# Patient Record
Sex: Female | Born: 1950 | Race: White | Hispanic: No | Marital: Married | Smoking: Never smoker
Health system: Southern US, Community
[De-identification: ages and names within clinical notes are randomized; demographics above are authoritative.]

## PROBLEM LIST (undated history)

## (undated) DIAGNOSIS — I10 Essential (primary) hypertension: Secondary | ICD-10-CM

---

## 1998-06-26 ENCOUNTER — Other Ambulatory Visit: Admission: RE | Admit: 1998-06-26 | Discharge: 1998-06-26 | Payer: Self-pay | Admitting: Obstetrics and Gynecology

## 1998-12-02 ENCOUNTER — Encounter: Payer: Self-pay | Admitting: Obstetrics and Gynecology

## 1998-12-02 ENCOUNTER — Ambulatory Visit (HOSPITAL_COMMUNITY): Admission: RE | Admit: 1998-12-02 | Discharge: 1998-12-02 | Payer: Self-pay | Admitting: Obstetrics and Gynecology

## 2000-06-15 ENCOUNTER — Ambulatory Visit (HOSPITAL_COMMUNITY): Admission: RE | Admit: 2000-06-15 | Discharge: 2000-06-15 | Payer: Self-pay | Admitting: Obstetrics and Gynecology

## 2000-06-15 ENCOUNTER — Encounter: Payer: Self-pay | Admitting: Obstetrics and Gynecology

## 2000-06-17 ENCOUNTER — Encounter: Payer: Self-pay | Admitting: Gynecology

## 2000-06-17 ENCOUNTER — Encounter: Admission: RE | Admit: 2000-06-17 | Discharge: 2000-06-17 | Payer: Self-pay | Admitting: Gynecology

## 2004-12-01 ENCOUNTER — Other Ambulatory Visit: Admission: RE | Admit: 2004-12-01 | Discharge: 2004-12-01 | Payer: Self-pay | Admitting: *Deleted

## 2005-12-02 ENCOUNTER — Other Ambulatory Visit: Admission: RE | Admit: 2005-12-02 | Discharge: 2005-12-02 | Payer: Self-pay | Admitting: *Deleted

## 2007-01-14 ENCOUNTER — Other Ambulatory Visit: Admission: RE | Admit: 2007-01-14 | Discharge: 2007-01-14 | Payer: Self-pay | Admitting: *Deleted

## 2009-09-04 ENCOUNTER — Encounter: Admission: RE | Admit: 2009-09-04 | Discharge: 2009-09-04 | Payer: Self-pay | Admitting: Sports Medicine

## 2009-10-23 ENCOUNTER — Encounter: Admission: RE | Admit: 2009-10-23 | Discharge: 2009-10-23 | Payer: Self-pay | Admitting: Sports Medicine

## 2013-09-17 ENCOUNTER — Emergency Department (HOSPITAL_COMMUNITY)
Admission: EM | Admit: 2013-09-17 | Discharge: 2013-09-17 | Disposition: A | Discharge status: Home / Self Care | Payer: BC Managed Care – PPO | Attending: Emergency Medicine | Admitting: Emergency Medicine

## 2013-09-17 ENCOUNTER — Encounter (HOSPITAL_COMMUNITY): Payer: Self-pay | Admitting: Emergency Medicine

## 2013-09-17 DIAGNOSIS — H579 Unspecified disorder of eye and adnexa: Secondary | ICD-10-CM | POA: Insufficient documentation

## 2013-09-17 DIAGNOSIS — Z880 Allergy status to penicillin: Secondary | ICD-10-CM | POA: Insufficient documentation

## 2013-09-17 DIAGNOSIS — I10 Essential (primary) hypertension: Secondary | ICD-10-CM | POA: Insufficient documentation

## 2013-09-17 DIAGNOSIS — H5789 Other specified disorders of eye and adnexa: Secondary | ICD-10-CM

## 2013-09-17 DIAGNOSIS — T7840XA Allergy, unspecified, initial encounter: Secondary | ICD-10-CM

## 2013-09-17 NOTE — ED Provider Notes (Signed)
CSN: [id number]     Arrival date & time 09/17/13  1112 History   This chart was scribed for non-physician practitioner Junius Finner, PA-C, working with Shanna Cisco, MD by Dorothey Baseman, ED Scribe. This patient was seen in room TR04C/TR04C and the patient's care was started at 11:44 AM.    Chief Complaint  Patient presents with  . Eye Pain   The history is provided by the patient. No language interpreter was used.   HPI Comments: Brenda Lloyd is a 62 y.o. female who presents to the Emergency Department complaining of an itching left eye pain with associated drainage, redness, and swelling onset 3 days ago that she states may be slightly improving. She reports that she was seen by her PCP for a viral infection, including flu-like symptoms, 6 days ago and that the eye pain presented after the infection. She states that she was seen 3 days ago for the eye symptoms and was told that she had conjunctivitis and was given bacitracin and permetherin. She reports associated fever of 102-103 degrees yesterday. She reports mild epistaxis and mild nausea yesterday that she believes may be due to the doxycycline. She denies any vision changes. She reports that she normally wears contact lenses, but has not been wearing them since onset of symptoms. She reports that she does have an ophthalmologist and follows up approximately once a year.   History reviewed. No pertinent past medical history. History reviewed. No pertinent past surgical history. History reviewed. No pertinent family history. History  Substance Use Topics  . Smoking status: Never Smoker   . Smokeless tobacco: Not on file  . Alcohol Use: Yes     Comment: occ   OB History   Grav Para Term Preterm Abortions TAB SAB Ect Mult Living                 Review of Systems  Constitutional: Positive for fever.  HENT: Positive for nosebleeds.   Eyes: Positive for pain and discharge. Negative for visual disturbance.  Gastrointestinal:  Positive for nausea.  All other systems reviewed and are negative.    Allergies  Amoxicillin; Ciprofloxacin; Penicillins; and Sulfa antibiotics  Home Medications  No current outpatient prescriptions on file.  Triage Vitals: BP 136/76  Pulse 87  Temp(Src) 98.1 F (36.7 C) (Oral)  Resp 16  SpO2 97%  Physical Exam  Nursing note and vitals reviewed. Constitutional: She is oriented to person, place, and time. She appears well-developed and well-nourished.  HENT:  Head: Normocephalic and atraumatic.  Eyes: EOM are normal. Pupils are equal, round, and reactive to light. Left eye exhibits chemosis and discharge ( scant, yellow). Left conjunctiva is injected. Left conjunctiva has a hemorrhage. Left eye exhibits normal extraocular motion.  Mild preorbital swelling to the left eye with erythema. No tenderness to palpation. No induration.   Neck: Normal range of motion.  Cardiovascular: Normal rate.   Pulmonary/Chest: Effort normal.  Musculoskeletal: Normal range of motion.  Neurological: She is alert and oriented to person, place, and time.  Skin: Skin is warm and dry.  Psychiatric: She has a normal mood and affect. Her behavior is normal.    ED Course  Procedures (including critical care time)  DIAGNOSTIC STUDIES: Oxygen Saturation is 97% on room air, normal by my interpretation.    COORDINATION OF CARE: 11:50AM- I consulted with Dr. Micheline Maze who agrees to consult with ophthalmology. Advised patient to follow up with her ophthalmologist, especially if there are any new or worsening  symptoms. Discussed treatment plan with patient at bedside and patient verbalized agreement.   12:29PM- Discussed that on consultation with ophthalmologist, Dr. Delaney Meigs, we both agree that symptoms are likely due to an allergic reaction to the antibiotic eye drops. Advised patient to discontinue use of the antibiotic eye drops. Advised patient to use chilled "Refresh" every 1-2 hours and to apply cold  compresses to the area to manage discomfort. Advised patient to follow up with Dr. Delaney Meigs tomorrow morning at 8:00AM. Discussed treatment plan with patient at bedside and patient verbalized agreement.    Labs Review Labs Reviewed - No data to display Imaging Review No results found.  MDM   1. Allergic reaction, initial encounter   2. Irritation of left eye     I personally performed the services described in this documentation, which was scribed in my presence. The recorded information has been reviewed and is accurate.   Junius Finner, PA-C 09/17/13 1631

## 2013-09-17 NOTE — ED Notes (Signed)
Pt c/o left eye pain, redness and swelling x 3 days; pt sts some drainage; pt sts some fevers

## 2013-09-17 NOTE — ED Provider Notes (Signed)
Medical screening examination/treatment/procedure(s) were conducted as a shared visit with non-physician practitioner(s) and myself.  I personally evaluated the patient during the encounter  Pt is a 62 y.o. female with Pmhx as above who presents with improving URI symptoms, now with L eye swelling/itching for several days not improved by abx gtt then ointment.  On PE, VSS, pt in NAD.  nml EOM.  PERRL.  She has severe chemosis with likely overlying subconjunctival hemorrhage.  Lids are edematous, but not infected appearing.  I believe she may had a chemical chemosis and pt reports multiple abx allergies.  Given severity, have asked PA Gershon Mussel to speak w/ opthalmology.  Close outpt f/u arranged.    1. Allergic reaction, initial encounter   2. Irritation of left eye       Shanna Cisco, MD 09/17/13 1717

## 2017-09-04 ENCOUNTER — Inpatient Hospital Stay (HOSPITAL_BASED_OUTPATIENT_CLINIC_OR_DEPARTMENT_OTHER)
Admission: EM | Admit: 2017-09-04 | Discharge: 2017-09-10 | DRG: 280 | Disposition: A | Discharge status: Rehabilitation Facility | Payer: Medicare Other | Attending: Cardiovascular Disease | Admitting: Cardiovascular Disease

## 2017-09-04 ENCOUNTER — Encounter (HOSPITAL_BASED_OUTPATIENT_CLINIC_OR_DEPARTMENT_OTHER): Payer: Self-pay | Admitting: Emergency Medicine

## 2017-09-04 ENCOUNTER — Emergency Department (HOSPITAL_BASED_OUTPATIENT_CLINIC_OR_DEPARTMENT_OTHER): Payer: Medicare Other

## 2017-09-04 DIAGNOSIS — F329 Major depressive disorder, single episode, unspecified: Secondary | ICD-10-CM | POA: Diagnosis present

## 2017-09-04 DIAGNOSIS — Z789 Other specified health status: Secondary | ICD-10-CM | POA: Diagnosis not present

## 2017-09-04 DIAGNOSIS — R011 Cardiac murmur, unspecified: Secondary | ICD-10-CM | POA: Diagnosis not present

## 2017-09-04 DIAGNOSIS — D62 Acute posthemorrhagic anemia: Secondary | ICD-10-CM | POA: Diagnosis not present

## 2017-09-04 DIAGNOSIS — I5181 Takotsubo syndrome: Secondary | ICD-10-CM | POA: Diagnosis not present

## 2017-09-04 DIAGNOSIS — I340 Nonrheumatic mitral (valve) insufficiency: Secondary | ICD-10-CM | POA: Diagnosis present

## 2017-09-04 DIAGNOSIS — E041 Nontoxic single thyroid nodule: Secondary | ICD-10-CM | POA: Diagnosis present

## 2017-09-04 DIAGNOSIS — I341 Nonrheumatic mitral (valve) prolapse: Secondary | ICD-10-CM | POA: Diagnosis present

## 2017-09-04 DIAGNOSIS — G9341 Metabolic encephalopathy: Secondary | ICD-10-CM | POA: Diagnosis not present

## 2017-09-04 DIAGNOSIS — R5381 Other malaise: Secondary | ICD-10-CM | POA: Diagnosis not present

## 2017-09-04 DIAGNOSIS — R740 Nonspecific elevation of levels of transaminase and lactic acid dehydrogenase [LDH]: Secondary | ICD-10-CM | POA: Diagnosis not present

## 2017-09-04 DIAGNOSIS — Z882 Allergy status to sulfonamides status: Secondary | ICD-10-CM | POA: Diagnosis not present

## 2017-09-04 DIAGNOSIS — I25110 Atherosclerotic heart disease of native coronary artery with unstable angina pectoris: Secondary | ICD-10-CM | POA: Diagnosis present

## 2017-09-04 DIAGNOSIS — R570 Cardiogenic shock: Secondary | ICD-10-CM

## 2017-09-04 DIAGNOSIS — I2510 Atherosclerotic heart disease of native coronary artery without angina pectoris: Secondary | ICD-10-CM | POA: Diagnosis not present

## 2017-09-04 DIAGNOSIS — R9431 Abnormal electrocardiogram [ECG] [EKG]: Secondary | ICD-10-CM | POA: Diagnosis not present

## 2017-09-04 DIAGNOSIS — I4581 Long QT syndrome: Secondary | ICD-10-CM | POA: Diagnosis present

## 2017-09-04 DIAGNOSIS — J40 Bronchitis, not specified as acute or chronic: Secondary | ICD-10-CM | POA: Diagnosis not present

## 2017-09-04 DIAGNOSIS — J9601 Acute respiratory failure with hypoxia: Secondary | ICD-10-CM | POA: Diagnosis not present

## 2017-09-04 DIAGNOSIS — Z7982 Long term (current) use of aspirin: Secondary | ICD-10-CM

## 2017-09-04 DIAGNOSIS — D72829 Elevated white blood cell count, unspecified: Secondary | ICD-10-CM | POA: Diagnosis not present

## 2017-09-04 DIAGNOSIS — J15211 Pneumonia due to Methicillin susceptible Staphylococcus aureus: Secondary | ICD-10-CM | POA: Diagnosis not present

## 2017-09-04 DIAGNOSIS — E785 Hyperlipidemia, unspecified: Secondary | ICD-10-CM | POA: Diagnosis present

## 2017-09-04 DIAGNOSIS — Z880 Allergy status to penicillin: Secondary | ICD-10-CM

## 2017-09-04 DIAGNOSIS — T4275XA Adverse effect of unspecified antiepileptic and sedative-hypnotic drugs, initial encounter: Secondary | ICD-10-CM | POA: Diagnosis not present

## 2017-09-04 DIAGNOSIS — I5021 Acute systolic (congestive) heart failure: Secondary | ICD-10-CM | POA: Diagnosis not present

## 2017-09-04 DIAGNOSIS — J810 Acute pulmonary edema: Secondary | ICD-10-CM | POA: Diagnosis present

## 2017-09-04 DIAGNOSIS — T82528A Displacement of other cardiac and vascular devices and implants, initial encounter: Secondary | ICD-10-CM

## 2017-09-04 DIAGNOSIS — I493 Ventricular premature depolarization: Secondary | ICD-10-CM | POA: Diagnosis not present

## 2017-09-04 DIAGNOSIS — Z791 Long term (current) use of non-steroidal anti-inflammatories (NSAID): Secondary | ICD-10-CM

## 2017-09-04 DIAGNOSIS — J690 Pneumonitis due to inhalation of food and vomit: Secondary | ICD-10-CM | POA: Diagnosis not present

## 2017-09-04 DIAGNOSIS — I509 Heart failure, unspecified: Secondary | ICD-10-CM

## 2017-09-04 DIAGNOSIS — I200 Unstable angina: Secondary | ICD-10-CM

## 2017-09-04 DIAGNOSIS — R569 Unspecified convulsions: Secondary | ICD-10-CM | POA: Diagnosis not present

## 2017-09-04 DIAGNOSIS — E876 Hypokalemia: Secondary | ICD-10-CM | POA: Diagnosis not present

## 2017-09-04 DIAGNOSIS — A4901 Methicillin susceptible Staphylococcus aureus infection, unspecified site: Secondary | ICD-10-CM | POA: Diagnosis not present

## 2017-09-04 DIAGNOSIS — I10 Essential (primary) hypertension: Secondary | ICD-10-CM | POA: Diagnosis not present

## 2017-09-04 DIAGNOSIS — R4182 Altered mental status, unspecified: Secondary | ICD-10-CM | POA: Diagnosis not present

## 2017-09-04 DIAGNOSIS — E7800 Pure hypercholesterolemia, unspecified: Secondary | ICD-10-CM

## 2017-09-04 DIAGNOSIS — D649 Anemia, unspecified: Secondary | ICD-10-CM | POA: Diagnosis present

## 2017-09-04 DIAGNOSIS — I959 Hypotension, unspecified: Secondary | ICD-10-CM | POA: Diagnosis not present

## 2017-09-04 DIAGNOSIS — I214 Non-ST elevation (NSTEMI) myocardial infarction: Principal | ICD-10-CM

## 2017-09-04 DIAGNOSIS — R739 Hyperglycemia, unspecified: Secondary | ICD-10-CM | POA: Diagnosis not present

## 2017-09-04 DIAGNOSIS — Z881 Allergy status to other antibiotic agents status: Secondary | ICD-10-CM

## 2017-09-04 DIAGNOSIS — Z79899 Other long term (current) drug therapy: Secondary | ICD-10-CM

## 2017-09-04 DIAGNOSIS — R0602 Shortness of breath: Secondary | ICD-10-CM

## 2017-09-04 HISTORY — DX: Essential (primary) hypertension: I10

## 2017-09-04 LAB — D-DIMER, QUANTITATIVE: D-Dimer, Quant: <0.27 ug/mL-FEU (ref 0.00–0.50)

## 2017-09-04 LAB — BASIC METABOLIC PANEL
Anion gap: 10 (ref 5–15)
BUN: 12 mg/dL (ref 6–20)
CHLORIDE: 105 mmol/L (ref 101–111)
CO2: 25 mmol/L (ref 22–32)
CREATININE: 0.7 mg/dL (ref 0.44–1.00)
Calcium: 9.7 mg/dL (ref 8.9–10.3)
GFR calc non Af Amer: >60 mL/min (ref >60)
Glucose, Bld: 154 mg/dL — ABNORMAL HIGH (ref 65–99)
POTASSIUM: 3.6 mmol/L (ref 3.5–5.1)
Sodium: 140 mmol/L (ref 135–145)

## 2017-09-04 LAB — HEPATIC FUNCTION PANEL
ALBUMIN: 4.1 g/dL (ref 3.5–5.0)
ALK PHOS: 82 U/L (ref 38–126)
ALT: 33 U/L (ref 14–54)
AST: 27 U/L (ref 15–41)
BILIRUBIN DIRECT: 0.1 mg/dL (ref 0.1–0.5)
Indirect Bilirubin: 0.7 mg/dL (ref 0.3–0.9)
Total Bilirubin: 0.8 mg/dL (ref 0.3–1.2)
Total Protein: 7.7 g/dL (ref 6.5–8.1)

## 2017-09-04 LAB — BRAIN NATRIURETIC PEPTIDE
B NATRIURETIC PEPTIDE 5: 43.2 pg/mL (ref 0.0–100.0)
B NATRIURETIC PEPTIDE 5: 147.3 pg/mL — AB (ref 0.0–100.0)

## 2017-09-04 LAB — TSH: TSH: 0.107 u[IU]/mL — AB (ref 0.350–4.500)

## 2017-09-04 LAB — PROTIME-INR
INR: 1.04
Prothrombin Time: 13.5 seconds (ref 11.4–15.2)

## 2017-09-04 LAB — CBC
HCT: 38.7 % (ref 36.0–46.0)
HEMOGLOBIN: 13.2 g/dL (ref 12.0–15.0)
MCH: 28.9 pg (ref 26.0–34.0)
MCHC: 34.1 g/dL (ref 30.0–36.0)
MCV: 84.7 fL (ref 78.0–100.0)
PLATELETS: 301 10*3/uL (ref 150–400)
RBC: 4.57 MIL/uL (ref 3.87–5.11)
RDW: 13.6 % (ref 11.5–15.5)
WBC: 13.6 10*3/uL — AB (ref 4.0–10.5)

## 2017-09-04 LAB — HEPARIN LEVEL (UNFRACTIONATED): Heparin Unfractionated: 0.42 IU/mL (ref 0.30–0.70)

## 2017-09-04 LAB — LIPID PANEL
Cholesterol: 187 mg/dL (ref 0–200)
HDL: 34 mg/dL — ABNORMAL LOW (ref >40)
LDL CALC: 133 mg/dL — AB (ref 0–99)
Total CHOL/HDL Ratio: 5.5 RATIO
Triglycerides: 99 mg/dL (ref <150)
VLDL: 20 mg/dL (ref 0–40)

## 2017-09-04 LAB — TROPONIN I
TROPONIN I: 3.15 ng/mL — AB (ref <0.03)
Troponin I: 1.21 ng/mL (ref <0.03)
Troponin I: 2.35 ng/mL (ref <0.03)

## 2017-09-04 LAB — LIPASE, BLOOD: Lipase: 42 U/L (ref 11–51)

## 2017-09-04 MED ORDER — SODIUM CHLORIDE 0.9 % IV SOLN: INTRAVENOUS | Status: AC

## 2017-09-04 MED ORDER — MORPHINE SULFATE (PF) 4 MG/ML IV SOLN
4.0000 mg | Freq: Once | INTRAVENOUS | Status: AC
  Administered 2017-09-04: 4 mg via INTRAVENOUS
  Filled 2017-09-04: qty 1

## 2017-09-04 MED ORDER — SODIUM CHLORIDE 0.9 % IV SOLN
Freq: Once | INTRAVENOUS | Status: AC
  Administered 2017-09-04: 16:00:00 via INTRAVENOUS

## 2017-09-04 MED ORDER — SODIUM CHLORIDE 0.9% FLUSH
3.0000 mL | Freq: Two times a day (BID) | INTRAVENOUS | Status: DC
  Administered 2017-09-04 – 2017-09-08 (×4): 3 mL via INTRAVENOUS

## 2017-09-04 MED ORDER — ASPIRIN EC 81 MG PO TBEC
81.0000 mg | DELAYED_RELEASE_TABLET | Freq: Every day | ORAL | Status: DC
  Administered 2017-09-07 – 2017-09-10 (×4): 81 mg via ORAL
  Filled 2017-09-04 (×5): qty 1

## 2017-09-04 MED ORDER — MORPHINE SULFATE (PF) 2 MG/ML IV SOLN
2.0000 mg | Freq: Once | INTRAVENOUS | Status: AC
  Administered 2017-09-04: 2 mg via INTRAVENOUS
  Filled 2017-09-04: qty 1

## 2017-09-04 MED ORDER — ASPIRIN 81 MG PO CHEW
324.0000 mg | CHEWABLE_TABLET | Freq: Once | ORAL | Status: AC
  Administered 2017-09-04: 324 mg via ORAL
  Filled 2017-09-04: qty 4

## 2017-09-04 MED ORDER — NITROGLYCERIN 2 % TD OINT
1.0000 [in_us] | TOPICAL_OINTMENT | Freq: Four times a day (QID) | TRANSDERMAL | Status: DC
  Administered 2017-09-04 – 2017-09-05 (×3): 1 [in_us] via TOPICAL
  Filled 2017-09-04: qty 30
  Filled 2017-09-04: qty 1

## 2017-09-04 MED ORDER — NITROGLYCERIN 0.4 MG SL SUBL
0.4000 mg | SUBLINGUAL_TABLET | SUBLINGUAL | Status: DC | PRN
  Administered 2017-09-04 – 2017-09-05 (×2): 0.4 mg via SUBLINGUAL
  Filled 2017-09-04 (×2): qty 1

## 2017-09-04 MED ORDER — METOPROLOL TARTRATE 5 MG/5ML IV SOLN
5.0000 mg | Freq: Once | INTRAVENOUS | Status: AC
  Administered 2017-09-04: 5 mg via INTRAVENOUS
  Filled 2017-09-04: qty 5

## 2017-09-04 MED ORDER — HEPARIN BOLUS VIA INFUSION
4000.0000 [IU] | Freq: Once | INTRAVENOUS | Status: AC
  Administered 2017-09-04: 4000 [IU] via INTRAVENOUS

## 2017-09-04 MED ORDER — ONDANSETRON HCL 4 MG/2ML IJ SOLN
4.0000 mg | Freq: Once | INTRAMUSCULAR | Status: AC
  Administered 2017-09-04: 4 mg via INTRAVENOUS
  Filled 2017-09-04: qty 2

## 2017-09-04 MED ORDER — HEPARIN (PORCINE) IN NACL 100-0.45 UNIT/ML-% IJ SOLN
950.0000 [IU]/h | INTRAMUSCULAR | Status: DC
  Administered 2017-09-04: 900 [IU]/h via INTRAVENOUS
  Filled 2017-09-04: qty 250

## 2017-09-04 MED ORDER — ATORVASTATIN CALCIUM 80 MG PO TABS
80.0000 mg | ORAL_TABLET | Freq: Every day | ORAL | Status: DC
  Administered 2017-09-06 – 2017-09-09 (×4): 80 mg via ORAL
  Filled 2017-09-04 (×4): qty 1

## 2017-09-04 NOTE — ED Provider Notes (Addendum)
[facility] Provider [facility]   CSN: [id number] Arrival date & time: 09/04/17  1449     History   Chief Complaint Chief Complaint  Patient presents with  . Chest Pain    HPI Brenda Lloyd is a 66 y.o. female.  HPI Patient states that a proximally 6 months ago she started getting central chest pains intermittently with exertion. She reports that she would also get some exertional shortness of breath. She reports over time she has gotten increasingly short of breath. Typically she would take walks with her grandson and telling stories. Now she reports that she can barely make the walk-in she can't talk at the same time because she becomes so short of breath. For 2 weeks now she has been getting central chest pain with any exertion. Patient empirically started taking a daily aspirin a couple months ago. It's an aching and burning quality in the center of her chest. Her dyspnea on exertion has significantly increased as well. She reports now activities that used to be easier becoming difficult due to dyspnea. She denies any lower extremity calf pain or lower extremity swelling. No cough. No fever. For 2 weeks this has become very pronounced. Today, she and her husband and son were at a volunteer baseball event for several hours in the morning and afternoon. She reports she barely made it through and was having chest pain. On the way home in the car she Lorin Picket is very nauseated in severe pain and had to stop at gas station to try to vomit. At that point, she determined it was time to come to the hospital. Patient has avoided any medical care for years due to fear of hospitals and medical procedures. She has no smoking history. Family history essentially negative. Parents lived in older years without coronary artery disease. She has a brother who had CVA in his 17s. Past Medical History:  Diagnosis Date  . Hypertension     There are no active problems to display for this  patient.   History reviewed. No pertinent surgical history.  OB History    No data available       Home Medications    Prior to Admission medications   Medication Sig Start Date End Date Taking? Authorizing Provider  aspirin EC 81 MG tablet Take 81 mg by mouth daily.   Yes [provider]    Family History No family history on file.  Social History Social History  Substance Use Topics  . Smoking status: Never Smoker  . Smokeless tobacco: Never Used  . Alcohol use Yes     Comment: occ     Allergies   Amoxicillin; Ciprofloxacin; Penicillins; and Sulfa antibiotics   Review of Systems Review of Systems 10 Systems reviewed and are negative for acute change except as noted in the HPI.   Physical Exam Updated Vital Signs BP 123/88   Pulse (!) 113   Temp 98.2 F (36.8 C) (Oral)   Resp (!) 23   Ht  (1.753 m)   Wt 74.8 kg (165 lb)   SpO2 (!) 89%   BMI 24.37 kg/m   Physical Exam  Constitutional: She appears well-developed and well-nourished.  Mild tachypnea.  HENT:  Head: Normocephalic and atraumatic.  Eyes: EOM are normal.  Neck: Neck supple. No thyromegaly present.  Pulmonary/Chest: No stridor.     ED Treatments / Results  Labs (all labs ordered are listed, but only abnormal results are displayed) Labs Reviewed  BASIC METABOLIC  PANEL - Abnormal; Notable for the following:       Result Value   Glucose, Bld 154 (*)    All other components within normal limits  CBC - Abnormal; Notable for the following:    WBC 13.6 (*)    All other components within normal limits  TROPONIN I - Abnormal; Notable for the following:    Troponin I 1.21 (*)    All other components within normal limits  HEPATIC FUNCTION PANEL  LIPASE, BLOOD  PROTIME-INR  D-DIMER, QUANTITATIVE (NOT AT [facility])  BRAIN NATRIURETIC PEPTIDE  TSH  HEMOGLOBIN A1C  HEPARIN LEVEL (UNFRACTIONATED)    EKG  EKG Interpretation  Date/Time:  Saturday September 04 2017 14:54:48  EDT Ventricular Rate:  100 PR Interval:  162 QRS Duration: 94 QT Interval:  362 QTC Calculation: 466 R Axis:   19 Text Interpretation:  Sinus rhythm with occasional Premature ventricular complexes Anteroseptal infarct , age undetermined Abnormal ECG Confirmed by Arby Barrette [phone]) on 09/04/2017 3:20:22 PM       Radiology Dg Chest 2 View  Result Date: 09/04/2017 CLINICAL DATA:  66 year old female with progressive central chest pain and shortness of breath EXAM: CHEST  2 VIEW COMPARISON:  None. FINDINGS: The lungs are clear and negative for focal airspace consolidation, pulmonary edema or suspicious pulmonary nodule. No pleural effusion or pneumothorax. Cardiac and mediastinal contours are within normal limits. No acute fracture or lytic or blastic osseous lesions. The visualized upper abdominal bowel gas pattern is unremarkable. IMPRESSION: Negative chest x-ray. Electronically Signed   By: Malachy Moan M.D.   On: 09/04/2017 15:29    Procedures Procedures (including critical care time) CRITICAL CARE Performed by: Arby Barrette   Total critical care time: 45 minutes  Critical care time was exclusive of separately billable procedures and treating other patients.  Critical care was necessary to treat or prevent imminent or life-threatening deterioration.  Critical care was time spent personally by me on the following activities: development of treatment plan with patient and/or surrogate as well as nursing, discussions with consultants, evaluation of patient's response to treatment, examination of patient, obtaining history from patient or surrogate, ordering and performing treatments and interventions, ordering and review of laboratory studies, ordering and review of radiographic studies, pulse oximetry and re-evaluation of patient's condition. Medications Ordered in ED Medications  heparin ADULT infusion 100 units/mL (25000 units/254mL sodium chloride 0.45%) (900 Units/hr  Intravenous New Bag/Given 09/04/17 1615)  nitroGLYCERIN (NITROSTAT) SL tablet 0.4 mg (0.4 mg Sublingual Given 09/04/17 1615)  0.9 %  sodium chloride infusion ( Intravenous New Bag/Given 09/04/17 1540)  aspirin chewable tablet 324 mg (324 mg Oral Given 09/04/17 1543)  heparin bolus via infusion 4,000 Units (4,000 Units Intravenous Bolus from Bag 09/04/17 1553)     Initial Impression / Assessment and Plan / ED Course  I have reviewed the triage vital signs and the nursing notes.  Pertinent labs & imaging results that were available during my care of the patient were reviewed by me and considered in my medical decision making (see chart for details).     Consult: Cardiology Dr. Purvis Sheffield. He agrees with heparin and transfer for definitive cardiology management at [facility] facility.   Recheck: (16:40) patient reports feels much better than has in weeks. Final Clinical Impressions(s) / ED Diagnoses   Final diagnoses:  Unstable angina (HCC)  NSTEMI (non-ST elevated myocardial infarction) [facility])  Patient has had increasing chest pain for 6 months duration. At this point she has gained chest pain  with minimal exertion as well as significant dyspnea. History consistent with unstable angina. Patient does have elevated troponin consistent with NSTEMI. Heparin and aspirin administered. Patient we [facility] for definitive management by cardiology.  New Prescriptions New Prescriptions   No medications on file     Arby BarrettePfeiffer, Suly Vukelich, MD 09/04/17 1627    Arby BarrettePfeiffer, Melodee Lupe, MD 09/04/17 [phone]

## 2017-09-04 NOTE — ED Notes (Signed)
Lab called with a troponin of 3.15. Dr. Lowella BandyPheiffer aware and no further orders received.

## 2017-09-04 NOTE — ED Notes (Signed)
ED Provider at bedside. 

## 2017-09-04 NOTE — ED Notes (Signed)
Pt placed back on cardiac monitor and auto VS

## 2017-09-04 NOTE — ED Triage Notes (Signed)
Chest pain x2-3 days pt reports it got worse around 1315. Pain in the center of chest no radiation. Sob, diaphoretic and nausea. Denies heart hx. EKG being taken at triage.

## 2017-09-04 NOTE — ED Notes (Signed)
Troponin 1.21 , results given to ED MD

## 2017-09-04 NOTE — ED Notes (Signed)
Pt on cardiac monitor and auto VS. Family at bedside.

## 2017-09-04 NOTE — ED Notes (Signed)
[facility] is [facility].  Received Troponin result of 3.15.  Spoke with Judeth CornfieldStephanie from [address] and informed her that pt's troponin is 3.15.  She stated that [facility] already called her that patient is on her way.

## 2017-09-04 NOTE — ED Notes (Signed)
Patient placed on 2 liter of O2 , pulse ox at 88%

## 2017-09-04 NOTE — Progress Notes (Signed)
ANTICOAGULATION CONSULT NOTE - Follow-Up Consult  Pharmacy Consult for Heparin Indication: chest pain/ACS  Patient Measurements: Height: 5\' 9"  (175.3 cm) Weight: 184 lb 4.8 oz (83.6 kg) IBW/kg (Calculated) : 66.2 Heparin Dosing Weight: 75 kg  Vital Signs: Temp: 97.8 F (36.6 C) (09/08 2028) Temp Source: Oral (09/08 2028) BP: 112/75 (09/08 2028) Pulse Rate: 82 (09/08 2028)  Labs:  Recent Labs  09/04/17 1500 09/04/17 1857 09/04/17 2105  HGB 13.2  --   --   HCT 38.7  --   --   PLT 301  --   --   LABPROT 13.5  --   --   INR 1.04  --   --   HEPARINUNFRC  --   --  0.42  CREATININE 0.70  --   --   TROPONINI 1.21* 3.15*  --     Estimated Creatinine Clearance: 79.9 mL/min (by C-G formula based on SCr of 0.7 mg/dL).   Assessment: Brenda Lloyd [facility]. Pharmacy is consulted to start IV heparin for ACS sx.  Heparin level this evening is therapeutic (HL 0.42, goal of 0.3-0.7). No bleeding noted at this time.   Goal of Therapy:  Heparin level 0.3-0.7 units/ml Monitor platelets by anticoagulation protocol: Yes   Plan:  1. Continue Heparin at 900 units/hr (9 ml/hr) 2. Will continue to monitor for any signs/symptoms of bleeding and will follow up with heparin level in the a.m to confirm therapeutic  Thank you for allowing pharmacy to be a part of this patient's care.  Georgina PillionElizabeth Javarie Crisp, PharmD, BCPS Clinical Pharmacist Pager: [phone] 09/04/2017 9:50 PM

## 2017-09-04 NOTE — Progress Notes (Signed)
ANTICOAGULATION CONSULT NOTE - Initial Consult  Pharmacy Consult for Heparin Indication: chest pain/ACS  Allergies  Allergen Reactions  . Amoxicillin Anaphylaxis  . Ciprofloxacin   . Penicillins   . Sulfa Antibiotics     Patient Measurements: Height: 5\' 9"  (175.3 cm) Weight: 165 lb (74.8 kg) IBW/kg (Calculated) : 66.2 Heparin Dosing Weight: 75 kg  Vital Signs: Temp: 98.2 F (36.8 C) (09/08 1455) Temp Source: Oral (09/08 1455) BP: 129/94 (09/08 1530) Pulse Rate: 110 (09/08 1530)  Labs:  Recent Labs  09/04/17 1500  HGB 13.2  HCT 38.7  PLT 301  CREATININE 0.70    Estimated Creatinine Clearance: 72.3 mL/min (by C-G formula based on SCr of 0.7 mg/dL).   Medical History: Past Medical History:  Diagnosis Date  . Hypertension     Assessment: 89 YOF [facility]. pharmacy is consulted to start IV heparin. Baseline CBC wnl  Goal of Therapy:  Heparin level 0.3-0.7 units/ml Monitor platelets by anticoagulation protocol: Yes   Plan:  Heparin bolus 4000 units x 1 Heparin infusion 900 units/hr F/u 6 hr heparin level at 2200 Daily heparin level and CBC F/u plans for ACS w/u  Bayard HuggerMei Kimberlyann Hollar, PharmD, BCPS  Clinical Pharmacist  Pager: [phone]   09/04/2017,3:38 PM

## 2017-09-04 NOTE — ED Notes (Addendum)
Patient stated that lately, she was unable to walk a week ago due to short of breath and chest hurting.

## 2017-09-04 NOTE — ED Notes (Signed)
States," I feel much better"  

## 2017-09-04 NOTE — ED Notes (Signed)
C/O of feeling" SOB" O2 sat 88%RA, placed on O2 at 2liters via [place]. ED MD informed.

## 2017-09-04 NOTE — Progress Notes (Signed)
   09/04/17 2028  Vitals  Temp 97.8 F (36.6 C)  Temp Source Oral  BP 112/75  BP Location Right Arm  BP Method Automatic  Patient Position (if appropriate) Lying  Pulse Rate 82  Pulse Rate Source Dinamap  Resp 18  Oxygen Therapy  SpO2 95 %  O2 Device Nasal Cannula  O2 Flow Rate (L/min) 2 L/min  Pain Assessment  Pain Assessment No/denies pain  Height and Weight  Height 5\' 9"  (1.753 m)  Weight 83.6 kg (184 lb 4.8 oz)  Type of Scale Used Standing  BSA (Calculated - sq m) 2.02 sq meters  BMI (Calculated) 27.2  Weight in (lb) to have BMI = 25 168.9   [facility]. Pt denies any pain. States "I feel so much better." Oncall Cardiology made aware of pt arrival, as well as positive troponin. Per MD, he will come up to see the pt. Pt given water and saltine crackers. Family at bedside. Will monitor.

## 2017-09-04 NOTE — ED Notes (Signed)
Patient transported to X-ray 

## 2017-09-04 NOTE — H&P (Addendum)
CARDIOLOGY INPATIENT HISTORY AND PHYSICAL EXAMINATION NOTE  Patient ID: Brenda Lloyd MRN: [id number], DOB/AGE: [birth year]   Admit date: 09/04/2017   Primary Physician: [name], MD Primary Cardiologist: new  Reason for admission: chest pain   HPI: This is a 66 y.o.white female without known history of CAD and on no meds presented with chest pain [facility].  Patient had first episode of chest pain 6 mo ago and then has been having accelerated angina for the last 5 days with exertion. It was burning like pain, 6/10 in intensity, without radiation. She started having pain around 1:30 pm today when she was pushing a wheel chair and running. The pain improved with NTG. Her initial trop was elevated on arrival and then increased to 3.15. She is currently chest pain free. It was associated with nausea, presyncope, nausea/vomiting. It improved after 1 hr.   Problem List: Past Medical History:  Diagnosis Date  . Hypertension     History reviewed. No pertinent surgical history.   Allergies:  Allergies  Allergen Reactions  . Amoxicillin Anaphylaxis  . Ciprofloxacin   . Penicillins   . Sulfa Antibiotics      Home Medications Current Facility-Administered Medications  Medication Dose Route Frequency Provider Last Rate Last Dose  . 0.9 %  sodium chloride infusion   Intravenous STAT Arby Barrette, MD      . Melene Muller ON 09/05/2017] aspirin EC tablet 81 mg  81 mg Oral Daily Timoteo Expose T, MD      . Melene Muller ON 09/05/2017] atorvastatin (LIPITOR) tablet 80 mg  80 mg Oral q1800 Timoteo Expose T, MD      . heparin ADULT infusion 100 units/mL (25000 units/269mL sodium chloride 0.45%)  900 Units/hr Intravenous Continuous Robinette Haines, RPH 9 mL/hr at 09/04/17 1615 900 Units/hr at 09/04/17 1615  . nitroGLYCERIN (NITROGLYN) 2 % ointment 1 inch  1 inch Topical Q6H Arby Barrette, MD   1 inch at 09/04/17 1704  . nitroGLYCERIN (NITROSTAT) SL tablet 0.4 mg  0.4 mg Sublingual Q5  min PRN Arby Barrette, MD   0.4 mg at 09/04/17 1615  . sodium chloride flush (NS) 0.9 % injection 3 mL  3 mL Intravenous Q12H Joellyn Rued, MD         No family history on file.   Social History   Social History  . Marital status: Married    Spouse name: N/A  . Number of children: N/A  . Years of education: N/A   Occupational History  . Not on file.   Social History Main Topics  . Smoking status: Never Smoker  . Smokeless tobacco: Never Used  . Alcohol use Yes     Comment: occ  . Drug use: No  . Sexual activity: Not on file   Other Topics Concern  . Not on file   Social History Narrative  . No narrative on file     Review of Systems: General: negative for chills, fever, night sweats or weight changes.  Cardiovascular: chest pain, dyspnea negative for dyspnea on exertion, edema, orthopnea, palpitations, paroxysmal nocturnal dyspnea Dermatological: negative for rash Respiratory: negative for cough or wheezing Urologic: negative for hematuria Abdominal: nausea, vomiting Neurologic: negative for visual changes, syncope, or dizziness Endocrine: no diabetes, no hypothyroidism Immunological: no lymph adenopathy Psych: non homicidal/suicidal  Physical Exam: Vitals: BP 112/75 (BP Location: Right Arm)   Pulse 82   Temp 97.8 F (36.6 C) (Oral)   Resp 18   Ht  5\' 9"  (1.753 m)   Wt 83.6 kg (184 lb 4.8 oz)   SpO2 95%   BMI 27.22 kg/m  General: not in acute distress Neck: JVP flat, neck supple Heart: regular rate and rhythm, S1, S2, S3 grade II early systolic murmur at PMI in left lateral position, no clicks  Lungs: CTAB  GI: non tender, non distended, bowel sounds present Extremities: no edema Neuro: AAO x 3  Psych: normal affect, no anxiety   Labs:   Results for orders placed or performed during the hospital encounter of 09/04/17 (from the past 24 hour(s))  Basic metabolic panel     Status: Abnormal   Collection Time: 09/04/17  3:00 PM  Result Value Ref  Range   Sodium 140 135 - 145 mmol/L   Potassium 3.6 3.5 - 5.1 mmol/L   Chloride 105 101 - 111 mmol/L   CO2 25 22 - 32 mmol/L   Glucose, Bld 154 (H) 65 - 99 mg/dL   BUN 12 6 - 20 mg/dL   Creatinine, Ser 1.610.70 0.44 - 1.00 mg/dL   Calcium 9.7 8.9 - 09.610.3 mg/dL   GFR calc non Af Amer >60 >60 mL/min   GFR calc Af Amer >60 >60 mL/min   Anion gap 10 5 - 15  CBC     Status: Abnormal   Collection Time: 09/04/17  3:00 PM  Result Value Ref Range   WBC 13.6 (H) 4.0 - 10.5 K/uL   RBC 4.57 3.87 - 5.11 MIL/uL   Hemoglobin 13.2 12.0 - 15.0 g/dL   HCT 04.538.7 40.936.0 - 81.146.0 %   MCV 84.7 78.0 - 100.0 fL   MCH 28.9 26.0 - 34.0 pg   MCHC 34.1 30.0 - 36.0 g/dL   RDW 91.413.6 78.211.5 - 95.615.5 %   Platelets 301 150 - 400 K/uL  Troponin I     Status: Abnormal   Collection Time: 09/04/17  3:00 PM  Result Value Ref Range   Troponin I 1.21 (HH) <0.03 ng/mL  Hepatic function panel     Status: None   Collection Time: 09/04/17  3:00 PM  Result Value Ref Range   Total Protein 7.7 6.5 - 8.1 g/dL   Albumin 4.1 3.5 - 5.0 g/dL   AST 27 15 - 41 U/L   ALT 33 14 - 54 U/L   Alkaline Phosphatase 82 38 - 126 U/L   Total Bilirubin 0.8 0.3 - 1.2 mg/dL   Bilirubin, Direct 0.1 0.1 - 0.5 mg/dL   Indirect Bilirubin 0.7 0.3 - 0.9 mg/dL  Lipase, blood     Status: None   Collection Time: 09/04/17  3:00 PM  Result Value Ref Range   Lipase 42 11 - 51 U/L  Protime-INR     Status: None   Collection Time: 09/04/17  3:00 PM  Result Value Ref Range   Prothrombin Time 13.5 11.4 - 15.2 seconds   INR 1.04   D-dimer, quantitative     Status: None   Collection Time: 09/04/17  3:00 PM  Result Value Ref Range   D-Dimer, Quant <0.27 0.00 - 0.50 ug/mL-FEU  Brain natriuretic peptide     Status: None   Collection Time: 09/04/17  3:00 PM  Result Value Ref Range   B Natriuretic Peptide 43.2 0.0 - 100.0 pg/mL  Troponin I     Status: Abnormal   Collection Time: 09/04/17  6:57 PM  Result Value Ref Range   Troponin I 3.15 (HH) <0.03 ng/mL    Heparin level (unfractionated)  Status: None   Collection Time: 09/04/17  9:05 PM  Result Value Ref Range   Heparin Unfractionated 0.42 0.30 - 0.70 IU/mL     Radiology/Studies: Dg Chest 2 View  Result Date: 09/04/2017 CLINICAL DATA:  66 year old female with progressive central chest pain and shortness of breath EXAM: CHEST  2 VIEW COMPARISON:  None. FINDINGS: The lungs are clear and negative for focal airspace consolidation, pulmonary edema or suspicious pulmonary nodule. No pleural effusion or pneumothorax. Cardiac and mediastinal contours are within normal limits. No acute fracture or lytic or blastic osseous lesions. The visualized upper abdominal bowel gas pattern is unremarkable. IMPRESSION: Negative chest x-ray. Electronically Signed   By: Malachy Moan M.D.   On: 09/04/2017 15:29    EKG: normal sinus rhythm with Q waves anteriorly, mild ST elevation  Echo: pending  Cardiac cath: pending  Medical decision making:  Discussed care with the patient Discussed care with the physician on the phone Reviewed labs and imaging personally Reviewed prior records  ASSESSMENT AND PLAN:  This is a 66 y.o. female with no prior history presented with chest pain with exertion and elevated troponin.     Active Problems:   NSTEMI (non-ST elevated myocardial infarction) (HCC)   Systolic murmur  NSTEMI TIMI score 2  Cycle troponin, serial EKGs prn, lipid panel, TSH, HbA1c, echocardiogram in the AM IV heparin, aspirin, high dose statin (lipitor 80 mg daily), Consider cardiac catheterization   Systolic murmur at PMI no radiation to axilla, early systolic likely flow, however h/o MVP and will evaluate with TTE   Signed, Joellyn Rued, MD MS 09/04/2017, 9:52 PM

## 2017-09-05 ENCOUNTER — Inpatient Hospital Stay (HOSPITAL_COMMUNITY): Payer: Medicare Other | Admitting: Certified Registered Nurse Anesthetist

## 2017-09-05 ENCOUNTER — Inpatient Hospital Stay (HOSPITAL_COMMUNITY): Payer: Medicare Other

## 2017-09-05 ENCOUNTER — Encounter (HOSPITAL_COMMUNITY)
Admission: EM | Disposition: A | Discharge status: Rehabilitation Facility | Payer: Self-pay | Source: Home / Self Care | Attending: Cardiovascular Disease

## 2017-09-05 ENCOUNTER — Encounter (HOSPITAL_COMMUNITY): Payer: Self-pay | Admitting: *Deleted

## 2017-09-05 DIAGNOSIS — R570 Cardiogenic shock: Secondary | ICD-10-CM

## 2017-09-05 DIAGNOSIS — R569 Unspecified convulsions: Secondary | ICD-10-CM

## 2017-09-05 DIAGNOSIS — J9601 Acute respiratory failure with hypoxia: Secondary | ICD-10-CM | POA: Diagnosis not present

## 2017-09-05 DIAGNOSIS — I200 Unstable angina: Secondary | ICD-10-CM

## 2017-09-05 DIAGNOSIS — I2510 Atherosclerotic heart disease of native coronary artery without angina pectoris: Secondary | ICD-10-CM

## 2017-09-05 DIAGNOSIS — J810 Acute pulmonary edema: Secondary | ICD-10-CM | POA: Diagnosis present

## 2017-09-05 DIAGNOSIS — Z789 Other specified health status: Secondary | ICD-10-CM

## 2017-09-05 DIAGNOSIS — R4182 Altered mental status, unspecified: Secondary | ICD-10-CM

## 2017-09-05 HISTORY — PX: LEFT HEART CATH AND CORONARY ANGIOGRAPHY: CATH118249

## 2017-09-05 LAB — COMPREHENSIVE METABOLIC PANEL
ALBUMIN: 3.8 g/dL (ref 3.5–5.0)
ALK PHOS: 85 U/L (ref 38–126)
ALT: 31 U/L (ref 14–54)
ANION GAP: 13 (ref 5–15)
AST: 38 U/L (ref 15–41)
BUN: 8 mg/dL (ref 6–20)
CALCIUM: 8.8 mg/dL — AB (ref 8.9–10.3)
CO2: 19 mmol/L — AB (ref 22–32)
Chloride: 107 mmol/L (ref 101–111)
Creatinine, Ser: 0.61 mg/dL (ref 0.44–1.00)
GFR calc non Af Amer: >60 mL/min (ref >60)
GLUCOSE: 154 mg/dL — AB (ref 65–99)
POTASSIUM: 3.5 mmol/L (ref 3.5–5.1)
SODIUM: 139 mmol/L (ref 135–145)
TOTAL PROTEIN: 7 g/dL (ref 6.5–8.1)
Total Bilirubin: 1.5 mg/dL — ABNORMAL HIGH (ref 0.3–1.2)

## 2017-09-05 LAB — URINALYSIS, ROUTINE W REFLEX MICROSCOPIC
BACTERIA UA: NONE SEEN
BILIRUBIN URINE: NEGATIVE
Glucose, UA: NEGATIVE mg/dL
HGB URINE DIPSTICK: NEGATIVE
Ketones, ur: 20 mg/dL — AB
Leukocytes, UA: NEGATIVE
NITRITE: NEGATIVE
PROTEIN: 30 mg/dL — AB
pH: 5 (ref 5.0–8.0)

## 2017-09-05 LAB — POCT I-STAT 3, ART BLOOD GAS (G3+)
ACID-BASE EXCESS: 2 mmol/L (ref 0.0–2.0)
BICARBONATE: 25.3 mmol/L (ref 20.0–28.0)
Bicarbonate: 25.7 mmol/L (ref 20.0–28.0)
O2 SAT: 94 %
O2 Saturation: 99 %
PCO2 ART: 47.2 mmHg (ref 32.0–48.0)
PH ART: 7.352 (ref 7.350–7.450)
Patient temperature: 102.2
TCO2: 26 mmol/L (ref 22–32)
TCO2: 27 mmol/L (ref 22–32)
pCO2 arterial: 34 mmHg (ref 32.0–48.0)
pH, Arterial: 7.48 — ABNORMAL HIGH (ref 7.350–7.450)
pO2, Arterial: 108 mmHg (ref 83.0–108.0)
pO2, Arterial: 81 mmHg — ABNORMAL LOW (ref 83.0–108.0)

## 2017-09-05 LAB — POCT I-STAT, CHEM 8
BUN: 10 mg/dL (ref 6–20)
CALCIUM ION: 1.15 mmol/L (ref 1.15–1.40)
CHLORIDE: 105 mmol/L (ref 101–111)
CREATININE: 0.6 mg/dL (ref 0.44–1.00)
GLUCOSE: 137 mg/dL — AB (ref 65–99)
HCT: 34 % — ABNORMAL LOW (ref 36.0–46.0)
Hemoglobin: 11.6 g/dL — ABNORMAL LOW (ref 12.0–15.0)
Potassium: 3.8 mmol/L (ref 3.5–5.1)
SODIUM: 140 mmol/L (ref 135–145)
TCO2: 26 mmol/L (ref 22–32)

## 2017-09-05 LAB — HEMOGLOBIN A1C
HEMOGLOBIN A1C: 5.7 % — AB (ref 4.8–5.6)
Hgb A1c MFr Bld: 5.6 % (ref 4.8–5.6)
Mean Plasma Glucose: 114.02 mg/dL
Mean Plasma Glucose: 116.89 mg/dL

## 2017-09-05 LAB — TSH: TSH: 0.102 u[IU]/mL — ABNORMAL LOW (ref 0.350–4.500)

## 2017-09-05 LAB — PROTIME-INR
INR: 1.09
Prothrombin Time: 14 seconds (ref 11.4–15.2)

## 2017-09-05 LAB — MRSA PCR SCREENING: MRSA BY PCR: NEGATIVE

## 2017-09-05 LAB — TROPONIN I
TROPONIN I: 3.67 ng/mL — AB (ref <0.03)
TROPONIN I: 1.88 ng/mL — AB (ref <0.03)

## 2017-09-05 LAB — T4, FREE: FREE T4: 1.12 ng/dL (ref 0.61–1.12)

## 2017-09-05 LAB — GLUCOSE, CAPILLARY: GLUCOSE-CAPILLARY: 141 mg/dL — AB (ref 65–99)

## 2017-09-05 LAB — HEPARIN LEVEL (UNFRACTIONATED): HEPARIN UNFRACTIONATED: 0.15 [IU]/mL — AB (ref 0.30–0.70)

## 2017-09-05 SURGERY — LEFT HEART CATH AND CORONARY ANGIOGRAPHY
Anesthesia: LOCAL

## 2017-09-05 MED ORDER — VANCOMYCIN HCL 10 G IV SOLR
1500.0000 mg | Freq: Once | INTRAVENOUS | Status: AC
  Administered 2017-09-05: 1500 mg via INTRAVENOUS
  Filled 2017-09-05: qty 1500

## 2017-09-05 MED ORDER — LIDOCAINE HCL (PF) 1 % IJ SOLN
INTRAMUSCULAR | Status: AC
  Filled 2017-09-05: qty 30

## 2017-09-05 MED ORDER — DEXTROSE 5 % IV SOLN
2.0000 ug/min | INTRAVENOUS | Status: DC
  Administered 2017-09-05: 32 ug/min via INTRAVENOUS
  Administered 2017-09-05: 30 ug/min via INTRAVENOUS
  Administered 2017-09-05: 3 ug/min via INTRAVENOUS
  Filled 2017-09-05 (×4): qty 4

## 2017-09-05 MED ORDER — SODIUM CHLORIDE 0.9 % IV SOLN: 250.0000 mL | INTRAVENOUS | Status: DC | PRN

## 2017-09-05 MED ORDER — ETOMIDATE 2 MG/ML IV SOLN
INTRAVENOUS | Status: DC | PRN
  Administered 2017-09-05: 10 mg via INTRAVENOUS

## 2017-09-05 MED ORDER — AZTREONAM 2 G IJ SOLR
2.0000 g | Freq: Three times a day (TID) | INTRAMUSCULAR | Status: DC
  Administered 2017-09-06 – 2017-09-07 (×5): 2 g via INTRAVENOUS
  Filled 2017-09-05 (×6): qty 2

## 2017-09-05 MED ORDER — ORAL CARE MOUTH RINSE
15.0000 mL | Freq: Four times a day (QID) | OROMUCOSAL | Status: DC
  Administered 2017-09-05 – 2017-09-09 (×11): 15 mL via OROMUCOSAL

## 2017-09-05 MED ORDER — LORAZEPAM 2 MG/ML IJ SOLN
INTRAMUSCULAR | Status: AC
  Filled 2017-09-05: qty 1

## 2017-09-05 MED ORDER — FUROSEMIDE 10 MG/ML IJ SOLN
40.0000 mg | Freq: Once | INTRAMUSCULAR | Status: AC
  Administered 2017-09-05: 40 mg via INTRAVENOUS
  Filled 2017-09-05: qty 4

## 2017-09-05 MED ORDER — FENTANYL 2500MCG IN NS 250ML (10MCG/ML) PREMIX INFUSION
0.0000 ug/h | INTRAVENOUS | Status: DC
  Administered 2017-09-05: 25 ug/h via INTRAVENOUS
  Administered 2017-09-05: 100 ug/h via INTRAVENOUS
  Administered 2017-09-05: 180 ug/h via INTRAVENOUS
  Administered 2017-09-05 (×2): 50 ug/h via INTRAVENOUS
  Filled 2017-09-05 (×2): qty 250

## 2017-09-05 MED ORDER — IOPAMIDOL (ISOVUE-370) INJECTION 76%
INTRAVENOUS | Status: DC | PRN
  Administered 2017-09-05: 75 mL via INTRA_ARTERIAL

## 2017-09-05 MED ORDER — MORPHINE SULFATE (PF) 2 MG/ML IV SOLN
2.0000 mg | Freq: Once | INTRAVENOUS | Status: AC
  Administered 2017-09-05: 2 mg via INTRAVENOUS
  Filled 2017-09-05: qty 1

## 2017-09-05 MED ORDER — LORAZEPAM 2 MG/ML IJ SOLN
2.0000 mg | Freq: Once | INTRAMUSCULAR | Status: AC
  Administered 2017-09-05: 2 mg via INTRAVENOUS

## 2017-09-05 MED ORDER — SODIUM CHLORIDE 0.9 % IV SOLN
500.0000 mg | Freq: Two times a day (BID) | INTRAVENOUS | Status: DC
  Administered 2017-09-05 – 2017-09-08 (×6): 500 mg via INTRAVENOUS
  Filled 2017-09-05 (×6): qty 5

## 2017-09-05 MED ORDER — MIDAZOLAM HCL 2 MG/2ML IJ SOLN
INTRAMUSCULAR | Status: AC
  Administered 2017-09-05: 1 mg
  Filled 2017-09-05: qty 2

## 2017-09-05 MED ORDER — IPRATROPIUM-ALBUTEROL 0.5-2.5 (3) MG/3ML IN SOLN
RESPIRATORY_TRACT | Status: AC
  Administered 2017-09-05: 3 mL via RESPIRATORY_TRACT
  Filled 2017-09-05: qty 3

## 2017-09-05 MED ORDER — MIDAZOLAM HCL 2 MG/2ML IJ SOLN
INTRAMUSCULAR | Status: DC | PRN
  Administered 2017-09-05 (×2): 1 mg via INTRAVENOUS

## 2017-09-05 MED ORDER — SODIUM CHLORIDE 0.9 % IV SOLN
1500.0000 mg | Freq: Once | INTRAVENOUS | Status: AC
  Administered 2017-09-05: 1500 mg via INTRAVENOUS
  Filled 2017-09-05: qty 15

## 2017-09-05 MED ORDER — IOPAMIDOL (ISOVUE-370) INJECTION 76%
INTRAVENOUS | Status: AC
  Administered 2017-09-05: 90 mL
  Filled 2017-09-05: qty 100

## 2017-09-05 MED ORDER — HEPARIN (PORCINE) IN NACL 2-0.9 UNIT/ML-% IJ SOLN
INTRAMUSCULAR | Status: AC | PRN
  Administered 2017-09-05: 1000 mL via INTRA_ARTERIAL

## 2017-09-05 MED ORDER — DEXTROSE 5 % IV SOLN
2.0000 g | Freq: Once | INTRAVENOUS | Status: DC
  Filled 2017-09-05 (×2): qty 2

## 2017-09-05 MED ORDER — SODIUM CHLORIDE 0.9% FLUSH: 3.0000 mL | INTRAVENOUS | Status: DC | PRN

## 2017-09-05 MED ORDER — HEPARIN (PORCINE) IN NACL 2-0.9 UNIT/ML-% IJ SOLN
INTRAMUSCULAR | Status: AC
  Filled 2017-09-05: qty 1000

## 2017-09-05 MED ORDER — ACETAMINOPHEN 650 MG RE SUPP
650.0000 mg | Freq: Four times a day (QID) | RECTAL | Status: DC | PRN
  Administered 2017-09-05: 650 mg via RECTAL
  Filled 2017-09-05: qty 1

## 2017-09-05 MED ORDER — HEPARIN SODIUM (PORCINE) 5000 UNIT/ML IJ SOLN
5000.0000 [IU] | Freq: Three times a day (TID) | INTRAMUSCULAR | Status: DC
  Administered 2017-09-06 – 2017-09-10 (×14): 5000 [IU] via SUBCUTANEOUS
  Filled 2017-09-05 (×14): qty 1

## 2017-09-05 MED ORDER — IOPAMIDOL (ISOVUE-370) INJECTION 76%
INTRAVENOUS | Status: AC
Start: 2017-09-05 — End: ?
  Filled 2017-09-05: qty 125

## 2017-09-05 MED ORDER — PNEUMOCOCCAL VAC POLYVALENT 25 MCG/0.5ML IJ INJ
0.5000 mL | INJECTION | INTRAMUSCULAR | Status: DC
  Filled 2017-09-05: qty 0.5

## 2017-09-05 MED ORDER — POTASSIUM CHLORIDE 10 MEQ/100ML IV SOLN
10.0000 meq | INTRAVENOUS | Status: AC
  Administered 2017-09-05 (×2): 10 meq via INTRAVENOUS
  Filled 2017-09-05 (×2): qty 100

## 2017-09-05 MED ORDER — NOREPINEPHRINE BITARTRATE 1 MG/ML IV SOLN
2.0000 ug/min | INTRAVENOUS | Status: DC
  Administered 2017-09-05: 32 ug/min via INTRAVENOUS
  Filled 2017-09-05 (×2): qty 16

## 2017-09-05 MED ORDER — MIDAZOLAM HCL 2 MG/2ML IJ SOLN
INTRAMUSCULAR | Status: AC
  Filled 2017-09-05: qty 2

## 2017-09-05 MED ORDER — NOREPINEPHRINE BITARTRATE 1 MG/ML IV SOLN
INTRAVENOUS | Status: AC | PRN
  Administered 2017-09-05: 5 ug/min via INTRAVENOUS

## 2017-09-05 MED ORDER — MIDAZOLAM HCL 50 MG/10ML IJ SOLN
1.0000 mg/h | INTRAMUSCULAR | Status: DC
  Administered 2017-09-05: 1 mg/h via INTRAVENOUS
  Administered 2017-09-05 (×2): 2 mg/h via INTRAVENOUS
  Administered 2017-09-05 (×2): 1 mg/h via INTRAVENOUS
  Filled 2017-09-05 (×2): qty 10

## 2017-09-05 MED ORDER — SODIUM CHLORIDE 0.9 % IV SOLN
INTRAVENOUS | Status: AC
  Administered 2017-09-05: 10:00:00 via INTRAVENOUS

## 2017-09-05 MED ORDER — VANCOMYCIN HCL IN DEXTROSE 1-5 GM/200ML-% IV SOLN
1000.0000 mg | Freq: Two times a day (BID) | INTRAVENOUS | Status: DC
Start: 2017-09-06 — End: 2017-09-08
  Administered 2017-09-06 – 2017-09-08 (×5): 1000 mg via INTRAVENOUS
  Filled 2017-09-05 (×9): qty 200

## 2017-09-05 MED ORDER — SODIUM CHLORIDE 0.9% FLUSH
3.0000 mL | Freq: Two times a day (BID) | INTRAVENOUS | Status: DC
  Administered 2017-09-05 – 2017-09-10 (×4): 3 mL via INTRAVENOUS

## 2017-09-05 MED ORDER — CHLORHEXIDINE GLUCONATE 0.12% ORAL RINSE (MEDLINE KIT)
15.0000 mL | Freq: Two times a day (BID) | OROMUCOSAL | Status: DC
  Administered 2017-09-05 – 2017-09-09 (×6): 15 mL via OROMUCOSAL

## 2017-09-05 MED ORDER — SUCCINYLCHOLINE CHLORIDE 20 MG/ML IJ SOLN
INTRAMUSCULAR | Status: DC | PRN
  Administered 2017-09-05: 100 mg via INTRAVENOUS

## 2017-09-05 MED ORDER — NOREPINEPHRINE BITARTRATE 1 MG/ML IV SOLN
INTRAVENOUS | Status: AC
  Filled 2017-09-05: qty 4

## 2017-09-05 MED ORDER — IPRATROPIUM-ALBUTEROL 0.5-2.5 (3) MG/3ML IN SOLN
3.0000 mL | RESPIRATORY_TRACT | Status: DC | PRN
  Administered 2017-09-05 (×2): 3 mL via RESPIRATORY_TRACT

## 2017-09-05 SURGICAL SUPPLY — 11 items
CATH INFINITI 5FR MULTPACK ANG (CATHETERS) ×1 IMPLANT
ELECT DEFIB PAD ADLT CADENCE (PAD) ×1 IMPLANT
HOVERMATT SINGLE USE (MISCELLANEOUS) ×1 IMPLANT
KIT HEART LEFT (KITS) ×2 IMPLANT
PACK CARDIAC CATHETERIZATION (CUSTOM PROCEDURE TRAY) ×2 IMPLANT
SHEATH PINNACLE 5F 10CM (SHEATH) IMPLANT
SHEATH PINNACLE 6F 10CM (SHEATH) ×1 IMPLANT
SYR MEDRAD MARK V 150ML (SYRINGE) ×2 IMPLANT
TRANSDUCER W/STOPCOCK (MISCELLANEOUS) ×2 IMPLANT
TUBING CIL FLEX 10 FLL-RA (TUBING) ×2 IMPLANT
WIRE EMERALD 3MM-J .035X150CM (WIRE) ×1 IMPLANT

## 2017-09-05 NOTE — Progress Notes (Signed)
RT called to pt room with sats dropping.  Pt had been placed on 6lpm [place] and sats were still in 80s.  RT and RN placed pt on NRB at 15LPM and pt's sats increased to 98%.  Rn replaced NRB with [place] at 6LPM and sats dropped again.  RT called back to assess pt and placed pt back on NRB.  RT gave duoneb and placed back on NRB.  RT will continue to monitor.

## 2017-09-05 NOTE — Consult Note (Signed)
Neurology Consultation  Reason for Consult: posturing Referring Physician: Dr Rayann Heman  CC: seizure like activity  History is obtained from:chart and nurse  HPI: Brenda Lloyd is a 66 y.o. female with PMH of HTN, who has been admitted to the hospital for evaluation of chest pain and NSTEMI, and is s/p cardiac cath this morning. 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 She was emergently taken for the cardiac cath because she woke up this morning and had chest pain, which was relieved by some morphine but she continued to complain of chest pain and a bedside echo that showed a left ventricular ejection fraction of 40%, she became hypoxic and continued to remain hypoxic. She returned to the ICU from cardiac cath with medication. She was noted to be actively seizing and extensor posturing by Dr. Rayann Heman when he came to check on her this AM after the cath.. Neurology was consulted for concern for seizures. She was given multiple doses of ativan and is on versed drip and fentanyl drip. Also received multiple fentanyl boluses. Husband and son at bedside. No history of seizures. No history of drug/alcohol abuse. No history of trauma. No history of patient ever having had lost a pulse. No history of prolonged hypoxia on chart review. She was hypotensive at some point per the nursing staff but not extreme.  ROS: Unable to obtain due to altered mental status.   Past Medical History:  Diagnosis Date  . Hypertension     No family history on file.  Social History:   reports that she has never smoked. She has never used smokeless tobacco. She reports that she drinks alcohol. She reports that she does not use drugs. Medications  Current Facility-Administered Medications:  .  0.9 %  sodium chloride infusion, , Intravenous, STAT, Charlesetta Shanks, MD, Stopped at 09/05/17 0515 .  0.9 %  sodium chloride infusion, 250 mL, Intravenous, PRN, Leonie Man, MD .  aspirin EC tablet 81 mg, 81 mg, Oral,  Daily, Eula Fried, Waqas T, MD .  atorvastatin (LIPITOR) tablet 80 mg, 80 mg, Oral, q1800, Geralynn Ochs T, MD .  chlorhexidine gluconate (MEDLINE KIT) (PERIDEX) 0.12 % solution 15 mL, 15 mL, Mouth Rinse, BID, Leonie Man, MD, 15 mL at 09/05/17 [phone] .  fentaNYL 2525mg in NS 2564m(1030mml) infusion-PREMIX, 0-200 mcg/hr, Intravenous, Continuous, QurGeralynn Ochs MD, Last Rate: 20 mL/hr at 09/05/17 1304, 200 mcg/hr at 09/05/17 1304 .  heparin injection 5,000 Units, 5,000 Units, Subcutaneous, Q8H, HarLeonie ManD .  ipratropium-albuterol (DUONEB) 0.5-2.5 (3) MG/3ML nebulizer solution 3 mL, 3 mL, Nebulization, PRN, QurGeralynn Ochs MD, 3 mL at 09/05/17 0518 .  MEDLINE mouth rinse, 15 mL, Mouth Rinse, QID, HarLeonie ManD, 15 mL at 09/05/17 1137 .  midazolam (VERSED) 50 mg in sodium chloride 0.9 % 50 mL (1 mg/mL) infusion, 1-4 mg/hr, Intravenous, Continuous, QurGeralynn Ochs MD, Last Rate: 2 mL/hr at 09/05/17 1304, 2 mg/hr at 09/05/17 1304 .  nitroGLYCERIN (NITROGLYN) 2 % ointment 1 inch, 1 inch, Topical, Q6H, Pfeiffer, Marcy, MD, 1 inch at 09/04/17 2331 .  nitroGLYCERIN (NITROSTAT) SL tablet 0.4 mg, 0.4 mg, Sublingual, Q5 min PRN, PfeCharlesetta ShanksD, 0.4 mg at 09/05/17 0432 .  norepinephrine (LEVOPHED) 4 mg in dextrose 5 % 250 mL (0.016 mg/mL) infusion, 2-50 mcg/min, Intravenous, Titrated, HarLeonie ManD .  sodium chloride flush (NS) 0.9 % injection 3 mL, 3 mL, Intravenous, Q12H, QurGeralynn Ochs MD, 3 mL at 09/04/17 2244 .  sodium  chloride flush (NS) 0.9 % injection 3 mL, 3 mL, Intravenous, Q12H, Leonie Man, MD .  sodium chloride flush (NS) 0.9 % injection 3 mL, 3 mL, Intravenous, PRN, Leonie Man, MD  Exam: Current vital signs: BP (!) 95/57   Pulse (!) 112   Temp 99.8 F (37.7 C) (Oral)   Resp 11   Ht 5' 9" (1.753 m)   Wt 83.6 kg (184 lb 4.8 oz)   SpO2 95%   BMI 27.22 kg/m  Vital signs in last 24 hours: Temp:  [97.8 F (36.6 C)-99.8 F (37.7 C)] 99.8  F (37.7 C) (09/09 1201) Pulse Rate:  [74-117] 112 (09/09 1345) Resp:  [11-30] 11 (09/09 1345) BP: (82-141)/(49-106) 95/57 (09/09 1345) SpO2:  [89 %-100 %] 95 % (09/09 1345) FiO2 (%):  [40 %-100 %] 40 % (09/09 1200) Weight:  [74.8 kg (165 lb)-83.6 kg (184 lb 4.8 oz)] 83.6 kg (184 lb 4.8 oz) (09/08 2028) On my initial exam, patient was sedated intubated, on versed and fentanyl drip. No active seizures. No pupillary response. No Dolls eye. No corneals. Not breathing over the vent. Had a +ve cough. No response to nox stim.  Exam after returning from CT Still sedated/intubated Pupils sluggishly reactive. Corneals+ Cough+ Breathing over the vent. Oculocephalics +/- Slight grimace to nox stim in b/l UE Some withdrawal to nox stim on b/l LE  Labs I have reviewed labs in epic and the results pertinent to this consultation are:  CBC    Component Value Date/Time   WBC 13.6 (H) 09/04/2017 1500   RBC 4.57 09/04/2017 1500   HGB 13.2 09/04/2017 1500   HCT 38.7 09/04/2017 1500   PLT 301 09/04/2017 1500   MCV 84.7 09/04/2017 1500   MCH 28.9 09/04/2017 1500   MCHC 34.1 09/04/2017 1500   RDW 13.6 09/04/2017 1500    CMP     Component Value Date/Time   NA 139 09/05/2017 0602   K 3.5 09/05/2017 0602   CL 107 09/05/2017 0602   CO2 19 (L) 09/05/2017 0602   GLUCOSE 154 (H) 09/05/2017 0602   BUN 8 09/05/2017 0602   CREATININE 0.61 09/05/2017 0602   CALCIUM 8.8 (L) 09/05/2017 0602   PROT 7.0 09/05/2017 0602   ALBUMIN 3.8 09/05/2017 0602   AST 38 09/05/2017 0602   ALT 31 09/05/2017 0602   ALKPHOS 85 09/05/2017 0602   BILITOT 1.5 (H) 09/05/2017 0602   GFRNONAA >60 09/05/2017 0602   GFRAA >60 09/05/2017 0602    Lipid Panel     Component Value Date/Time   CHOL 187 09/04/2017 2105   TRIG 99 09/04/2017 2105   HDL 34 (L) 09/04/2017 2105   CHOLHDL 5.5 09/04/2017 2105   VLDL 20 09/04/2017 2105   LDLCALC 133 (H) 09/04/2017 2105   Imaging I have reviewed the images  obtained:  CT-scan of the brain And CTA of the head and neck was performed stat after my evaluation of the patient. No changes suggestive of early ischemia or bleed on the CTA. CTA shows some irregularities in the intracranial vessels with no large vessel occlusion.  Assessment:  66/F whith PMH of HTN, who has been admitted to the hospital for evaluation of chest pain and NSTEMI, and is s/p cardiac cath this morning, noted to be having seizures and possible extensor posturing. My initial exam, on sedation was very poor. After obtaining a stat CT and CT of the head did not not show any acute abnormalities, heart exam was slightly better-had  pupillary reflex, corneal reflexes and is breathing over the vent with minimal withdrawal in the lower extremity is. Differentials include a toxic metabolic encephalopathy secondary to medication versus seizures. She had to be taken for an emergent cardiac cath because she was having chest pain and was hypoxic, and that is why she had to be intubated. Unclear if there was any period of prolonged hypoxia, nothing that I can find in the chart to indicate that.  Impression: Evaluate for seizures and possible status epilepticus  Evaluate for structural brain lesion including hypoxic/anoxic injury.  Recommendations: keppra 1500 load IV once Keppra 500 BID scheduled-starting at 10 PM No ativan unless seizure like activity >68mn. Call neurology also. EEG - stat. Technologist called in and will be doing that soon. MRI when possible. We will continue to follow with you.  -- AAmie Portland MD [facility] [phone] If 7pm to 7am, please call on call as listed on AMION.

## 2017-09-05 NOTE — Progress Notes (Signed)
ANTICOAGULATION CONSULT NOTE - Follow-Up Consult  Pharmacy Consult for Heparin Indication: chest pain/ACS  Patient Measurements: Height: 5\' 9"  (175.3 cm) Weight: 184 lb 4.8 oz (83.6 kg) IBW/kg (Calculated) : 66.2 Heparin Dosing Weight: 75 kg  Vital Signs: Temp: 98.9 F (37.2 C) (09/09 0530) Temp Source: Axillary (09/09 0530) BP: 118/68 (09/09 0450) Pulse Rate: 105 (09/09 0530)  Labs:  Recent Labs  09/04/17 1500 09/04/17 1857 09/04/17 2105 09/05/17 0202 09/05/17 0602  HGB 13.2  --   --   --   --   HCT 38.7  --   --   --   --   PLT 301  --   --   --   --   LABPROT 13.5  --   --   --   --   INR 1.04  --   --   --   --   HEPARINUNFRC  --   --  0.42  --  0.15*  CREATININE 0.70  --   --   --   --   TROPONINI 1.21* 3.15* 2.35* 3.67*  --     Estimated Creatinine Clearance: 79.9 mL/min (by C-G formula based on SCr of 0.7 mg/dL).   Assessment: [name] [facility]. Pharmacy is consulted to start IV heparin for ACS sx. Heparin previously therapeutic x2; however, pt developed respiratory distress requiring intubation and transfer to 2H. Heparin level subtherapeutic this morning at 0.15, per RN it is unknown whether heparin was stopped during transport. Will increase rate slightly, pt to undergo emergent cath this morning - will follow-up post-cath.  Goal of Therapy:  Heparin level 0.3-0.7 units/ml Monitor platelets by anticoagulation protocol: Yes   Plan:  -Increase heparin to 950 units/hr -Follow-up post-cath  Brenda Lloyd, PharmD PGY-2 Cardiology Pharmacy Resident Pager: [phone] 09/05/2017

## 2017-09-05 NOTE — Progress Notes (Signed)
eLink Physician-Brief Progress Note Patient Name: Brenda Lloyd DOB: 1951/02/15 MRN: [id number]   Date of Service  09/05/2017  HPI/Events of Note  Review of CXR reveals R IJ CVL tip in R atrium. CVL needs to be pulled back.   eICU Interventions  Ground team contacted to pull back R IJ CVL.      Intervention Category Intermediate Interventions: Diagnostic test evaluation  Lenell AntuSommer,Steven Eugene 09/05/2017, 9:39 PM

## 2017-09-05 NOTE — Progress Notes (Signed)
Per MD  [facility] may now be accessed

## 2017-09-05 NOTE — Progress Notes (Signed)
EEG completed; results pending.    

## 2017-09-05 NOTE — Interval H&P Note (Signed)
History and Physical Interval Note:  09/05/2017 7:49 AM  Brenda Lloyd  has presented today for surgery, with the diagnosis of NSTEMI -with flash pulmonary edema and ongoing chest pain. Over the course of her hospitalization, she is decompensated with respiratory failure and intubated upon transfer to the ICU. She is currently borderline hypotensive because of sedation. Her troponin levels are roughly 4, and her EKG shows signs of possible anterior Q waves. Dr. Virgina OrganQureshi also noted that she has a murmur that was not noted on initial exam.  The decision made to bring her for urgent cardiac catheterization plus or minus PCI.    The various methods of treatment have been discussed with the patient and family. After consideration of risks, benefits and other options for treatment, the patient has consented to  Procedure(s): LEFT HEART CATH AND CORONARY ANGIOGRAPHY (N/A) as a surgical intervention .  The patient's history has been reviewed, patient examined, no change in status, stable for surgery.  I have reviewed the patient's chart and labs.  Questions were answered to the patient's satisfaction.     Bryan Lemmaavid Harding

## 2017-09-05 NOTE — Progress Notes (Signed)
Per Dr. Wilford CornerArora- Call neurology if patient has seizure like activity lasting greater than five minutes. Do not treat with any medication until speaking with neurology.

## 2017-09-05 NOTE — Progress Notes (Addendum)
CARDIOLOGY PROGRESS NOTE - CRITICAL CARE NOTE  Patient ID: Brenda Lloyd MRN: [id number], DOB/AGE: 1951-05-25   Admit date: 09/04/2017  Reason for critical care time: Acute hypoxic respiratory failure  Subjective: For detailed HPI see my note. Patient woke up around 4 pm and went to the restroom. She suddenly developed SOB and chest pain. Her oxygen sats dropped to 80s requiring non rebreather and then ventimask. Patient was given IV lasix 40 mg. Foley catheter was placed. She had good response. She was transferred to CICU 2H24. Meanwhile, her husband and herself was explained what was going on. A bedside echo was performed that showed anterior wall motion abnormality with estimated LVEF of 40%. Patient was given morphine with some improvement in pain. She continued to remain hypoxic so anesthesia was called and patient was emergently intubated.   OBJECTIVE Physical Exam: Vitals: BP (!) 82/57   Pulse (!) 117   Temp 98.9 F (37.2 C) (Axillary)   Resp (!) 30   Ht  (1.753 m)   Wt 83.6 kg (184 lb 4.8 oz)   SpO2 98%   BMI 27.22 kg/m  General: in acute respiratory distress Neck: JVP elevated, neck supple Mouth: small airway with 1 finger breath mouth opening Heart: tachycardiac  S1, S2, holosystolic murmur   Lungs: bilateral crackles up to mid lungs GI: non tender, non distended, bowel sounds present Extremities: no edema Neuro: AAO x 3  Psych: anxious   Labs:   Results for orders placed or performed during the hospital encounter of 09/04/17 (from the past 24 hour(s))  Basic metabolic panel     Status: Abnormal   Collection Time: 09/04/17  3:00 PM  Result Value Ref Range   Sodium 140 135 - 145 mmol/L   Potassium 3.6 3.5 - 5.1 mmol/L   Chloride 105 101 - 111 mmol/L   CO2 25 22 - 32 mmol/L   Glucose, Bld 154 (H) 65 - 99 mg/dL   BUN 12 6 - 20 mg/dL   Creatinine, Ser 3.08 0.44 - 1.00 mg/dL   Calcium 9.7 8.9 - 65.7 mg/dL   GFR calc non Af Amer >60 >60 mL/min   GFR calc  Af Amer >60 >60 mL/min   Anion gap 10 5 - 15  CBC     Status: Abnormal   Collection Time: 09/04/17  3:00 PM  Result Value Ref Range   WBC 13.6 (H) 4.0 - 10.5 K/uL   RBC 4.57 3.87 - 5.11 MIL/uL   Hemoglobin 13.2 12.0 - 15.0 g/dL   HCT 84.6 96.2 - 95.2 %   MCV 84.7 78.0 - 100.0 fL   MCH 28.9 26.0 - 34.0 pg   MCHC 34.1 30.0 - 36.0 g/dL   RDW 84.1 32.4 - 40.1 %   Platelets 301 150 - 400 K/uL  Troponin I     Status: Abnormal   Collection Time: 09/04/17  3:00 PM  Result Value Ref Range   Troponin I 1.21 (HH) <0.03 ng/mL  Hepatic function panel     Status: None   Collection Time: 09/04/17  3:00 PM  Result Value Ref Range   Total Protein 7.7 6.5 - 8.1 g/dL   Albumin 4.1 3.5 - 5.0 g/dL   AST 27 15 - 41 U/L   ALT 33 14 - 54 U/L   Alkaline Phosphatase 82 38 - 126 U/L   Total Bilirubin 0.8 0.3 - 1.2 mg/dL   Bilirubin, Direct 0.1 0.1 - 0.5 mg/dL   Indirect Bilirubin 0.7 0.3 -  0.9 mg/dL  Lipase, blood     Status: None   Collection Time: 09/04/17  3:00 PM  Result Value Ref Range   Lipase 42 11 - 51 U/L  Protime-INR     Status: None   Collection Time: 09/04/17  3:00 PM  Result Value Ref Range   Prothrombin Time 13.5 11.4 - 15.2 seconds   INR 1.04   D-dimer, quantitative     Status: None   Collection Time: 09/04/17  3:00 PM  Result Value Ref Range   D-Dimer, Quant <0.27 0.00 - 0.50 ug/mL-FEU  Brain natriuretic peptide     Status: None   Collection Time: 09/04/17  3:00 PM  Result Value Ref Range   B Natriuretic Peptide 43.2 0.0 - 100.0 pg/mL  TSH     Status: Abnormal   Collection Time: 09/04/17  3:53 PM  Result Value Ref Range   TSH 0.102 (L) 0.350 - 4.500 uIU/mL  Hemoglobin A1c     Status: None   Collection Time: 09/04/17  3:53 PM  Result Value Ref Range   Hgb A1c MFr Bld 5.6 4.8 - 5.6 %   Mean Plasma Glucose 114.02 mg/dL  Troponin I     Status: Abnormal   Collection Time: 09/04/17  6:57 PM  Result Value Ref Range   Troponin I 3.15 (HH) <0.03 ng/mL  Brain natriuretic peptide      Status: Abnormal   Collection Time: 09/04/17  9:05 PM  Result Value Ref Range   B Natriuretic Peptide 147.3 (H) 0.0 - 100.0 pg/mL  TSH     Status: Abnormal   Collection Time: 09/04/17  9:05 PM  Result Value Ref Range   TSH 0.107 (L) 0.350 - 4.500 uIU/mL  Troponin I     Status: Abnormal   Collection Time: 09/04/17  9:05 PM  Result Value Ref Range   Troponin I 2.35 (HH) <0.03 ng/mL  Hemoglobin A1c     Status: Abnormal   Collection Time: 09/04/17  9:05 PM  Result Value Ref Range   Hgb A1c MFr Bld 5.7 (H) 4.8 - 5.6 %   Mean Plasma Glucose 116.89 mg/dL  Lipid panel     Status: Abnormal   Collection Time: 09/04/17  9:05 PM  Result Value Ref Range   Cholesterol 187 0 - 200 mg/dL   Triglycerides 99 <161<150 mg/dL   HDL 34 (L) >09>40 mg/dL   Total CHOL/HDL Ratio 5.5 RATIO   VLDL 20 0 - 40 mg/dL   LDL Cholesterol 604133 (H) 0 - 99 mg/dL  Heparin level (unfractionated)     Status: None   Collection Time: 09/04/17  9:05 PM  Result Value Ref Range   Heparin Unfractionated 0.42 0.30 - 0.70 IU/mL  Troponin I     Status: Abnormal   Collection Time: 09/05/17  2:02 AM  Result Value Ref Range   Troponin I 3.67 (HH) <0.03 ng/mL  Protime-INR     Status: None   Collection Time: 09/05/17  6:02 AM  Result Value Ref Range   Prothrombin Time 14.0 11.4 - 15.2 seconds   INR 1.09   Heparin level (unfractionated)     Status: Abnormal   Collection Time: 09/05/17  6:02 AM  Result Value Ref Range   Heparin Unfractionated 0.15 (L) 0.30 - 0.70 IU/mL     Radiology/Studies: Dg Chest 2 View  Result Date: 09/04/2017 CLINICAL DATA:  66 year old female with progressive central chest pain and shortness of breath EXAM: CHEST  2 VIEW COMPARISON:  None.  FINDINGS: The lungs are clear and negative for focal airspace consolidation, pulmonary edema or suspicious pulmonary nodule. No pleural effusion or pneumothorax. Cardiac and mediastinal contours are within normal limits. No acute fracture or lytic or blastic osseous  lesions. The visualized upper abdominal bowel gas pattern is unremarkable. IMPRESSION: Negative chest x-ray. Electronically Signed   By: Malachy Moan M.D.   On: 09/04/2017 15:29   Dg Chest Port 1 View  Result Date: 09/05/2017 CLINICAL DATA:  Increased shortness of breath. History of hypertension. EXAM: PORTABLE CHEST 1 VIEW COMPARISON:  Chest radiograph September 04, 2017 FINDINGS: Cardiac silhouette is upper limits of normal size. Mediastinal silhouette is nonsuspicious. Increasing interstitial prominence without pleural effusion or focal consolidation. No pneumothorax. Soft tissue planes and included osseous structures are nonsuspicious. IMPRESSION: New interstitial prominence seen with pulmonary edema and atypical infection. No focal consolidation. Electronically Signed   By: Awilda Metro M.D.   On: 09/05/2017 05:43    Medical decision making:  Discussed care with the patient and husband   ASSESSMENT AND PLAN:  This is a 66 y.o. WF with worsening symptomatology who presented with NSTEMI, with Q waves and non specific St elevation of the anterior leads. Subsequently she developed stuttering chest pain and pulmonary edema requiring intubation.    Principal Problem:   NSTEMI (non-ST elevated myocardial infarction) (HCC) Active Problems:   Systolic murmur   Flash pulmonary edema (HCC)   Acute respiratory failure with hypoxia (HCC)  Acute hypoxic respiratory failure 2/2 pulmonary edema requiring intubation, critical care consult has been called. Administered 1 x IV lasix (40 mg) with good response. Discussed care with Dr. Herbie Baltimore for emergent cardiac catheterization. Sedation orders were placed.  NSTEMI - high risk Kilip class 3 - will go ahead with emergent cardiac cath, however patient remained hemodynamically stable   Systolic murmur - need echo today  High risk of upper airway obstruction due to High Mallampati score of 4.   Laurell Roof, MD MS 09/05/2017, 7:56 AM     Time spent 1 hour and 30 minutes

## 2017-09-05 NOTE — Progress Notes (Addendum)
Patient called RN after getting up to the bathroom. Per pt. She wasn't feeling well. Pt. Reported 3/10 chest tightness. Given 1 nitro @4 :33 am. Pt BP was 120/66. BP Re-check after nitro: 118/68. Pt said she still doesn't feel well. RN asked if pt had her oxygen when she got up. Per pt. She didn't used her oxygen getting up and started feeling winded then. RN checked pt. SPo2 and pt was in 75-78 at 2L. RN bumped pt to 6L oxygen and it barely help her SPO2. RN called RT. RT instructed RN to start NRB. NRB started at 15mL. RT and rapid response arrived at bedside. RN txtpaged Dr. Virgina OrganQureshi. Received verbal order for 2mg  IV morphine.   Rapid Response RN tried to wean pt off NRB. Pt. SPO2 dropped again as soon as NRB was replaced. RT called back at bedside. RN txtpaged Dr. Virgina OrganQureshi. Received verbal order for stat portable Xray, 40mg  IV lasix, and PRN duonebs.  RT gave duoneb and placed back on NRB. RN gave IV lasix, and morphine. RN also called pt husband. Portable CXR at bedside. Will monitor.

## 2017-09-05 NOTE — Procedures (Signed)
Central Venous Catheter Insertion Procedure Note Brenda Lloyd [id number] 01-Dec-1951  Procedure: Insertion of Central Venous Catheter Indications: Drug and/or fluid administration  Procedure Details Consent: Risks of procedure as well as the alternatives and risks of each were explained to the (patient/caregiver).  Consent for procedure obtained. Time Out: Verified patient identification, verified procedure, site/side was marked, verified correct patient position, special equipment/implants available, medications/allergies/relevent history reviewed, required imaging and test results available.  Performed  Maximum sterile technique was used including antiseptics, cap, gloves, gown, hand hygiene, mask and sheet. Skin prep: Chlorhexidine; local anesthetic administered A antimicrobial bonded/coated triple lumen catheter was placed in the right internal jugular vein using the Seldinger technique from single stick.  Evaluation Blood flow good Complications: No apparent complications Patient did tolerate procedure well. Chest X-ray ordered to verify placement.  CXR: pending.  Brenda Lloyd Brenda Lloyd 09/05/2017, 8:59 PM

## 2017-09-05 NOTE — Progress Notes (Addendum)
eLink Physician-Brief Progress Note Patient Name: Brenda Lloyd DOB: [birth year] MRN: [id number]   Date of Service  09/05/2017  HPI/Events of Note  Multiple issues: 1. Temp = 102.2 F - No Abx and not cultured. Last WBC = 13.6. AST and ALT are both normal and 2. Request for A-line. Hx of seizure disorder.  eICU Interventions  Will order: 1. Blood cultures X 2 now. 2. Tracheal aspirate culture now.  3. UA now.  4. Tylenol Suppository 650 mg PR Q 6 hours PRN Temp > 101.0 F. 5. Vancomycin an Azactam per pharmacy consult.      Intervention Category Major Interventions: Other:  Lenell AntuSommer,Burak Zerbe Eugene 09/05/2017, 5:57 PM

## 2017-09-05 NOTE — Progress Notes (Signed)
PULMONARY / CRITICAL CARE MEDICINE   Name: Brenda Lloyd MRN: [id number] DOB: 12-24-1951    ADMISSION DATE:  09/04/2017 CONSULTATION DATE:  09/05/2017  REFERRING MD:  Virgina Organ  CHIEF COMPLAINT:  Chest pain and dyspnea  HISTORY OF PRESENT ILLNESS:   This is a 66 year old with no known history of coronary disease, who  presented with several days of increasing chest pain and dyspnea. She had a slight bump in her troponin with a max of 3.15, but no definitive ST segment changes. Early this morning she developed increasing dyspnea and desaturation and she required emergent intubation. It should be noted that she has limited jaw excursion but there was no apparent difficulty in intubating the patient. 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 Bedside echo had suggested a EF of approximately 40%, at ventriculography the EF was estimated to be between 25 and 35% with a typical Takosubo pattern of LV dysfunction. . She also has 3+ mitral regurgitation. She is returned to the intensive care unit intubated mechanically ventilated and sedated. PAST MEDICAL HISTORY :  She  has a past medical history of Hypertension.  PAST SURGICAL HISTORY: She  has no past surgical history on file.  Allergies  Allergen Reactions  . Amoxicillin Anaphylaxis  . Ciprofloxacin   . Penicillins   . Sulfa Antibiotics     No current facility-administered medications on file prior to encounter.    No current outpatient prescriptions on file prior to encounter.    FAMILY HISTORY:  Her has no family status information on file.    SOCIAL HISTORY: She  reports that she has never smoked. She has never used smokeless tobacco. She reports that she drinks alcohol. She reports that she does not use drugs.  REVIEW OF SYSTEMS:   Not obtainable from the patient at this time  SUBJECTIVE:  Not  obtainable from the patient at this time  VITAL SIGNS: BP 126/83   Pulse 75   Temp 98.9 F (37.2 C) (Axillary)   Resp 15   Ht  (1.753 m)   Wt 184 lb 4.8 oz (83.6 kg)   SpO2 100%   BMI 27.22 kg/m   HEMODYNAMICS:    VENTILATOR SETTINGS: Vent Mode: PRVC FiO2 (%):  [40 %-100 %] 40 % Set Rate:  [16 bmp] 16 bmp Vt Set:  [500 mL] 500 mL PEEP:  [8 cmH20] 8 cmH20  INTAKE / OUTPUT: I/O last 3 completed shifts: In: 252.2 [I.V.:252.2] Out: 600 [Urine:600]  PHYSICAL EXAMINATION: General:  Well-nourished female appearing her stated age who is orally intubated and mechanically ventilated Neuro:  She intermittently opens her eyes to voice but does not follow instructions. Pupils are equal and she moves all fours. HEENT:  She does have limited excursion of the jaw. Cardiovascular:  S1 and S2 are regular with a 2/6 systolic murmur at the apex. No carotid bruits. There is no JVD and there is no dependent edema. Lungs: There is symmetric air movement a few scattered rales and rhonchi, no wheezes. She is not breathing above the set ventilator rate  Abdomen:  The abdomen is slightly obese without any obvious organomegaly or masses there is no tenderness. There is no  hematoma at the right groin site.   LABS:  BMET  Recent Labs Lab 09/04/17 1500 09/05/17 0602  NA 140 139  K 3.6 3.5  CL 105 107  CO2  25 19*  BUN 12 8  CREATININE 0.70 0.61  GLUCOSE 154* 154*    Electrolytes  Recent Labs Lab 09/04/17 1500 09/05/17 0602  CALCIUM 9.7 8.8*    CBC  Recent Labs Lab 09/04/17 1500  WBC 13.6*  HGB 13.2  HCT 38.7  PLT 301    Coag's  Recent Labs Lab 09/04/17 1500 09/05/17 0602  INR 1.04 1.09    Sepsis Markers No results for input(s): LATICACIDVEN, PROCALCITON, O2SATVEN in the last 168 hours.  ABG No results for input(s): PHART, PCO2ART, PO2ART in the last 168 hours.  Liver Enzymes  Recent Labs Lab 09/04/17 1500 09/05/17 0602  AST 27 38  ALT 33 31   ALKPHOS 82 85  BILITOT 0.8 1.5*  ALBUMIN 4.1 3.8    Cardiac Enzymes  Recent Labs Lab 09/04/17 2105 09/05/17 0202 09/05/17 0602  TROPONINI 2.35* 3.67* 1.88*    Glucose No results for input(s): GLUCAP in the last 168 hours.  Imaging Dg Chest 2 View  Result Date: 09/04/2017 CLINICAL DATA:  66 year old female with progressive central chest pain and shortness of breath EXAM: CHEST  2 VIEW COMPARISON:  None. FINDINGS: The lungs are clear and negative for focal airspace consolidation, pulmonary edema or suspicious pulmonary nodule. No pleural effusion or pneumothorax. Cardiac and mediastinal contours are within normal limits. No acute fracture or lytic or blastic osseous lesions. The visualized upper abdominal bowel gas pattern is unremarkable. IMPRESSION: Negative chest x-ray. Electronically Signed   By: Malachy MoanHeath  McCullough M.D.   On: 09/04/2017 15:29   Dg Chest Port 1 View  Result Date: 09/05/2017 CLINICAL DATA:  Increased shortness of breath. History of hypertension. EXAM: PORTABLE CHEST 1 VIEW COMPARISON:  Chest radiograph September 04, 2017 FINDINGS: Cardiac silhouette is upper limits of normal size. Mediastinal silhouette is nonsuspicious. Increasing interstitial prominence without pleural effusion or focal consolidation. No pneumothorax. Soft tissue planes and included osseous structures are nonsuspicious. IMPRESSION: New interstitial prominence seen with pulmonary edema and atypical infection. No focal consolidation. Electronically Signed   By: Awilda Metroourtnay  Bloomer M.D.   On: 09/05/2017 05:43     STUDIES:  P chest x-ray shows a well-placed endotracheal tube and very slight vascular engorgement.    ANTIBIOTICS: None  ASSESSMENT / PLAN:  This is a 66 year old who presented with chest pain and shortness of breath who has developed overt CHF. At cardiac catheterization she has Takosubo physiology with 3+ mitral regurgitation. Plan to keep her intubated today as she is diuresed with  evaluation for extubation in the morning. With her sedation in place her blood pressure has been marginal, and I will not be contemplating the addition of an afterload reducing agent until we see how she tolerates diuresis.   Also of note she has a low TSH and I have ordered a free T4.  Greater than 35 minutes was spent in the care of this acutely ill patient today.   Penny PiaWJ Gray, MD Pulmonary and Critical Care Medicine [facility] Pager: [phone]  09/05/2017, 9:27 AM

## 2017-09-05 NOTE — Procedures (Signed)
Date of recording 09/05/2017  Referring physician Mauricio Arrien  Technical Digital EEG recording using 10-international electrode system  Reason for the study 66 year old female currently intubated and had seizure-like events  Description of the recording Posterior dominant rhythm is 6-7 Hz symmetrical and reactive but intermixed with fast beta activity EEG comprised of generalized beta activity which is medication effect such as benzodiazepine. Focal slowing, Epileptiform features were not seen during this recording Sleep architecture was not observed  Impression  The EEG is abnormal and findings are suggestive of Nonspecific generalized cerebral dysfunction. Epileptiform activity was not seen during this recording.

## 2017-09-05 NOTE — Progress Notes (Signed)
Received page from carelink and called to confirm with the cath lab. Charge nurse confirmed that I did not need to make visitation since Pt was drowsy,under medication and intubated. Chaaplain Benyamin Jeff referred pt to next chaplain on call.  Chaplain Shaina Gullatt.

## 2017-09-05 NOTE — Progress Notes (Addendum)
   RT and I assessed patient's respiratory status, per patient, she had gotten up to use the restroom, while ambulating back she became hypoxic and short of breath, SpO2 was in 80-82%, patient was placed on the NRB 15L prior to my arrival, we were able to recruit her oxgyen saturation to 99% on NRB. Patient's color was gray when I initially arrived. Sats improved initially but did not maintain.  Several attempts were made to wean oxygen down, patient's saturations would drop into low 80s, patient left on NRB, CARDS MD called, ordered CXR, DuoNeb, and Lasix.  MD did come assess patient, did a bedside echo, patient will need urgent cath, and will transfer to CCU prior to going to the cath lab. Patient's airway/respiratory status will need to be addressed.    SBP 100-120s, HR low 100s ST, RR 22-26 at times, currently 99% on 10-15L NRB   Transferred to 2H24, upon arrival to 2H, CARDS MD asked if patient could be intubated prior to CATH LAB intervention.  I called Anesthesia and informed them that patient needed to be intubated.  I called RT, ventilator was brought to bedside, I spoke with CARDS MD, ordered Fentanyl and Versed drips for pain/sedation.    Thank you Anesthesia Team for you help and support.   Start Time/Call Time: 0500 END TIME 0710

## 2017-09-05 NOTE — Procedures (Signed)
Arterial Catheter Insertion Procedure Note Brenda AweCharlotte D Bates [id number] [birth year]  Procedure: Insertion of Arterial Catheter  Indications: Blood pressure monitoring  Procedure Details Consent: Risks of procedure as well as the alternatives and risks of each were explained to the (patient/caregiver).  Consent for procedure obtained. Time Out: Verified patient identification, verified procedure, site/side was marked, verified correct patient position, special equipment/implants available, medications/allergies/relevent history reviewed, required imaging and test results available.  Performed  Maximum sterile technique was used including antiseptics. Skin prep: Chlorhexidine; local anesthetic administered 20 gauge catheter was inserted into left radial artery using the Seldinger technique.  Evaluation Blood flow good; BP tracing good. Complications: No apparent complications.   Aline placed per MD order.  Site cleaned and dressed per protocol. RT will continue to monitor.   Ronny FlurryMorgan B Diem Pagnotta 09/05/2017

## 2017-09-05 NOTE — Progress Notes (Signed)
Pharmacy Antibiotic Note  Brenda Lloyd is a 66 y.o. female admitted on 09/04/2017 with CP and dyspnea -  now found to be febrile with temp 102.2. Pharmacy has been consulted to start Vancomycin + Azactam for empiric coverage. SCr 0.6, CrCl~80 ml/min.   Plan: 1. Vancomycin 1500 mg IV x 1 dose to load followed by 1g IV every 12 hours 2. Azactam 2g IV every 8 hours 3. Will continue to follow renal function, culture results, LOT, and antibiotic de-escalation plans   Height: 5\' 9"  (175.3 cm) Weight: 184 lb 4.8 oz (83.6 kg) IBW/kg (Calculated) : 66.2  Temp (24hrs), Avg:100.1 F (37.8 C), Min:97.8 F (36.6 C), Max:102.2 F (39 C)   Recent Labs Lab 09/04/17 1500 09/05/17 0602 09/05/17 1408  WBC 13.6*  --   --   CREATININE 0.70 0.61 0.60    Estimated Creatinine Clearance: 79.9 mL/min (by C-G formula based on SCr of 0.6 mg/dL).    Allergies  Allergen Reactions  . Amoxicillin Anaphylaxis  . Ciprofloxacin   . Penicillins   . Sulfa Antibiotics     Antimicrobials this admission: Vanc 9/9 >> Azactam 9/9 >>  Dose adjustments this admission: n/a  Microbiology results: 9/9 MRSA PCR 9/9 BCx >> 9/9 RCx >>  Thank you for allowing pharmacy to be a part of this patient's care.  Georgina PillionElizabeth Dan Scearce, PharmD, BCPS Clinical Pharmacist Pager: [phone] 09/05/2017 6:16 PM

## 2017-09-05 NOTE — Transfer of Care (Signed)
Immediate Anesthesia Transfer of Care Note  Patient: Brenda AweCharlotte D Sienkiewicz  Procedure(s) Performed: * No procedures listed *  Patient Location: ICU  Anesthesia Type:General  Level of Consciousness: sedated and Patient remains intubated per anesthesia plan  Airway & Oxygen Therapy: Patient remains intubated per anesthesia plan and Patient placed on Ventilator (see vital sign flow sheet for setting)  Post-op Assessment: Report given to RN and Post -op Vital signs reviewed and stable  Post vital signs: Reviewed and stable  Last Vitals:  Vitals:   09/05/17 0738 09/05/17 0739  BP:    Pulse: 99 (!) 117  Resp: 16 (!) 30  Temp:    SpO2: 96% 100%    Last Pain:  Vitals:   09/05/17 0530  TempSrc: Axillary  PainSc:          Complications: No apparent anesthesia complications

## 2017-09-05 NOTE — Progress Notes (Addendum)
Came by to check on patient and noticed that she was actively seizing with extensor posturing (decerebrate) and patient was unresponsive.  IV ativan 2mg  IV given by me.   I have spoken with neurologist (Dr Jerrell BelfastAurora) who also advised 2mg  IV Versed bolus and continuing on IV Versed/fentanyl.  He will see the patient asap.  Stable on vent Hemodynamics stable  Critial care time was spent personally by me (independant of midlevel providers) on the following activities: development of treatment plan with pts spouse as well as nursing, discussions with neurologist, evaluation of patients response to treatment, examining patient, obtaining history from patient or surrogate, ordering/ reviewing treatments/ interventions, lab studies, radiographic studies, pulse ox, and re-evaluation of patients condition.     The patient is critically ill with multiple organ systems failure and requires high complexity decision making for assessment and support, frequent evaluation and titration of therapies, application of advanced monitoring technologies and extensive interpretation of databases.   Critical care was necessary to treat or prevent immintent or life-threatening deterioration.    Total CCT spent directly with the patient today is 30 minutes  Cardiology following closely  Hillis RangeJames Dayyan Krist MD, [facility] 09/05/2017 1:23 PM  MD present at bedside.  Pt remains quite ill.  I have spoken to her family at length.  Her son seems angry for reasons that are not clear.  I suspect that he is appropriately worried about his mother.  Her prognosis is guarded currently.  She has been evaluated by Dr Wilford CornerArora and stat head CT is ordered.  She is no longer seizing.  She is hypotensive requiring levophed.  Will continue to follow closely.  She is too ill for much testing currently.  Will try to stabilize further and then reassess.  Hillis RangeJames Norvin Ohlin MD, [facility] 09/05/2017 3:10 PM  Addendum Head CT is reviewed and without acute findings.   Appreciate neurology input.  MRI once clinically more stable. She is on levephed, bp is a little better.  Dr Herbie BaltimoreHarding and I have assessed together at bedside at this time and agree that further ventricular support therapy may be premature at this time.  Dr Herbie BaltimoreHarding notes significant abnormality of her coronary vessels but does not feel that he can provide invasive therapy at this time.  He advises that we continue our current medical approach.  He is aware of her current condition and will follow closely overnight  Hillis RangeJames Kashawn Manzano MD, [facility] 09/05/2017 4:59 PM

## 2017-09-06 ENCOUNTER — Encounter (HOSPITAL_COMMUNITY): Payer: Self-pay | Admitting: Cardiology

## 2017-09-06 DIAGNOSIS — R570 Cardiogenic shock: Secondary | ICD-10-CM

## 2017-09-06 DIAGNOSIS — J810 Acute pulmonary edema: Secondary | ICD-10-CM

## 2017-09-06 DIAGNOSIS — I340 Nonrheumatic mitral (valve) insufficiency: Secondary | ICD-10-CM

## 2017-09-06 DIAGNOSIS — J9601 Acute respiratory failure with hypoxia: Secondary | ICD-10-CM

## 2017-09-06 DIAGNOSIS — I214 Non-ST elevation (NSTEMI) myocardial infarction: Principal | ICD-10-CM

## 2017-09-06 LAB — BASIC METABOLIC PANEL
Anion gap: 8 (ref 5–15)
BUN: 12 mg/dL (ref 6–20)
CHLORIDE: 103 mmol/L (ref 101–111)
CO2: 24 mmol/L (ref 22–32)
CREATININE: 0.69 mg/dL (ref 0.44–1.00)
Calcium: 8.1 mg/dL — ABNORMAL LOW (ref 8.9–10.3)
GFR calc Af Amer: >60 mL/min (ref >60)
GFR calc non Af Amer: >60 mL/min (ref >60)
GLUCOSE: 184 mg/dL — AB (ref 65–99)
Potassium: 3.4 mmol/L — ABNORMAL LOW (ref 3.5–5.1)
SODIUM: 135 mmol/L (ref 135–145)

## 2017-09-06 LAB — CBC
HEMATOCRIT: 36.3 % (ref 36.0–46.0)
HEMOGLOBIN: 11.6 g/dL — AB (ref 12.0–15.0)
MCH: 27.8 pg (ref 26.0–34.0)
MCHC: 32 g/dL (ref 30.0–36.0)
MCV: 87.1 fL (ref 78.0–100.0)
Platelets: 318 10*3/uL (ref 150–400)
RBC: 4.17 MIL/uL (ref 3.87–5.11)
RDW: 14 % (ref 11.5–15.5)
WBC: 19.7 10*3/uL — AB (ref 4.0–10.5)

## 2017-09-06 LAB — POCT I-STAT 3, ART BLOOD GAS (G3+)
ACID-BASE EXCESS: 2 mmol/L (ref 0.0–2.0)
Bicarbonate: 26.3 mmol/L (ref 20.0–28.0)
O2 Saturation: 100 %
PH ART: 7.427 (ref 7.350–7.450)
TCO2: 27 mmol/L (ref 22–32)
pCO2 arterial: 39.9 mmHg (ref 32.0–48.0)
pO2, Arterial: 338 mmHg — ABNORMAL HIGH (ref 83.0–108.0)

## 2017-09-06 LAB — POCT ACTIVATED CLOTTING TIME: Activated Clotting Time: 142 seconds

## 2017-09-06 LAB — GLUCOSE, CAPILLARY: Glucose-Capillary: 170 mg/dL — ABNORMAL HIGH (ref 65–99)

## 2017-09-06 MED ORDER — SODIUM CHLORIDE 0.9% FLUSH
10.0000 mL | Freq: Two times a day (BID) | INTRAVENOUS | Status: DC
  Administered 2017-09-06: 40 mL
  Administered 2017-09-09: 10 mL

## 2017-09-06 MED ORDER — CHLORHEXIDINE GLUCONATE CLOTH 2 % EX PADS
6.0000 | MEDICATED_PAD | Freq: Every day | CUTANEOUS | Status: DC
  Administered 2017-09-06 – 2017-09-09 (×4): 6 via TOPICAL

## 2017-09-06 MED ORDER — PANTOPRAZOLE SODIUM 40 MG PO PACK
40.0000 mg | PACK | Freq: Every day | ORAL | Status: DC
  Filled 2017-09-06: qty 20

## 2017-09-06 MED ORDER — SODIUM CHLORIDE 0.9% FLUSH: 10.0000 mL | INTRAVENOUS | Status: DC | PRN

## 2017-09-06 MED FILL — Lidocaine HCl Local Preservative Free (PF) Inj 1%: INTRAMUSCULAR | Qty: 30 | Status: AC

## 2017-09-06 NOTE — Progress Notes (Signed)
Progress Note  Patient Name: Brenda Lloyd Date of Encounter: 09/06/2017  Primary Cardiologist: New  Subjective   Intubated, sedation has been discontinued but she is still very calm and a little sleepy here and opens eyes and follows commands. On pressure support 5 cm H2O on the ventilator, looks very comfortable with low minute ventilation. Normal BP on very low dose of intravenous norepinephrine. Heart rate approximately 100 A few PVCs and scattered ventricular couplets/ventricular triplets seen in the last 24 hours. No sustained arrhythmia. No further fever.  Inpatient Medications    Scheduled Meds: . aspirin EC  81 mg Oral Daily  . atorvastatin  80 mg Oral q1800  . chlorhexidine gluconate (MEDLINE KIT)  15 mL Mouth Rinse BID  . Chlorhexidine Gluconate Cloth  6 each Topical Daily  . heparin  5,000 Units Subcutaneous Q8H  . mouth rinse  15 mL Mouth Rinse QID  . pantoprazole sodium  40 mg Per Tube Daily  . pneumococcal 23 valent vaccine  0.5 mL Intramuscular Tomorrow-1000  . sodium chloride flush  10-40 mL Intracatheter Q12H  . sodium chloride flush  3 mL Intravenous Q12H  . sodium chloride flush  3 mL Intravenous Q12H   Continuous Infusions: . sodium chloride    . aztreonam    . aztreonam Stopped (09/06/17 0944)  . fentaNYL infusion INTRAVENOUS Stopped (09/06/17 0758)  . levETIRAcetam Stopped (09/06/17 0929)  . midazolam (VERSED) infusion Stopped (09/06/17 0725)  . norepinephrine (LEVOPHED) Adult infusion 12 mcg/min (09/06/17 0817)  . vancomycin Stopped (09/06/17 0903)   PRN Meds: sodium chloride, acetaminophen, ipratropium-albuterol, nitroGLYCERIN, sodium chloride flush, sodium chloride flush   Vital Signs    Vitals:   09/06/17 0419 09/06/17 0500 09/06/17 0600 09/06/17 0730  BP:  (!) 112/57 (!) 110/47   Pulse:  (!) 108 (!) 107   Resp:  12 12   Temp:    99 F (37.2 C)  TempSrc:    Axillary  SpO2: 99% 99% 99%   Weight:  198 lb 6.6 oz (90 kg)    Height:         Intake/Output Summary (Last 24 hours) at 09/06/17 0952 Last data filed at 09/06/17 0600  Gross per 24 hour  Intake          1866.45 ml  Output              800 ml  Net          1066.45 ml   Filed Weights   09/04/17 1456 09/04/17 2028 09/06/17 0500  Weight: 165 lb (74.8 kg) 184 lb 4.8 oz (83.6 kg) 198 lb 6.6 oz (90 kg)    Telemetry    Tachycardia with occasional PVCs and ventricular couplets and triplets - Personally Reviewed  ECG    No new tracing, ordered one for today - Personally Reviewed  Physical Exam  Intubated, lightly sedated GEN: No acute distress.   Neck: No JVD Cardiac: RRR, no diastolic murmurs, rubs, or gallops. 3/6 systolic apical murmur radiating up towards the base, but not reaching the carotids Respiratory: Clear to auscultation bilaterally. GI: Soft, nontender, non-distended  MS: No edema; No deformity. Neuro:  Nonfocal  Psych: Normal affect   Labs    Chemistry Recent Labs Lab 09/04/17 1500 09/05/17 0602 09/05/17 1408  NA 140 139 140  K 3.6 3.5 3.8  CL 105 107 105  CO2 25 19*  --   GLUCOSE 154* 154* 137*  BUN '12 8 10  ' CREATININE 0.70 0.61  0.60  CALCIUM 9.7 8.8*  --   PROT 7.7 7.0  --   ALBUMIN 4.1 3.8  --   AST 27 38  --   ALT 33 31  --   ALKPHOS 82 85  --   BILITOT 0.8 1.5*  --   GFRNONAA >60 >60  --   GFRAA >60 >60  --   ANIONGAP 10 13  --      Hematology Recent Labs Lab 09/04/17 1500 09/05/17 1408 09/06/17 0405  WBC 13.6*  --  19.7*  RBC 4.57  --  4.17  HGB 13.2 11.6* 11.6*  HCT 38.7 34.0* 36.3  MCV 84.7  --  87.1  MCH 28.9  --  27.8  MCHC 34.1  --  32.0  RDW 13.6  --  14.0  PLT 301  --  318    Cardiac Enzymes Recent Labs Lab 09/04/17 1857 09/04/17 2105 09/05/17 0202 09/05/17 0602  TROPONINI 3.15* 2.35* 3.67* 1.88*   No results for input(s): TROPIPOC in the last 168 hours.   BNP Recent Labs Lab 09/04/17 1500 09/04/17 2105  BNP 43.2 147.3*     DDimer  Recent Labs Lab 09/04/17 1500  DDIMER  <0.27     Radiology    Ct Angio Head W Or Wo Contrast  Addendum Date: 09/05/2017   ADDENDUM REPORT: 09/05/2017 16:36 ADDENDUM: Bilateral pleural effusions. Electronically Signed   By: Logan Bores M.D.   On: 09/05/2017 16:36   Result Date: 09/05/2017 CLINICAL DATA:  Seizure today. Cardiac catheterization earlier this morning. EXAM: CT ANGIOGRAPHY HEAD AND NECK TECHNIQUE: Multidetector CT imaging of the head and neck was performed using the standard protocol during bolus administration of intravenous contrast. Multiplanar CT image reconstructions and MIPs were obtained to evaluate the vascular anatomy. Carotid stenosis measurements (when applicable) are obtained utilizing NASCET criteria, using the distal internal carotid diameter as the denominator. CONTRAST:  90 mL Isovue 370 COMPARISON:  None. FINDINGS: CT HEAD FINDINGS Brain: There is no evidence of acute infarct, intracranial hemorrhage, mass, midline shift, or extra-axial fluid collection. The ventricles and sulci are normal. Vascular: Minimal calcified atherosclerosis at the skullbase. Skull: No fracture or destructive osseous lesion. Sinuses: Mild mucosal thickening in the maxillary sinuses. Clear mastoid air cells. Orbits: Unremarkable. Review of the MIP images confirms the above findings CTA NECK FINDINGS Aortic arch: Standard 3 vessel aortic arch. Widely patent brachiocephalic and subclavian arteries. Right carotid system: Patent without evidence of stenosis, dissection, or significant atherosclerosis. Left carotid system: Patent without evidence of stenosis or dissection. Mild calcified plaque at the carotid bulb. Vertebral arteries: Patent without evidence of stenosis or dissection. The right vertebral artery is minimally larger than the left. Small focal ossification is incidentally noted in the soft tissues adjacent to the left V3 segment at the C1 level, not affecting the vessel. Skeleton: Incidental incomplete fusion of the C1 posterior ring,  a normal variant. Moderate disc degeneration at C5-6 and C6-7. Other neck: Asymmetric enlargement and heterogeneity of the right thyroid lobe with coarse calcification. Retained secretions in the pharynx with an endotracheal tube in place, terminating well above the carina. Upper chest: Partially visualized small bilateral pleural effusions and dependent atelectasis. Review of the MIP images confirms the above findings CTA HEAD FINDINGS Anterior circulation: The internal carotid arteries are widely patent from skullbase to carotid termini. ACAs and MCAs are patent with mild branch vessel irregularity but no evidence of proximal branch occlusion or significant stenosis. No aneurysm. Posterior circulation: The intracranial vertebral arteries  are widely patent to the basilar. Patent right PICA, bilateral AICA, bilateral SCA origins are identified. The basilar artery is widely patent. There is a small right posterior communicating artery. PCAs are patent without evidence of significant stenosis. No aneurysm. Venous sinuses: As permitted by contrast timing, patent. Anatomic variants: None. Delayed phase: Not performed. Review of the MIP images confirms the above findings IMPRESSION: 1. No emergent large vessel occlusion. 2. Mild intracranial and extracranial atherosclerosis without significant stenosis. 3. No evidence of acute intracranial abnormality on CT. These results were called by telephone at the time of interpretation on 09/05/2017 at 4:12 pm to Dr. Amie Portland , who verbally acknowledged these results. Electronically Signed: By: Logan Bores M.D. On: 09/05/2017 16:13   Dg Chest 2 View  Result Date: 09/04/2017 CLINICAL DATA:  66 year old female with progressive central chest pain and shortness of breath EXAM: CHEST  2 VIEW COMPARISON:  None. FINDINGS: The lungs are clear and negative for focal airspace consolidation, pulmonary edema or suspicious pulmonary nodule. No pleural effusion or pneumothorax. Cardiac  and mediastinal contours are within normal limits. No acute fracture or lytic or blastic osseous lesions. The visualized upper abdominal bowel gas pattern is unremarkable. IMPRESSION: Negative chest x-ray. Electronically Signed   By: Jacqulynn Cadet M.D.   On: 09/04/2017 15:29   Ct Angio Neck W Or Wo Contrast  Addendum Date: 09/05/2017   ADDENDUM REPORT: 09/05/2017 16:36 ADDENDUM: Bilateral pleural effusions. Electronically Signed   By: Logan Bores M.D.   On: 09/05/2017 16:36   Result Date: 09/05/2017 CLINICAL DATA:  Seizure today. Cardiac catheterization earlier this morning. EXAM: CT ANGIOGRAPHY HEAD AND NECK TECHNIQUE: Multidetector CT imaging of the head and neck was performed using the standard protocol during bolus administration of intravenous contrast. Multiplanar CT image reconstructions and MIPs were obtained to evaluate the vascular anatomy. Carotid stenosis measurements (when applicable) are obtained utilizing NASCET criteria, using the distal internal carotid diameter as the denominator. CONTRAST:  90 mL Isovue 370 COMPARISON:  None. FINDINGS: CT HEAD FINDINGS Brain: There is no evidence of acute infarct, intracranial hemorrhage, mass, midline shift, or extra-axial fluid collection. The ventricles and sulci are normal. Vascular: Minimal calcified atherosclerosis at the skullbase. Skull: No fracture or destructive osseous lesion. Sinuses: Mild mucosal thickening in the maxillary sinuses. Clear mastoid air cells. Orbits: Unremarkable. Review of the MIP images confirms the above findings CTA NECK FINDINGS Aortic arch: Standard 3 vessel aortic arch. Widely patent brachiocephalic and subclavian arteries. Right carotid system: Patent without evidence of stenosis, dissection, or significant atherosclerosis. Left carotid system: Patent without evidence of stenosis or dissection. Mild calcified plaque at the carotid bulb. Vertebral arteries: Patent without evidence of stenosis or dissection. The right  vertebral artery is minimally larger than the left. Small focal ossification is incidentally noted in the soft tissues adjacent to the left V3 segment at the C1 level, not affecting the vessel. Skeleton: Incidental incomplete fusion of the C1 posterior ring, a normal variant. Moderate disc degeneration at C5-6 and C6-7. Other neck: Asymmetric enlargement and heterogeneity of the right thyroid lobe with coarse calcification. Retained secretions in the pharynx with an endotracheal tube in place, terminating well above the carina. Upper chest: Partially visualized small bilateral pleural effusions and dependent atelectasis. Review of the MIP images confirms the above findings CTA HEAD FINDINGS Anterior circulation: The internal carotid arteries are widely patent from skullbase to carotid termini. ACAs and MCAs are patent with mild branch vessel irregularity but no evidence of proximal branch occlusion  or significant stenosis. No aneurysm. Posterior circulation: The intracranial vertebral arteries are widely patent to the basilar. Patent right PICA, bilateral AICA, bilateral SCA origins are identified. The basilar artery is widely patent. There is a small right posterior communicating artery. PCAs are patent without evidence of significant stenosis. No aneurysm. Venous sinuses: As permitted by contrast timing, patent. Anatomic variants: None. Delayed phase: Not performed. Review of the MIP images confirms the above findings IMPRESSION: 1. No emergent large vessel occlusion. 2. Mild intracranial and extracranial atherosclerosis without significant stenosis. 3. No evidence of acute intracranial abnormality on CT. These results were called by telephone at the time of interpretation on 09/05/2017 at 4:12 pm to Dr. Amie Portland , who verbally acknowledged these results. Electronically Signed: By: Logan Bores M.D. On: 09/05/2017 16:13   Dg Chest Port 1 View  Result Date: 09/05/2017 CLINICAL DATA:  Right central line  placement EXAM: PORTABLE CHEST 1 VIEW COMPARISON:  09/05/2017 FINDINGS: Endotracheal tube with tip measuring 4.5 cm above the carina. Interval placement of a right central venous catheter. Tip localizes to the right atrium, about 2.3 cm below the expected location of the cavoatrial junction. Consider retracting the catheter 2.5 cm and placement at the cavoatrial junction is desired. Heart size and pulmonary vascularity are normal. Lungs are clear and expanded. No blunting of costophrenic angles. No pneumothorax. Mediastinal contours appear intact. IMPRESSION: Right central venous catheter has been placed. Tip projects over the right atrium about 2.3 cm below the expected location of the cavoatrial junction. Electronically Signed   By: Lucienne Capers M.D.   On: 09/05/2017 21:30   Dg Chest Port 1 View  Result Date: 09/05/2017 CLINICAL DATA:  66 year old female status post intubation EXAM: PORTABLE CHEST 1 VIEW COMPARISON:  Prior chest x-ray earlier today at 5:17 a.m. FINDINGS: Interval intubation. The endotracheal tube is well positioned 4.1 cm above the carina. Stable cardiomegaly. Persistent and slightly increased pulmonary vascular congestion with mild interstitial edema. No pneumothorax or focal airspace consolidation. No acute osseous abnormality. IMPRESSION: 1. The endotracheal tube is well positioned with the tip 4.1 cm above the carina. 2. Mild CHF with cardiomegaly, pulmonary vascular congestion and interstitial edema. Electronically Signed   By: Jacqulynn Cadet M.D.   On: 09/05/2017 09:34   Dg Chest Port 1 View  Result Date: 09/05/2017 CLINICAL DATA:  Increased shortness of breath. History of hypertension. EXAM: PORTABLE CHEST 1 VIEW COMPARISON:  Chest radiograph September 04, 2017 FINDINGS: Cardiac silhouette is upper limits of normal size. Mediastinal silhouette is nonsuspicious. Increasing interstitial prominence without pleural effusion or focal consolidation. No pneumothorax. Soft tissue planes  and included osseous structures are nonsuspicious. IMPRESSION: New interstitial prominence seen with pulmonary edema and atypical infection. No focal consolidation. Electronically Signed   By: Elon Alas M.D.   On: 09/05/2017 05:43    Cardiac Studies   Cardiac cath 09/05/2017  Dist LAD lesion, 65 %stenosed. Diffuse tapering from the mid vessel distally around the apex. The vessel has the appearance of potentially diffuse intramural thrombus.  There is moderate to severe left ventricular systolic dysfunction. The left ventricular ejection fraction is 25-35% by visual estimate. - In a Takotsubo pattern  LV end diastolic pressure is moderately elevated. 22 mmHg  There is moderate (3+) mitral regurgitation.  There is no aortic valve stenosis.   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 There is pruning  of the septal branches and area that should be covered with with a diagonal branch is empty.  The patient was somewhat hypotensive due to sedation in the Cath Lab, but does not have the appearance of being a cardiac shock. Her blood pressures were mostly in the 110s. I did start low-dose Levophed to allow for sedation as she became agitated with the ventilator in place.  The patient was transferred back to the CCU for supportive care. ABG looked excellent. I suspect that she would benefit from at least one day of rest and potentially another dose of IV Lasix depending on her pressures.  As her pressure tolerate, we can wean off Levophed and potentially consider adding either amlodipine or Imdur for her definitive diffuse LAD disease.   Patient Profile     66 y.o. female with long-standing history of "mitral valve prolapse" and murmur, presents with acute pulmonary edema preceded by several days of chest discomfort, found and  angiography to have diffuse attenuation of the mid-distal LAD without focal lesions and wall motion abnormalities consistent with takotsubo sd. Minor increase in cardiac troponin I to peak of 3.6.  Assessment & Plan    1. Systolic heart failure, acute: Seems to be hemodynamically well compensated now. Requiring minimal catecholamines support. Ventilating well and likely to come off mechanical support today. Note that she has an essentially flat fluid balance since admission. Recorded weights appear very inaccurate (ranging in 3 days from [phone]). May need additional diuretics after extubation. Suspect blood pressure will increase following extubation and we will be able to stop her inotropes and maybe give her some intravenous nitroglycerin. 2. 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 Her distal LAD does appear unusually attenuated, consider coronary spasm. The increase in cardiac enzymes is tiny in proportion to the extent of wall motion abnormality. If this diagnosis is accurate, suspect she will gradually improve over the next 4-5 days. Would definitely repeat an echocardiogram towards the end of this week. Numerous emotional/social stressors at home. 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 She was performing heavy physical exertion just before the admission pushing wheelchairs with fully grown adults with physical/mental disabilities during event outside in the heat. 3. Mitral regurgitation: The murmur is quite prominent and holosystolic. Will need to reevaluate her mitral valve. Its also possible that she had acute systolic heart failure due to acute mitral insufficiency following chordal rupture. Discussed with her husband that she might require transesophageal echocardiography at some point. 4. Thyroid nodule: He has a right thyroid nodule  incidentally discovered during CT scanning of the neck and has a suppressed TSH, but with normal free T4. Difficult to interpret the thyroid studies during the current acute illness. Will need endocrinology evaluation after recovery from the acute illness.  For questions or updates, please contact [email or web address] consult [email or web address] for contact info under Cardiology/STEMI. Daytime calls, contact the Day Call APP (6a-8a) or assigned team (Teams A-D) provider (7:30a - 5p). All other daytime calls (7:30-5p), contact the Card Master @ [phone]   Nighttime calls, contact the assigned APP (5p-8p) or MD (6:30p-8p). Overnight calls (8p-6a), contact the on call Fellow @ [phone]      Signed, MSanda Klein MD  09/06/2017, 9:52 AM

## 2017-09-06 NOTE — Procedures (Signed)
Extubation Procedure Note  Patient Details:   Name: Brenda Lloyd DOB: 06-Jun-1951 MRN: [id number]   Airway Documentation:     Evaluation  O2 sats: stable throughout Complications: No apparent complications Patient did tolerate procedure well. Bilateral Breath Sounds: Clear, Diminished  Placed on 4l/min [place] Incentive spirometer instructed.   Yes  Newt LukesGroendal, Kaylla Cobos Ann 09/06/2017, 11:10 AM

## 2017-09-06 NOTE — Progress Notes (Signed)
Subjective: Now arousable to a drowsy state and able to follow some commands. Sedation has been discontinued.   The patient is a 66 y.o. female with PMH of HTN, who has been admitted to the hospital for evaluation of chest pain and NSTEMI, and is s/p cardiac cath this morning. 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 She was emergently taken for the cardiac cath after she woke up Sunday morning and had chest pain, which was relieved by some morphine but she continued to complain of chest pain and a bedside echo that showed a left ventricular ejection fraction of 40%, she became hypoxic and continued to remain hypoxic. She returned to the ICU from cardiac cath with medication. She was noted to be actively seizing and extensor posturing by Dr. Rayann Heman when he came to check on her Sunday AM after the cath. Neurology was consulted for concern for seizures. She was given multiple doses of ativan and was on versed drip and fentanyl drip. Also received multiple fentanyl boluses. She has no history of seizures. No history of drug/alcohol abuse. No history of trauma.   Objective: Current vital signs: BP (!) 110/47   Pulse (!) 107   Temp 99 F (37.2 C) (Axillary)   Resp 12   Ht '5\' 9"'  (1.753 m)   Wt 90 kg (198 lb 6.6 oz)   SpO2 99%   BMI 29.30 kg/m  Vital signs in last 24 hours: Temp:  [98.7 F (37.1 C)-102.2 F (39 C)] 99 F (37.2 C) (09/10 0730) Pulse Rate:  [80-117] 107 (09/10 0600) Resp:  [11-21] 12 (09/10 0600) BP: (82-140)/(32-101) 110/47 (09/10 0600) SpO2:  [95 %-100 %] 99 % (09/10 0600) Arterial Line BP: (81-145)/(51-77) 119/57 (09/10 0600) FiO2 (%):  [40 %] 40 % (09/10 0419) Weight:  [90 kg (198 lb 6.6 oz)] 90 kg (198 lb 6.6 oz) (09/10 0500)  Intake/Output from previous day: 09/09 0701 - 09/10 0700 In: 1954.6 [I.V.:1639.6; IV Piggyback:315] Out: 3491 [Urine:1775] Intake/Output this shift: No intake/output data recorded. Nutritional status: Diet NPO time specified  Neurologic  Exam: Mental Status: Arousable to a drowsy state. Follows approximately 10% of simple motor commands. Does not nod head yes or no to questions; no attempts at meaningful communication.  Cranial Nerves: II:  Does not blink to threat. PERRL. Tracks examiner visually at times.  III,IV, VI: Ptosis not present. Eyes conjugate. No nystagmus.  V,VII: Face grossly symmetric.  VIII: Follows some commands.  Motor/Sensory: Waves arms equally. Moves lower extremities to noxious, left more briskly than right.  Deep Tendon Reflexes:  1+ bilateral upper and lower extremities.  Cerebellar/Gait: Unable to assess  Lab Results: Results for orders placed or performed during the hospital encounter of 09/04/17 (from the past 48 hour(s))  Basic metabolic panel     Status: Abnormal   Collection Time: 09/04/17  3:00 PM  Result Value Ref Range   Sodium 140 135 - 145 mmol/L   Potassium 3.6 3.5 - 5.1 mmol/L   Chloride 105 101 - 111 mmol/L   CO2 25 22 - 32 mmol/L   Glucose, Bld 154 (H) 65 - 99 mg/dL   BUN 12 6 - 20 mg/dL   Creatinine, Ser 0.70 0.44 - 1.00 mg/dL   Calcium 9.7 8.9 - 10.3 mg/dL   GFR calc non Af Amer >60 >60 mL/min   GFR calc Af Amer >60 >60 mL/min    Comment: (NOTE) The eGFR has been calculated using the CKD EPI equation. This calculation has not been validated in all  clinical situations. eGFR's persistently <60 mL/min signify possible Chronic Kidney Disease.    Anion gap 10 5 - 15  CBC     Status: Abnormal   Collection Time: 09/04/17  3:00 PM  Result Value Ref Range   WBC 13.6 (H) 4.0 - 10.5 K/uL   RBC 4.57 3.87 - 5.11 MIL/uL   Hemoglobin 13.2 12.0 - 15.0 g/dL   HCT 38.7 36.0 - 46.0 %   MCV 84.7 78.0 - 100.0 fL   MCH 28.9 26.0 - 34.0 pg   MCHC 34.1 30.0 - 36.0 g/dL   RDW 13.6 11.5 - 15.5 %   Platelets 301 150 - 400 K/uL  Troponin I     Status: Abnormal   Collection Time: 09/04/17  3:00 PM  Result Value Ref Range   Troponin I 1.21 (HH) <0.03 ng/mL    Comment: CRITICAL RESULT  CALLED TO, READ BACK BY AND VERIFIED WITHMickie Hillier RN [phone] [id number] PHILLIPS C   Hepatic function panel     Status: None   Collection Time: 09/04/17  3:00 PM  Result Value Ref Range   Total Protein 7.7 6.5 - 8.1 g/dL   Albumin 4.1 3.5 - 5.0 g/dL   AST 27 15 - 41 U/L   ALT 33 14 - 54 U/L   Alkaline Phosphatase 82 38 - 126 U/L   Total Bilirubin 0.8 0.3 - 1.2 mg/dL   Bilirubin, Direct 0.1 0.1 - 0.5 mg/dL   Indirect Bilirubin 0.7 0.3 - 0.9 mg/dL  Lipase, blood     Status: None   Collection Time: 09/04/17  3:00 PM  Result Value Ref Range   Lipase 42 11 - 51 U/L  Protime-INR     Status: None   Collection Time: 09/04/17  3:00 PM  Result Value Ref Range   Prothrombin Time 13.5 11.4 - 15.2 seconds   INR 1.04   D-dimer, quantitative     Status: None   Collection Time: 09/04/17  3:00 PM  Result Value Ref Range   D-Dimer, Quant <0.27 0.00 - 0.50 ug/mL-FEU    Comment: (NOTE) At the manufacturer cut-off of 0.50 ug/mL FEU, this assay has been documented to exclude PE with a sensitivity and negative predictive value of 97 to 99%.  At this time, this assay has not been approved by the FDA to exclude DVT/VTE. Results should be correlated with clinical presentation.   Brain natriuretic peptide     Status: None   Collection Time: 09/04/17  3:00 PM  Result Value Ref Range   B Natriuretic Peptide 43.2 0.0 - 100.0 pg/mL  TSH     Status: Abnormal   Collection Time: 09/04/17  3:53 PM  Result Value Ref Range   TSH 0.102 (L) 0.350 - 4.500 uIU/mL    Comment: Performed by a 3rd Generation assay with a functional sensitivity of <=0.01 uIU/mL. Performed at [facility], [place] [address], [place], [place] [address]   Hemoglobin A1c     Status: None   Collection Time: 09/04/17  3:53 PM  Result Value Ref Range   Hgb A1c MFr Bld 5.6 4.8 - 5.6 %    Comment: (NOTE) Pre diabetes:          5.7%-6.4% Diabetes:              >6.4% Glycemic control for   <7.0% adults with diabetes    Mean Plasma  Glucose 114.02 mg/dL    Comment: Performed at [facility]  [address], [place], [place] [address]  Troponin I     Status: Abnormal   Collection Time: 09/04/17  6:57 PM  Result Value Ref Range   Troponin I 3.15 (HH) <0.03 ng/mL    Comment: CRITICAL RESULT CALLED TO, READ BACK BY AND VERIFIED WITH: Stark Jock RN '@1945'  09/04/2017 OLSONM   Brain natriuretic peptide     Status: Abnormal   Collection Time: 09/04/17  9:05 PM  Result Value Ref Range   B Natriuretic Peptide 147.3 (H) 0.0 - 100.0 pg/mL  TSH     Status: Abnormal   Collection Time: 09/04/17  9:05 PM  Result Value Ref Range   TSH 0.107 (L) 0.350 - 4.500 uIU/mL    Comment: Performed by a 3rd Generation assay with a functional sensitivity of <=0.01 uIU/mL.  Troponin I     Status: Abnormal   Collection Time: 09/04/17  9:05 PM  Result Value Ref Range   Troponin I 2.35 (HH) <0.03 ng/mL    Comment: CRITICAL RESULT CALLED TO, READ BACK BY AND VERIFIED WITH: JADJI,R RN 09/04/2017 2225 JORDANS   Hemoglobin A1c     Status: Abnormal   Collection Time: 09/04/17  9:05 PM  Result Value Ref Range   Hgb A1c MFr Bld 5.7 (H) 4.8 - 5.6 %    Comment: (NOTE) Pre diabetes:          5.7%-6.4% Diabetes:              >6.4% Glycemic control for   <7.0% adults with diabetes    Mean Plasma Glucose 116.89 mg/dL  Lipid panel     Status: Abnormal   Collection Time: 09/04/17  9:05 PM  Result Value Ref Range   Cholesterol 187 0 - 200 mg/dL   Triglycerides 99 <150 mg/dL   HDL 34 (L) >40 mg/dL   Total CHOL/HDL Ratio 5.5 RATIO   VLDL 20 0 - 40 mg/dL   LDL Cholesterol 133 (H) 0 - 99 mg/dL    Comment:        Total Cholesterol/HDL:CHD Risk Coronary Heart Disease Risk Table                     Men   Women  1/2 Average Risk   3.4   3.3  Average Risk       5.0   4.4  2 X Average Risk   9.6   7.1  3 X Average Risk  23.4   11.0        Use the calculated Patient Ratio above and the CHD Risk Table to determine the patient's CHD Risk.         ATP III CLASSIFICATION (LDL):  <100     mg/dL   Optimal  100-129  mg/dL   Near or Above                    Optimal  130-159  mg/dL   Borderline  160-189  mg/dL   High  >190     mg/dL   Very High   Heparin level (unfractionated)     Status: None   Collection Time: 09/04/17  9:05 PM  Result Value Ref Range   Heparin Unfractionated 0.42 0.30 - 0.70 IU/mL    Comment:        IF HEPARIN RESULTS ARE BELOW EXPECTED VALUES, AND PATIENT DOSAGE HAS BEEN CONFIRMED, SUGGEST FOLLOW UP TESTING OF ANTITHROMBIN III LEVELS.   Troponin I     Status: Abnormal  Collection Time: 09/05/17  2:02 AM  Result Value Ref Range   Troponin I 3.67 (HH) <0.03 ng/mL    Comment: CRITICAL VALUE NOTED.  VALUE IS CONSISTENT WITH PREVIOUSLY REPORTED AND CALLED VALUE.  Troponin I     Status: Abnormal   Collection Time: 09/05/17  6:02 AM  Result Value Ref Range   Troponin I 1.88 (HH) <0.03 ng/mL    Comment: CRITICAL VALUE NOTED.  VALUE IS CONSISTENT WITH PREVIOUSLY REPORTED AND CALLED VALUE.  Comprehensive metabolic panel     Status: Abnormal   Collection Time: 09/05/17  6:02 AM  Result Value Ref Range   Sodium 139 135 - 145 mmol/L   Potassium 3.5 3.5 - 5.1 mmol/L   Chloride 107 101 - 111 mmol/L   CO2 19 (L) 22 - 32 mmol/L   Glucose, Bld 154 (H) 65 - 99 mg/dL   BUN 8 6 - 20 mg/dL   Creatinine, Ser 0.61 0.44 - 1.00 mg/dL   Calcium 8.8 (L) 8.9 - 10.3 mg/dL   Total Protein 7.0 6.5 - 8.1 g/dL   Albumin 3.8 3.5 - 5.0 g/dL   AST 38 15 - 41 U/L   ALT 31 14 - 54 U/L   Alkaline Phosphatase 85 38 - 126 U/L   Total Bilirubin 1.5 (H) 0.3 - 1.2 mg/dL   GFR calc non Af Amer >60 >60 mL/min   GFR calc Af Amer >60 >60 mL/min    Comment: (NOTE) The eGFR has been calculated using the CKD EPI equation. This calculation has not been validated in all clinical situations. eGFR's persistently <60 mL/min signify possible Chronic Kidney Disease.    Anion gap 13 5 - 15  Protime-INR     Status: None   Collection Time:  09/05/17  6:02 AM  Result Value Ref Range   Prothrombin Time 14.0 11.4 - 15.2 seconds   INR 1.09   Heparin level (unfractionated)     Status: Abnormal   Collection Time: 09/05/17  6:02 AM  Result Value Ref Range   Heparin Unfractionated 0.15 (L) 0.30 - 0.70 IU/mL    Comment:        IF HEPARIN RESULTS ARE BELOW EXPECTED VALUES, AND PATIENT DOSAGE HAS BEEN CONFIRMED, SUGGEST FOLLOW UP TESTING OF ANTITHROMBIN III LEVELS.   MRSA PCR Screening     Status: None   Collection Time: 09/05/17  7:30 AM  Result Value Ref Range   MRSA by PCR NEGATIVE NEGATIVE    Comment:        The GeneXpert MRSA Assay (FDA approved for NASAL specimens only), is one component of a comprehensive MRSA colonization surveillance program. It is not intended to diagnose MRSA infection nor to guide or monitor treatment for MRSA infections.   I-STAT 3, arterial blood gas (G3+)     Status: Abnormal   Collection Time: 09/05/17  8:08 AM  Result Value Ref Range   pH, Arterial 7.427 7.350 - 7.450   pCO2 arterial 39.9 32.0 - 48.0 mmHg   pO2, Arterial 338.0 (H) 83.0 - 108.0 mmHg   Bicarbonate 26.3 20.0 - 28.0 mmol/L   TCO2 27 22 - 32 mmol/L   O2 Saturation 100.0 %   Acid-Base Excess 2.0 0.0 - 2.0 mmol/L   Patient temperature HIDE    Sample type ARTERIAL   POCT Activated clotting time     Status: None   Collection Time: 09/05/17  8:26 AM  Result Value Ref Range   Activated Clotting Time 142 seconds  Glucose, capillary  Status: Abnormal   Collection Time: 09/05/17  9:50 AM  Result Value Ref Range   Glucose-Capillary 141 (H) 65 - 99 mg/dL  T4, free     Status: None   Collection Time: 09/05/17  9:57 AM  Result Value Ref Range   Free T4 1.12 0.61 - 1.12 ng/dL    Comment: (NOTE) Biotin ingestion may interfere with free T4 tests. If the results are inconsistent with the TSH level, previous test results, or the clinical presentation, then consider biotin interference. If needed, order repeat testing after  stopping biotin.   I-STAT 3, arterial blood gas (G3+)     Status: Abnormal   Collection Time: 09/05/17 10:52 AM  Result Value Ref Range   pH, Arterial 7.480 (H) 7.350 - 7.450   pCO2 arterial 34.0 32.0 - 48.0 mmHg   pO2, Arterial 108.0 83.0 - 108.0 mmHg   Bicarbonate 25.3 20.0 - 28.0 mmol/L   TCO2 26 22 - 32 mmol/L   O2 Saturation 99.0 %   Acid-Base Excess 2.0 0.0 - 2.0 mmol/L   Patient temperature HIDE    Collection site RADIAL, ALLEN'S TEST ACCEPTABLE    Drawn by Operator    Sample type ARTERIAL   I-STAT 3, arterial blood gas (G3+)     Status: Abnormal   Collection Time: 09/05/17  2:04 PM  Result Value Ref Range   pH, Arterial 7.352 7.350 - 7.450   pCO2 arterial 47.2 32.0 - 48.0 mmHg   pO2, Arterial 81.0 (L) 83.0 - 108.0 mmHg   Bicarbonate 25.7 20.0 - 28.0 mmol/L   TCO2 27 22 - 32 mmol/L   O2 Saturation 94.0 %   Patient temperature 102.2 F    Sample type ARTERIAL   I-STAT, chem 8     Status: Abnormal   Collection Time: 09/05/17  2:08 PM  Result Value Ref Range   Sodium 140 135 - 145 mmol/L   Potassium 3.8 3.5 - 5.1 mmol/L   Chloride 105 101 - 111 mmol/L   BUN 10 6 - 20 mg/dL   Creatinine, Ser 0.60 0.44 - 1.00 mg/dL   Glucose, Bld 137 (H) 65 - 99 mg/dL   Calcium, Ion 1.15 1.15 - 1.40 mmol/L   TCO2 26 22 - 32 mmol/L   Hemoglobin 11.6 (L) 12.0 - 15.0 g/dL   HCT 34.0 (L) 36.0 - 46.0 %  Urinalysis, Routine w reflex microscopic     Status: Abnormal   Collection Time: 09/05/17  6:46 PM  Result Value Ref Range   Color, Urine YELLOW YELLOW   APPearance CLEAR CLEAR   Specific Gravity, Urine >1.046 (H) 1.005 - 1.030   pH 5.0 5.0 - 8.0   Glucose, UA NEGATIVE NEGATIVE mg/dL   Hgb urine dipstick NEGATIVE NEGATIVE   Bilirubin Urine NEGATIVE NEGATIVE   Ketones, ur 20 (A) NEGATIVE mg/dL   Protein, ur 30 (A) NEGATIVE mg/dL   Nitrite NEGATIVE NEGATIVE   Leukocytes, UA NEGATIVE NEGATIVE   RBC / HPF 0-5 0 - 5 RBC/hpf   WBC, UA 0-5 0 - 5 WBC/hpf   Bacteria, UA NONE SEEN NONE SEEN    Squamous Epithelial / LPF 0-5 (A) NONE SEEN   Mucus PRESENT   Culture, respiratory (NON-Expectorated)     Status: None (Preliminary result)   Collection Time: 09/06/17 12:01 AM  Result Value Ref Range   Specimen Description TRACHEAL ASPIRATE    Special Requests NONE    Gram Stain      FEW WBC PRESENT, PREDOMINANTLY PMN FEW  GRAM POSITIVE COCCI    Culture PENDING    Report Status PENDING   CBC     Status: Abnormal   Collection Time: 09/06/17  4:05 AM  Result Value Ref Range   WBC 19.7 (H) 4.0 - 10.5 K/uL   RBC 4.17 3.87 - 5.11 MIL/uL   Hemoglobin 11.6 (L) 12.0 - 15.0 g/dL   HCT 36.3 36.0 - 46.0 %   MCV 87.1 78.0 - 100.0 fL   MCH 27.8 26.0 - 34.0 pg   MCHC 32.0 30.0 - 36.0 g/dL   RDW 14.0 11.5 - 15.5 %   Platelets 318 150 - 400 K/uL  Glucose, capillary     Status: Abnormal   Collection Time: 09/06/17  7:28 AM  Result Value Ref Range   Glucose-Capillary 170 (H) 65 - 99 mg/dL    Recent Results (from the past 240 hour(s))  MRSA PCR Screening     Status: None   Collection Time: 09/05/17  7:30 AM  Result Value Ref Range Status   MRSA by PCR NEGATIVE NEGATIVE Final    Comment:        The GeneXpert MRSA Assay (FDA approved for NASAL specimens only), is one component of a comprehensive MRSA colonization surveillance program. It is not intended to diagnose MRSA infection nor to guide or monitor treatment for MRSA infections.   Culture, respiratory (NON-Expectorated)     Status: None (Preliminary result)   Collection Time: 09/06/17 12:01 AM  Result Value Ref Range Status   Specimen Description TRACHEAL ASPIRATE  Final   Special Requests NONE  Final   Gram Stain   Final    FEW WBC PRESENT, PREDOMINANTLY PMN FEW GRAM POSITIVE COCCI    Culture PENDING  Incomplete   Report Status PENDING  Incomplete    Lipid Panel  Recent Labs  09/04/17 2105  CHOL 187  TRIG 99  HDL 34*  CHOLHDL 5.5  VLDL 20  LDLCALC 133*    Studies/Results: Ct Angio Head W Or Wo  Contrast  Addendum Date: 09/05/2017   ADDENDUM REPORT: 09/05/2017 16:36 ADDENDUM: Bilateral pleural effusions. Electronically Signed   By: Logan Bores M.D.   On: 09/05/2017 16:36   Result Date: 09/05/2017 CLINICAL DATA:  Seizure today. Cardiac catheterization earlier this morning. EXAM: CT ANGIOGRAPHY HEAD AND NECK TECHNIQUE: Multidetector CT imaging of the head and neck was performed using the standard protocol during bolus administration of intravenous contrast. Multiplanar CT image reconstructions and MIPs were obtained to evaluate the vascular anatomy. Carotid stenosis measurements (when applicable) are obtained utilizing NASCET criteria, using the distal internal carotid diameter as the denominator. CONTRAST:  90 mL Isovue 370 COMPARISON:  None. FINDINGS: CT HEAD FINDINGS Brain: There is no evidence of acute infarct, intracranial hemorrhage, mass, midline shift, or extra-axial fluid collection. The ventricles and sulci are normal. Vascular: Minimal calcified atherosclerosis at the skullbase. Skull: No fracture or destructive osseous lesion. Sinuses: Mild mucosal thickening in the maxillary sinuses. Clear mastoid air cells. Orbits: Unremarkable. Review of the MIP images confirms the above findings CTA NECK FINDINGS Aortic arch: Standard 3 vessel aortic arch. Widely patent brachiocephalic and subclavian arteries. Right carotid system: Patent without evidence of stenosis, dissection, or significant atherosclerosis. Left carotid system: Patent without evidence of stenosis or dissection. Mild calcified plaque at the carotid bulb. Vertebral arteries: Patent without evidence of stenosis or dissection. The right vertebral artery is minimally larger than the left. Small focal ossification is incidentally noted in the soft tissues adjacent to the left V3  segment at the C1 level, not affecting the vessel. Skeleton: Incidental incomplete fusion of the C1 posterior ring, a normal variant. Moderate disc degeneration at  C5-6 and C6-7. Other neck: Asymmetric enlargement and heterogeneity of the right thyroid lobe with coarse calcification. Retained secretions in the pharynx with an endotracheal tube in place, terminating well above the carina. Upper chest: Partially visualized small bilateral pleural effusions and dependent atelectasis. Review of the MIP images confirms the above findings CTA HEAD FINDINGS Anterior circulation: The internal carotid arteries are widely patent from skullbase to carotid termini. ACAs and MCAs are patent with mild branch vessel irregularity but no evidence of proximal branch occlusion or significant stenosis. No aneurysm. Posterior circulation: The intracranial vertebral arteries are widely patent to the basilar. Patent right PICA, bilateral AICA, bilateral SCA origins are identified. The basilar artery is widely patent. There is a small right posterior communicating artery. PCAs are patent without evidence of significant stenosis. No aneurysm. Venous sinuses: As permitted by contrast timing, patent. Anatomic variants: None. Delayed phase: Not performed. Review of the MIP images confirms the above findings IMPRESSION: 1. No emergent large vessel occlusion. 2. Mild intracranial and extracranial atherosclerosis without significant stenosis. 3. No evidence of acute intracranial abnormality on CT. These results were called by telephone at the time of interpretation on 09/05/2017 at 4:12 pm to Dr. Amie Portland , who verbally acknowledged these results. Electronically Signed: By: Logan Bores M.D. On: 09/05/2017 16:13   Dg Chest 2 View  Result Date: 09/04/2017 CLINICAL DATA:  66 year old female with progressive central chest pain and shortness of breath EXAM: CHEST  2 VIEW COMPARISON:  None. FINDINGS: The lungs are clear and negative for focal airspace consolidation, pulmonary edema or suspicious pulmonary nodule. No pleural effusion or pneumothorax. Cardiac and mediastinal contours are within normal limits.  No acute fracture or lytic or blastic osseous lesions. The visualized upper abdominal bowel gas pattern is unremarkable. IMPRESSION: Negative chest x-ray. Electronically Signed   By: Jacqulynn Cadet M.D.   On: 09/04/2017 15:29   Ct Angio Neck W Or Wo Contrast  Addendum Date: 09/05/2017   ADDENDUM REPORT: 09/05/2017 16:36 ADDENDUM: Bilateral pleural effusions. Electronically Signed   By: Logan Bores M.D.   On: 09/05/2017 16:36   Result Date: 09/05/2017 CLINICAL DATA:  Seizure today. Cardiac catheterization earlier this morning. EXAM: CT ANGIOGRAPHY HEAD AND NECK TECHNIQUE: Multidetector CT imaging of the head and neck was performed using the standard protocol during bolus administration of intravenous contrast. Multiplanar CT image reconstructions and MIPs were obtained to evaluate the vascular anatomy. Carotid stenosis measurements (when applicable) are obtained utilizing NASCET criteria, using the distal internal carotid diameter as the denominator. CONTRAST:  90 mL Isovue 370 COMPARISON:  None. FINDINGS: CT HEAD FINDINGS Brain: There is no evidence of acute infarct, intracranial hemorrhage, mass, midline shift, or extra-axial fluid collection. The ventricles and sulci are normal. Vascular: Minimal calcified atherosclerosis at the skullbase. Skull: No fracture or destructive osseous lesion. Sinuses: Mild mucosal thickening in the maxillary sinuses. Clear mastoid air cells. Orbits: Unremarkable. Review of the MIP images confirms the above findings CTA NECK FINDINGS Aortic arch: Standard 3 vessel aortic arch. Widely patent brachiocephalic and subclavian arteries. Right carotid system: Patent without evidence of stenosis, dissection, or significant atherosclerosis. Left carotid system: Patent without evidence of stenosis or dissection. Mild calcified plaque at the carotid bulb. Vertebral arteries: Patent without evidence of stenosis or dissection. The right vertebral artery is minimally larger than the left.  Small focal ossification  is incidentally noted in the soft tissues adjacent to the left V3 segment at the C1 level, not affecting the vessel. Skeleton: Incidental incomplete fusion of the C1 posterior ring, a normal variant. Moderate disc degeneration at C5-6 and C6-7. Other neck: Asymmetric enlargement and heterogeneity of the right thyroid lobe with coarse calcification. Retained secretions in the pharynx with an endotracheal tube in place, terminating well above the carina. Upper chest: Partially visualized small bilateral pleural effusions and dependent atelectasis. Review of the MIP images confirms the above findings CTA HEAD FINDINGS Anterior circulation: The internal carotid arteries are widely patent from skullbase to carotid termini. ACAs and MCAs are patent with mild branch vessel irregularity but no evidence of proximal branch occlusion or significant stenosis. No aneurysm. Posterior circulation: The intracranial vertebral arteries are widely patent to the basilar. Patent right PICA, bilateral AICA, bilateral SCA origins are identified. The basilar artery is widely patent. There is a small right posterior communicating artery. PCAs are patent without evidence of significant stenosis. No aneurysm. Venous sinuses: As permitted by contrast timing, patent. Anatomic variants: None. Delayed phase: Not performed. Review of the MIP images confirms the above findings IMPRESSION: 1. No emergent large vessel occlusion. 2. Mild intracranial and extracranial atherosclerosis without significant stenosis. 3. No evidence of acute intracranial abnormality on CT. These results were called by telephone at the time of interpretation on 09/05/2017 at 4:12 pm to Dr. Amie Portland , who verbally acknowledged these results. Electronically Signed: By: Logan Bores M.D. On: 09/05/2017 16:13   Dg Chest Port 1 View  Result Date: 09/05/2017 CLINICAL DATA:  Right central line placement EXAM: PORTABLE CHEST 1 VIEW COMPARISON:   09/05/2017 FINDINGS: Endotracheal tube with tip measuring 4.5 cm above the carina. Interval placement of a right central venous catheter. Tip localizes to the right atrium, about 2.3 cm below the expected location of the cavoatrial junction. Consider retracting the catheter 2.5 cm and placement at the cavoatrial junction is desired. Heart size and pulmonary vascularity are normal. Lungs are clear and expanded. No blunting of costophrenic angles. No pneumothorax. Mediastinal contours appear intact. IMPRESSION: Right central venous catheter has been placed. Tip projects over the right atrium about 2.3 cm below the expected location of the cavoatrial junction. Electronically Signed   By: Lucienne Capers M.D.   On: 09/05/2017 21:30   Dg Chest Port 1 View  Result Date: 09/05/2017 CLINICAL DATA:  66 year old female status post intubation EXAM: PORTABLE CHEST 1 VIEW COMPARISON:  Prior chest x-ray earlier today at 5:17 a.m. FINDINGS: Interval intubation. The endotracheal tube is well positioned 4.1 cm above the carina. Stable cardiomegaly. Persistent and slightly increased pulmonary vascular congestion with mild interstitial edema. No pneumothorax or focal airspace consolidation. No acute osseous abnormality. IMPRESSION: 1. The endotracheal tube is well positioned with the tip 4.1 cm above the carina. 2. Mild CHF with cardiomegaly, pulmonary vascular congestion and interstitial edema. Electronically Signed   By: Jacqulynn Cadet M.D.   On: 09/05/2017 09:34   Dg Chest Port 1 View  Result Date: 09/05/2017 CLINICAL DATA:  Increased shortness of breath. History of hypertension. EXAM: PORTABLE CHEST 1 VIEW COMPARISON:  Chest radiograph September 04, 2017 FINDINGS: Cardiac silhouette is upper limits of normal size. Mediastinal silhouette is nonsuspicious. Increasing interstitial prominence without pleural effusion or focal consolidation. No pneumothorax. Soft tissue planes and included osseous structures are nonsuspicious.  IMPRESSION: New interstitial prominence seen with pulmonary edema and atypical infection. No focal consolidation. Electronically Signed   By: Elon Alas  M.D.   On: 09/05/2017 05:43    Medications:  Scheduled: . aspirin EC  81 mg Oral Daily  . atorvastatin  80 mg Oral q1800  . chlorhexidine gluconate (MEDLINE KIT)  15 mL Mouth Rinse BID  . Chlorhexidine Gluconate Cloth  6 each Topical Daily  . heparin  5,000 Units Subcutaneous Q8H  . mouth rinse  15 mL Mouth Rinse QID  . pantoprazole sodium  40 mg Per Tube Daily  . pneumococcal 23 valent vaccine  0.5 mL Intramuscular Tomorrow-1000  . sodium chloride flush  10-40 mL Intracatheter Q12H  . sodium chloride flush  3 mL Intravenous Q12H  . sodium chloride flush  3 mL Intravenous Q12H   Continuous: . sodium chloride    . aztreonam    . aztreonam Stopped (09/06/17 0944)  . fentaNYL infusion INTRAVENOUS Stopped (09/06/17 0758)  . levETIRAcetam Stopped (09/06/17 0929)  . midazolam (VERSED) infusion Stopped (09/06/17 0725)  . norepinephrine (LEVOPHED) Adult infusion 12 mcg/min (09/06/17 0817)  . vancomycin Stopped (09/06/17 0903)   EEG: "Posterior dominant rhythm is 6-7 Hz symmetrical and reactive but intermixed with fast beta activity EEG comprised of generalized beta activity which is medication effect such as benzodiazepine. Focal slowing, Epileptiform features were not seen during this recording Sleep architecture was not observed."  Impression: New onset of convulsions with extensor posturing following cardiac catheterization.  1. Now arousable to a drowsy state and able to follow some commands. No clinical seizure activity noted. No epileptiform activity seen on EEG.  2. Overall exam most consistent with a diffuse encephalopathy. DDx includes extended postictal state, residual effects of sedation/anesthesia, hypoxic/anoxic injury and toxic/metabolic encephalopathy.  3. Of note, seizures can occur during or following general  anesthesia in some patients; this is felt to be a relatively likely etiology for the convulsions seen by Cardiology.   Recommendations: 1. Continue Keppra 500 BID scheduled 2. No ativan unless seizure like activity >2mn. Call neurology also. 3. MRI brain when possible. 4. We will continue to follow with you.    LOS: 2 days   '@Electronically'  signed: Dr. EKerney Elbe9/09/2017  9:39 AM

## 2017-09-06 NOTE — Progress Notes (Signed)
RT note- SBT initiated by CCM, patient is currently on these settings.

## 2017-09-06 NOTE — Progress Notes (Signed)
PULMONARY / CRITICAL CARE MEDICINE   Name: Brenda Lloyd MRN: [id number] DOB: [birth year]    ADMISSION DATE:  09/04/2017 CONSULTATION DATE:  09/05/2017  REFERRING MD:  Virgina OrganQureshi  CHIEF COMPLAINT:  Chest pain and dyspnea  HISTORY OF PRESENT ILLNESS:   This is a 66 year old with no known history of coronary disease, who  presented 9/8 with several days of increasing chest pain and dyspnea. She had a slight bump in her troponin with a max of 3.15, but no definitive ST segment changes. Early 9/9 she developed increasing dyspnea and desaturation and she required emergent intubation. 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 Bedside echo had suggested a EF of approximately 40%, at ventriculography the EF was estimated to be between 25 and 35% with a typical Takosubo pattern of LV dysfunction. She also has 3+ mitral regurgitation. She is returned to the intensive care unit intubated mechanically ventilated and sedated.  SUBJECTIVE:  Not obtainable from the patient at this time. No acute events overnight.  VITAL SIGNS: BP (!) 110/47   Pulse (!) 107   Temp 99 F (37.2 C) (Axillary)   Resp 12   Ht 5\' 9"  (1.753 m)   Wt 90 kg (198 lb 6.6 oz)   SpO2 99%   BMI 29.30 kg/m   HEMODYNAMICS:    VENTILATOR SETTINGS: Vent Mode: PRVC FiO2 (%):  [40 %] 40 % Set Rate:  [12 bmp] 12 bmp Vt Set:  [530 mL] 530 mL PEEP:  [5 cmH20] 5 cmH20 Plateau Pressure:  [17 cmH20-19 cmH20] 18 cmH20  INTAKE / OUTPUT: I/O last 3 completed shifts: In: 2206.8 [I.V.:1891.8; IV Piggyback:315] Out: 2375 [Urine:2375]  PHYSICAL EXAMINATION: General:  Adult female in NAD on vent Neuro: Opens eyes to voice and follows commands. Intermittent agitation.  HEENT:  [place]/AT, PERRL, No JVD Cardiovascular:  RRR, 2/6 SEM Lungs: Clear bilateral breath sounds, diminished. Abdomen:  Soft, non-tender,  non-distended MSK: No acute deformity or ROM limitation Skin: Grossly intact   LABS:  BMET  Recent Labs Lab 09/04/17 1500 09/05/17 0602 09/05/17 1408  NA 140 139 140  K 3.6 3.5 3.8  CL 105 107 105  CO2 25 19*  --   BUN 12 8 10   CREATININE 0.70 0.61 0.60  GLUCOSE 154* 154* 137*    Electrolytes  Recent Labs Lab 09/04/17 1500 09/05/17 0602  CALCIUM 9.7 8.8*    CBC  Recent Labs Lab 09/04/17 1500 09/05/17 1408 09/06/17 0405  WBC 13.6*  --  19.7*  HGB 13.2 11.6* 11.6*  HCT 38.7 34.0* 36.3  PLT 301  --  318    Coag's  Recent Labs Lab 09/04/17 1500 09/05/17 0602  INR 1.04 1.09    Sepsis Markers No results for input(s): LATICACIDVEN, PROCALCITON, O2SATVEN in the last 168 hours.  ABG  Recent Labs Lab 09/05/17 0808 09/05/17 1052 09/05/17 1404  PHART 7.427 7.480* 7.352  PCO2ART 39.9 34.0 47.2  PO2ART 338.0* 108.0 81.0*    Liver Enzymes  Recent Labs Lab 09/04/17 1500 09/05/17 0602  AST 27 38  ALT 33 31  ALKPHOS 82 85  BILITOT 0.8 1.5*  ALBUMIN 4.1 3.8    Cardiac Enzymes  Recent Labs Lab 09/04/17 2105 09/05/17 0202 09/05/17 0602  TROPONINI 2.35* 3.67* 1.88*    Glucose  Recent Labs Lab 09/05/17 0950 09/06/17 0728  GLUCAP 141* 170*    Imaging Ct Angio Head  W Or Wo Contrast  Addendum Date: 09/05/2017   ADDENDUM REPORT: 09/05/2017 16:36 ADDENDUM: Bilateral pleural effusions. Electronically Signed   By: Sebastian Ache M.D.   On: 09/05/2017 16:36   Result Date: 09/05/2017 CLINICAL DATA:  Seizure today. Cardiac catheterization earlier this morning. EXAM: CT ANGIOGRAPHY HEAD AND NECK TECHNIQUE: Multidetector CT imaging of the head and neck was performed using the standard protocol during bolus administration of intravenous contrast. Multiplanar CT image reconstructions and MIPs were obtained to evaluate the vascular anatomy. Carotid stenosis measurements (when applicable) are obtained utilizing NASCET criteria, using the distal internal  carotid diameter as the denominator. CONTRAST:  90 mL Isovue 370 COMPARISON:  None. FINDINGS: CT HEAD FINDINGS Brain: There is no evidence of acute infarct, intracranial hemorrhage, mass, midline shift, or extra-axial fluid collection. The ventricles and sulci are normal. Vascular: Minimal calcified atherosclerosis at the skullbase. Skull: No fracture or destructive osseous lesion. Sinuses: Mild mucosal thickening in the maxillary sinuses. Clear mastoid air cells. Orbits: Unremarkable. Review of the MIP images confirms the above findings CTA NECK FINDINGS Aortic arch: Standard 3 vessel aortic arch. Widely patent brachiocephalic and subclavian arteries. Right carotid system: Patent without evidence of stenosis, dissection, or significant atherosclerosis. Left carotid system: Patent without evidence of stenosis or dissection. Mild calcified plaque at the carotid bulb. Vertebral arteries: Patent without evidence of stenosis or dissection. The right vertebral artery is minimally larger than the left. Small focal ossification is incidentally noted in the soft tissues adjacent to the left V3 segment at the C1 level, not affecting the vessel. Skeleton: Incidental incomplete fusion of the C1 posterior ring, a normal variant. Moderate disc degeneration at C5-6 and C6-7. Other neck: Asymmetric enlargement and heterogeneity of the right thyroid lobe with coarse calcification. Retained secretions in the pharynx with an endotracheal tube in place, terminating well above the carina. Upper chest: Partially visualized small bilateral pleural effusions and dependent atelectasis. Review of the MIP images confirms the above findings CTA HEAD FINDINGS Anterior circulation: The internal carotid arteries are widely patent from skullbase to carotid termini. ACAs and MCAs are patent with mild branch vessel irregularity but no evidence of proximal branch occlusion or significant stenosis. No aneurysm. Posterior circulation: The intracranial  vertebral arteries are widely patent to the basilar. Patent right PICA, bilateral AICA, bilateral SCA origins are identified. The basilar artery is widely patent. There is a small right posterior communicating artery. PCAs are patent without evidence of significant stenosis. No aneurysm. Venous sinuses: As permitted by contrast timing, patent. Anatomic variants: None. Delayed phase: Not performed. Review of the MIP images confirms the above findings IMPRESSION: 1. No emergent large vessel occlusion. 2. Mild intracranial and extracranial atherosclerosis without significant stenosis. 3. No evidence of acute intracranial abnormality on CT. These results were called by telephone at the time of interpretation on 09/05/2017 at 4:12 pm to Dr. Milon Dikes , who verbally acknowledged these results. Electronically Signed: By: Sebastian Ache M.D. On: 09/05/2017 16:13   Ct Angio Neck W Or Wo Contrast  Addendum Date: 09/05/2017   ADDENDUM REPORT: 09/05/2017 16:36 ADDENDUM: Bilateral pleural effusions. Electronically Signed   By: Sebastian Ache M.D.   On: 09/05/2017 16:36   Result Date: 09/05/2017 CLINICAL DATA:  Seizure today. Cardiac catheterization earlier this morning. EXAM: CT ANGIOGRAPHY HEAD AND NECK TECHNIQUE: Multidetector CT imaging of the head and neck was performed using the standard protocol during bolus administration of intravenous contrast. Multiplanar CT image reconstructions and MIPs were obtained to evaluate the vascular anatomy.  Carotid stenosis measurements (when applicable) are obtained utilizing NASCET criteria, using the distal internal carotid diameter as the denominator. CONTRAST:  90 mL Isovue 370 COMPARISON:  None. FINDINGS: CT HEAD FINDINGS Brain: There is no evidence of acute infarct, intracranial hemorrhage, mass, midline shift, or extra-axial fluid collection. The ventricles and sulci are normal. Vascular: Minimal calcified atherosclerosis at the skullbase. Skull: No fracture or destructive osseous  lesion. Sinuses: Mild mucosal thickening in the maxillary sinuses. Clear mastoid air cells. Orbits: Unremarkable. Review of the MIP images confirms the above findings CTA NECK FINDINGS Aortic arch: Standard 3 vessel aortic arch. Widely patent brachiocephalic and subclavian arteries. Right carotid system: Patent without evidence of stenosis, dissection, or significant atherosclerosis. Left carotid system: Patent without evidence of stenosis or dissection. Mild calcified plaque at the carotid bulb. Vertebral arteries: Patent without evidence of stenosis or dissection. The right vertebral artery is minimally larger than the left. Small focal ossification is incidentally noted in the soft tissues adjacent to the left V3 segment at the C1 level, not affecting the vessel. Skeleton: Incidental incomplete fusion of the C1 posterior ring, a normal variant. Moderate disc degeneration at C5-6 and C6-7. Other neck: Asymmetric enlargement and heterogeneity of the right thyroid lobe with coarse calcification. Retained secretions in the pharynx with an endotracheal tube in place, terminating well above the carina. Upper chest: Partially visualized small bilateral pleural effusions and dependent atelectasis. Review of the MIP images confirms the above findings CTA HEAD FINDINGS Anterior circulation: The internal carotid arteries are widely patent from skullbase to carotid termini. ACAs and MCAs are patent with mild branch vessel irregularity but no evidence of proximal branch occlusion or significant stenosis. No aneurysm. Posterior circulation: The intracranial vertebral arteries are widely patent to the basilar. Patent right PICA, bilateral AICA, bilateral SCA origins are identified. The basilar artery is widely patent. There is a small right posterior communicating artery. PCAs are patent without evidence of significant stenosis. No aneurysm. Venous sinuses: As permitted by contrast timing, patent. Anatomic variants: None.  Delayed phase: Not performed. Review of the MIP images confirms the above findings IMPRESSION: 1. No emergent large vessel occlusion. 2. Mild intracranial and extracranial atherosclerosis without significant stenosis. 3. No evidence of acute intracranial abnormality on CT. These results were called by telephone at the time of interpretation on 09/05/2017 at 4:12 pm to Dr. Milon Dikes , who verbally acknowledged these results. Electronically Signed: By: Sebastian Ache M.D. On: 09/05/2017 16:13   Dg Chest Port 1 View  Result Date: 09/05/2017 CLINICAL DATA:  Right central line placement EXAM: PORTABLE CHEST 1 VIEW COMPARISON:  09/05/2017 FINDINGS: Endotracheal tube with tip measuring 4.5 cm above the carina. Interval placement of a right central venous catheter. Tip localizes to the right atrium, about 2.3 cm below the expected location of the cavoatrial junction. Consider retracting the catheter 2.5 cm and placement at the cavoatrial junction is desired. Heart size and pulmonary vascularity are normal. Lungs are clear and expanded. No blunting of costophrenic angles. No pneumothorax. Mediastinal contours appear intact. IMPRESSION: Right central venous catheter has been placed. Tip projects over the right atrium about 2.3 cm below the expected location of the cavoatrial junction. Electronically Signed   By: Burman Nieves M.D.   On: 09/05/2017 21:30   Dg Chest Port 1 View  Result Date: 09/05/2017 CLINICAL DATA:  66 year old female status post intubation EXAM: PORTABLE CHEST 1 VIEW COMPARISON:  Prior chest x-ray earlier today at 5:17 a.m. FINDINGS: Interval intubation. The endotracheal tube is  well positioned 4.1 cm above the carina. Stable cardiomegaly. Persistent and slightly increased pulmonary vascular congestion with mild interstitial edema. No pneumothorax or focal airspace consolidation. No acute osseous abnormality. IMPRESSION: 1. The endotracheal tube is well positioned with the tip 4.1 cm above the  carina. 2. Mild CHF with cardiomegaly, pulmonary vascular congestion and interstitial edema. Electronically Signed   By: Malachy Moan M.D.   On: 09/05/2017 09:34     STUDIES:  P chest x-ray shows a well-placed endotracheal tube and very slight vascular engorgement.    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 Decompensated 9/9 with pulm edema requiring intubation and aggressive diuresis.   ASSESSMENT / PLAN:  PULMONARY A: Acute hypoxemic respiratory failure secondary to pulmonary edema.   P:   Full vent support SBT this AM CXR while intubated VAP bundle Hopeful for extubation today.   CARDIOVASCULAR A:  Acute CHF in the setting of NSTEMI, Takotsubo  CAD non obstructive Shock, sedation related vs cardiogenic H/o HTN  P:  Telemetry monitoring Cardiology primary Diuresis per primary Norepinephrine for MAP > 65 mmHg (currently 12 mcg) Continue ASA home  RENAL A:   No acute issues  P:   Repeat BMP this AM I&O Diuresis per primary  GASTROINTESTINAL A:   No acute issues  P:   NPO PPI If unable to extubate today will start TF  HEMATOLOGIC A:   Anemia  P:  Follow CBC SQ heparin for VTE ppx   INFECTIOUS A:   Possible aspiration  P:   Continue aztreonam/vanco. Can likely deescalate once trach asp culture back. WBC trending up, continue to trend.   ENDOCRINE A:   Hyperglycemia with no history of DM  P:   Follow glucose on chemistry Add SSI if indicated.   NEUROLOGIC A:   Acute metabolic encephalopathy (improving)  P:   RASS goal: 0 PRn fentanyl, versed   FAMILY  - Updates: Husband updated bedside 9/10  - Inter-disciplinary family meet or Palliative Care meeting due by:  9/15   Joneen Roach, AGACNP-BC [place] Pager [phone] or [phone]  09/06/2017 9:31  AM

## 2017-09-06 NOTE — Progress Notes (Signed)
Pt has not spontaneously voided since removal of foley catheter. Patient tried to void in bed pan, but was unsuccessful. Performed bladder scan which showed 237cc in the bladder. Patient does not feel the need to void and says that she will try to void in the next two hours. Will continue to monitor patient.  Horton ChinMacKayla A Randell Detter, RN

## 2017-09-07 DIAGNOSIS — R9431 Abnormal electrocardiogram [ECG] [EKG]: Secondary | ICD-10-CM

## 2017-09-07 DIAGNOSIS — E041 Nontoxic single thyroid nodule: Secondary | ICD-10-CM

## 2017-09-07 LAB — CBC WITH DIFFERENTIAL/PLATELET
Basophils Absolute: 0 10*3/uL (ref 0.0–0.1)
Basophils Relative: 0 %
EOS PCT: 0 %
Eosinophils Absolute: 0 10*3/uL (ref 0.0–0.7)
HCT: 34.2 % — ABNORMAL LOW (ref 36.0–46.0)
HEMOGLOBIN: 11.2 g/dL — AB (ref 12.0–15.0)
Lymphocytes Relative: 16 %
Lymphs Abs: 2.4 10*3/uL (ref 0.7–4.0)
MCH: 28.6 pg (ref 26.0–34.0)
MCHC: 32.7 g/dL (ref 30.0–36.0)
MCV: 87.5 fL (ref 78.0–100.0)
MONO ABS: 1.1 10*3/uL — AB (ref 0.1–1.0)
MONOS PCT: 7 %
NEUTROS PCT: 77 %
Neutro Abs: 11.5 10*3/uL — ABNORMAL HIGH (ref 1.7–7.7)
Platelets: 202 10*3/uL (ref 150–400)
RBC: 3.91 MIL/uL (ref 3.87–5.11)
RDW: 14.1 % (ref 11.5–15.5)
WBC: 15 10*3/uL — AB (ref 4.0–10.5)

## 2017-09-07 LAB — GLUCOSE, CAPILLARY: GLUCOSE-CAPILLARY: 113 mg/dL — AB (ref 65–99)

## 2017-09-07 LAB — RENAL FUNCTION PANEL
ANION GAP: 9 (ref 5–15)
Albumin: 2.8 g/dL — ABNORMAL LOW (ref 3.5–5.0)
BUN: 9 mg/dL (ref 6–20)
CHLORIDE: 103 mmol/L (ref 101–111)
CO2: 23 mmol/L (ref 22–32)
Calcium: 8.5 mg/dL — ABNORMAL LOW (ref 8.9–10.3)
Creatinine, Ser: 0.55 mg/dL (ref 0.44–1.00)
GFR calc non Af Amer: >60 mL/min (ref >60)
Glucose, Bld: 113 mg/dL — ABNORMAL HIGH (ref 65–99)
POTASSIUM: 3.5 mmol/L (ref 3.5–5.1)
Phosphorus: 1.2 mg/dL — ABNORMAL LOW (ref 2.5–4.6)
Sodium: 135 mmol/L (ref 135–145)

## 2017-09-07 LAB — MAGNESIUM: Magnesium: 1.9 mg/dL (ref 1.7–2.4)

## 2017-09-07 MED ORDER — POTASSIUM PHOSPHATES 15 MMOLE/5ML IV SOLN
30.0000 mmol | Freq: Once | INTRAVENOUS | Status: AC
  Administered 2017-09-07: 30 mmol via INTRAVENOUS
  Filled 2017-09-07: qty 10

## 2017-09-07 MED ORDER — ONDANSETRON HCL 4 MG/2ML IJ SOLN
4.0000 mg | Freq: Three times a day (TID) | INTRAMUSCULAR | Status: DC | PRN
  Administered 2017-09-07 – 2017-09-09 (×2): 4 mg via INTRAVENOUS
  Filled 2017-09-07 (×2): qty 2

## 2017-09-07 NOTE — Progress Notes (Deleted)
Called Elink with Lactic Acid results of 5.7 which are trending down. Also verified that patients body core temp was okay with physician.  Will continue to monitor patient.  Horton ChinMacKayla A Emilija Bohman, RN

## 2017-09-07 NOTE — Progress Notes (Signed)
Progress Note  Patient Name: Brenda Lloyd Date of Encounter: 09/07/2017  Primary Cardiologist: New  Subjective   Complains of feeling tired, but denies chest pain or dyspnea. She is fully alert, fully oriented and even smiling at times. She does have a rather depressed mood and states that she would be ready to die. She experiences a lot of sadness in her life. Telemetry shows decrease frequency of PVCs. ECG shows improvement in the anterior repolarization changes and QT now shorter at only 495 ms , yesterday 526 ms)   Inpatient Medications    Scheduled Meds: . aspirin EC  81 mg Oral Daily  . atorvastatin  80 mg Oral q1800  . chlorhexidine gluconate (MEDLINE KIT)  15 mL Mouth Rinse BID  . Chlorhexidine Gluconate Cloth  6 each Topical Daily  . heparin  5,000 Units Subcutaneous Q8H  . mouth rinse  15 mL Mouth Rinse QID  . pneumococcal 23 valent vaccine  0.5 mL Intramuscular Tomorrow-1000  . sodium chloride flush  10-40 mL Intracatheter Q12H  . sodium chloride flush  3 mL Intravenous Q12H  . sodium chloride flush  3 mL Intravenous Q12H   Continuous Infusions: . sodium chloride 250 mL (09/07/17 0600)  . aztreonam Stopped (09/07/17 1017)  . levETIRAcetam Stopped (09/07/17 1025)  . norepinephrine (LEVOPHED) Adult infusion Stopped (09/06/17 1110)  . vancomycin 1,000 mg (09/07/17 1139)   PRN Meds: sodium chloride, acetaminophen, ipratropium-albuterol, nitroGLYCERIN, sodium chloride flush, sodium chloride flush   Vital Signs    Vitals:   09/07/17 0900 09/07/17 1000 09/07/17 1100 09/07/17 1137  BP: (!) 108/59 (!) 102/56 106/63   Pulse: 87 83 87   Resp: (!) 21 (!) 25 (!) 23   Temp:    98.9 F (37.2 C)  TempSrc:    Oral  SpO2: 100% 95% 100%   Weight:      Height:        Intake/Output Summary (Last 24 hours) at 09/07/17 1208 Last data filed at 09/07/17 0600  Gross per 24 hour  Intake              755 ml  Output              525 ml  Net              230 ml    Filed Weights   09/04/17 2028 09/06/17 0500 09/07/17 0500  Weight: 184 lb 4.8 oz (83.6 kg) 198 lb 6.6 oz (90 kg) 195 lb 15.8 oz (88.9 kg)    Telemetry    Normal sinus rhythm, occasional PVCs - Personally Reviewed  ECG    sinus rhythm, improved anterior T-wave inversion, QTC 495 ms remains prolonged, but improved since yesterday - Personally Reviewed  Physical Exam  Alert, oriented, calm GEN: No acute distress.   Neck: No JVD Cardiac: RRR, 3/6 holosystolic apical murmur, no diastolic murmurs, rubs, or gallops.  Respiratory: Clear to auscultation bilaterally. GI: Soft, nontender, non-distended  MS: No edema; No deformity. Neuro:  Nonfocal  Psych: Normal affect   Labs    Chemistry Recent Labs Lab 09/04/17 1500 09/05/17 0602 09/05/17 1408 09/06/17 1748 09/07/17 0406  NA 140 139 140 135 135  K 3.6 3.5 3.8 3.4* 3.5  CL 105 107 105 103 103  CO2 25 19*  --  24 23  GLUCOSE 154* 154* 137* 184* 113*  BUN _0 CREATININE 0.70 0.61 0.60 0.69 0.55  CALCIUM 9.7 8.8*  --  8.1* 8.5*  PROT 7.7 7.0  --   --   --   ALBUMIN 4.1 3.8  --   --  2.8*  AST 27 38  --   --   --   ALT 33 31  --   --   --   ALKPHOS 82 85  --   --   --   BILITOT 0.8 1.5*  --   --   --   GFRNONAA >60 >60  --  >60 >60  GFRAA >60 >60  --  >60 >60  ANIONGAP 10 13  --  8 9     Hematology Recent Labs Lab 09/04/17 1500 09/05/17 1408 09/06/17 0405 09/07/17 0406  WBC 13.6*  --  19.7* 15.0*  RBC 4.57  --  4.17 3.91  HGB 13.2 11.6* 11.6* 11.2*  HCT 38.7 34.0* 36.3 34.2*  MCV 84.7  --  87.1 87.5  MCH 28.9  --  27.8 28.6  MCHC 34.1  --  32.0 32.7  RDW 13.6  --  14.0 14.1  PLT 301  --  318 202    Cardiac Enzymes Recent Labs Lab 09/04/17 1857 09/04/17 2105 09/05/17 0202 09/05/17 0602  TROPONINI 3.15* 2.35* 3.67* 1.88*   No results for input(s): TROPIPOC in the last 168 hours.   BNP Recent Labs Lab 09/04/17 1500 09/04/17 2105  BNP 43.2 147.3*     DDimer  Recent Labs Lab  09/04/17 1500  DDIMER <0.27     Radiology    Ct Angio Head W Or Wo Contrast  Addendum Date: 09/05/2017   ADDENDUM REPORT: 09/05/2017 16:36 ADDENDUM: Bilateral pleural effusions. Electronically Signed   By: Logan Bores M.D.   On: 09/05/2017 16:36   Result Date: 09/05/2017 CLINICAL DATA:  Seizure today. Cardiac catheterization earlier this morning. EXAM: CT ANGIOGRAPHY HEAD AND NECK TECHNIQUE: Multidetector CT imaging of the head and neck was performed using the standard protocol during bolus administration of intravenous contrast. Multiplanar CT image reconstructions and MIPs were obtained to evaluate the vascular anatomy. Carotid stenosis measurements (when applicable) are obtained utilizing NASCET criteria, using the distal internal carotid diameter as the denominator. CONTRAST:  90 mL Isovue 370 COMPARISON:  None. FINDINGS: CT HEAD FINDINGS Brain: There is no evidence of acute infarct, intracranial hemorrhage, mass, midline shift, or extra-axial fluid collection. The ventricles and sulci are normal. Vascular: Minimal calcified atherosclerosis at the skullbase. Skull: No fracture or destructive osseous lesion. Sinuses: Mild mucosal thickening in the maxillary sinuses. Clear mastoid air cells. Orbits: Unremarkable. Review of the MIP images confirms the above findings CTA NECK FINDINGS Aortic arch: Standard 3 vessel aortic arch. Widely patent brachiocephalic and subclavian arteries. Right carotid system: Patent without evidence of stenosis, dissection, or significant atherosclerosis. Left carotid system: Patent without evidence of stenosis or dissection. Mild calcified plaque at the carotid bulb. Vertebral arteries: Patent without evidence of stenosis or dissection. The right vertebral artery is minimally larger than the left. Small focal ossification is incidentally noted in the soft tissues adjacent to the left V3 segment at the C1 level, not affecting the vessel. Skeleton: Incidental incomplete fusion of  the C1 posterior ring, a normal variant. Moderate disc degeneration at C5-6 and C6-7. Other neck: Asymmetric enlargement and heterogeneity of the right thyroid lobe with coarse calcification. Retained secretions in the pharynx with an endotracheal tube in place, terminating well above the carina. Upper chest: Partially visualized small bilateral pleural effusions and dependent atelectasis. Review of the MIP images confirms the above findings  CTA HEAD FINDINGS Anterior circulation: The internal carotid arteries are widely patent from skullbase to carotid termini. ACAs and MCAs are patent with mild branch vessel irregularity but no evidence of proximal branch occlusion or significant stenosis. No aneurysm. Posterior circulation: The intracranial vertebral arteries are widely patent to the basilar. Patent right PICA, bilateral AICA, bilateral SCA origins are identified. The basilar artery is widely patent. There is a small right posterior communicating artery. PCAs are patent without evidence of significant stenosis. No aneurysm. Venous sinuses: As permitted by contrast timing, patent. Anatomic variants: None. Delayed phase: Not performed. Review of the MIP images confirms the above findings IMPRESSION: 1. No emergent large vessel occlusion. 2. Mild intracranial and extracranial atherosclerosis without significant stenosis. 3. No evidence of acute intracranial abnormality on CT. These results were called by telephone at the time of interpretation on 09/05/2017 at 4:12 pm to Dr. Amie Portland , who verbally acknowledged these results. Electronically Signed: By: Logan Bores M.D. On: 09/05/2017 16:13   Ct Angio Neck W Or Wo Contrast  Addendum Date: 09/05/2017   ADDENDUM REPORT: 09/05/2017 16:36 ADDENDUM: Bilateral pleural effusions. Electronically Signed   By: Logan Bores M.D.   On: 09/05/2017 16:36   Result Date: 09/05/2017 CLINICAL DATA:  Seizure today. Cardiac catheterization earlier this morning. EXAM: CT  ANGIOGRAPHY HEAD AND NECK TECHNIQUE: Multidetector CT imaging of the head and neck was performed using the standard protocol during bolus administration of intravenous contrast. Multiplanar CT image reconstructions and MIPs were obtained to evaluate the vascular anatomy. Carotid stenosis measurements (when applicable) are obtained utilizing NASCET criteria, using the distal internal carotid diameter as the denominator. CONTRAST:  90 mL Isovue 370 COMPARISON:  None. FINDINGS: CT HEAD FINDINGS Brain: There is no evidence of acute infarct, intracranial hemorrhage, mass, midline shift, or extra-axial fluid collection. The ventricles and sulci are normal. Vascular: Minimal calcified atherosclerosis at the skullbase. Skull: No fracture or destructive osseous lesion. Sinuses: Mild mucosal thickening in the maxillary sinuses. Clear mastoid air cells. Orbits: Unremarkable. Review of the MIP images confirms the above findings CTA NECK FINDINGS Aortic arch: Standard 3 vessel aortic arch. Widely patent brachiocephalic and subclavian arteries. Right carotid system: Patent without evidence of stenosis, dissection, or significant atherosclerosis. Left carotid system: Patent without evidence of stenosis or dissection. Mild calcified plaque at the carotid bulb. Vertebral arteries: Patent without evidence of stenosis or dissection. The right vertebral artery is minimally larger than the left. Small focal ossification is incidentally noted in the soft tissues adjacent to the left V3 segment at the C1 level, not affecting the vessel. Skeleton: Incidental incomplete fusion of the C1 posterior ring, a normal variant. Moderate disc degeneration at C5-6 and C6-7. Other neck: Asymmetric enlargement and heterogeneity of the right thyroid lobe with coarse calcification. Retained secretions in the pharynx with an endotracheal tube in place, terminating well above the carina. Upper chest: Partially visualized small bilateral pleural effusions  and dependent atelectasis. Review of the MIP images confirms the above findings CTA HEAD FINDINGS Anterior circulation: The internal carotid arteries are widely patent from skullbase to carotid termini. ACAs and MCAs are patent with mild branch vessel irregularity but no evidence of proximal branch occlusion or significant stenosis. No aneurysm. Posterior circulation: The intracranial vertebral arteries are widely patent to the basilar. Patent right PICA, bilateral AICA, bilateral SCA origins are identified. The basilar artery is widely patent. There is a small right posterior communicating artery. PCAs are patent without evidence of significant stenosis. No aneurysm. Venous sinuses:  As permitted by contrast timing, patent. Anatomic variants: None. Delayed phase: Not performed. Review of the MIP images confirms the above findings IMPRESSION: 1. No emergent large vessel occlusion. 2. Mild intracranial and extracranial atherosclerosis without significant stenosis. 3. No evidence of acute intracranial abnormality on CT. These results were called by telephone at the time of interpretation on 09/05/2017 at 4:12 pm to Dr. Amie Portland , who verbally acknowledged these results. Electronically Signed: By: Logan Bores M.D. On: 09/05/2017 16:13   Dg Chest Port 1 View  Result Date: 09/05/2017 CLINICAL DATA:  Right central line placement EXAM: PORTABLE CHEST 1 VIEW COMPARISON:  09/05/2017 FINDINGS: Endotracheal tube with tip measuring 4.5 cm above the carina. Interval placement of a right central venous catheter. Tip localizes to the right atrium, about 2.3 cm below the expected location of the cavoatrial junction. Consider retracting the catheter 2.5 cm and placement at the cavoatrial junction is desired. Heart size and pulmonary vascularity are normal. Lungs are clear and expanded. No blunting of costophrenic angles. No pneumothorax. Mediastinal contours appear intact. IMPRESSION: Right central venous catheter has been  placed. Tip projects over the right atrium about 2.3 cm below the expected location of the cavoatrial junction. Electronically Signed   By: Lucienne Capers M.D.   On: 09/05/2017 21:30    Cardiac Studies   Cardiac cath 09/05/2017  Dist LAD lesion, 65 %stenosed. Diffuse tapering from the mid vessel distally around the apex. The vessel has the appearance of potentially diffuse intramural thrombus.  There is moderate to severe left ventricular systolic dysfunction. The left ventricular ejection fraction is 25-35% by visual estimate. - In a Takotsubo pattern  LV end diastolic pressure is moderately elevated. 22 mmHg  There is moderate (3+) mitral regurgitation.  There is no aortic valve stenosis.  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 There is pruning of the septal branches and area that should be covered with with a diagonal branch is empty.  Patient Profile     66 y.o. 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 Minor increase in cardiac troponin I to peak of 3.6.  Assessment & Plan    1. Systolic heart failure, acute: hemodynamically well compensated, off inotropes, suspect LVEF is already significantly improved. Not sure that she needs diuretics. May allow spontaneous diuresis as heart function gets better. Asked her to telemetry 2. 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 Numerous emotional/social stressors at home. She was performing heavy physical exertion just before the admission pushing wheelchairs with fully grown adults with  physical/mental disabilities during event outside in the heat. 3. Mitral regurgitation: The murmur is quite prominent and holosystolic. Will need to reevaluate her mitral valve on the echo later this week. It is also possible that she had acute systolic heart failure due to acute mitral insufficiency following chordal rupture, but her clinical progress so far suggests stress cardio myopathy is more likely cause. 4. 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 Thyroid nodule:  Will need repeat thyroid function tests and endocrinology evaluation after recovery from the acute illness.  For questions or updates, please contact [place] Please consult [email or web address] for contact info under Cardiology/STEMI.      Signed, Sanda Klein, MD  09/07/2017, 12:08 PM

## 2017-09-07 NOTE — Progress Notes (Signed)
Transferred patient to [phone] via wheelchair with Debbie NT and patients son. [facility] Charity fundraiserN. Patient VSS. Left patient on 6L [employer] and RN was at bedside.   Horton ChinMacKayla A Tamiyah Moulin, RN

## 2017-09-07 NOTE — Progress Notes (Addendum)
PULMONARY / CRITICAL CARE MEDICINE   Name: Brenda Lloyd MRN: [id number] DOB: Mar 17, 1951    ADMISSION DATE:  09/04/2017 CONSULTATION DATE:  09/05/2017  REFERRING MD:  Virgina Organ  CHIEF COMPLAINT:  Chest pain and dyspnea  HISTORY OF PRESENT ILLNESS:   This is a 66 year old with no known history of coronary disease, who  presented 9/8 with several days of increasing chest pain and dyspnea. She had a slight bump in her troponin with a max of 3.15, but no definitive ST segment changes. Early 9/9 she developed increasing dyspnea and desaturation and she required emergent intubation. 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 Bedside echo had suggested a EF of approximately 40%, at ventriculography the EF was estimated to be between 25 and 35% with a typical Takosubo pattern of LV dysfunction. She also has 3+ mitral regurgitation. She is returned to the intensive care unit intubated mechanically ventilated and sedated.  SUBJECTIVE:  Feeling much better today, breathing comfortably without pain.   VITAL SIGNS: BP (!) 108/59   Pulse 87   Temp 98.8 F (37.1 C) (Oral)   Resp (!) 21   Ht  (1.753 m)   Wt 88.9 kg (195 lb 15.8 oz)   SpO2 100%   BMI 28.94 kg/m   HEMODYNAMICS:    VENTILATOR SETTINGS: FiO2 (%):  [40 %] 40 %  INTAKE / OUTPUT: I/O last 3 completed shifts: In: 1896.7 [I.V.:1031.7; IV Piggyback:865] Out: 825 [Urine:825]  PHYSICAL EXAMINATION: General:  Adult female in NAD seated in chair Neuro: Alert, oriented, non-focal HEENT:  [place]/AT, PERRL, No appreciable JVD Cardiovascular:  RRR, 2/6 SEM Lungs: Clear bilateral breath sounds, diminished. Abdomen:  Soft, non-tender, non-distended MSK: No acute deformity or ROM limitation Skin: Grossly intact   LABS:  BMET  Recent Labs Lab 09/05/17 0602 09/05/17 1408 09/06/17 1748 09/07/17 0406   NA 139 140 135 135  K 3.5 3.8 3.4* 3.5  CL 107 105 103 103  CO2 19*  --  24 23  BUN CREATININE 0.61 0.60 0.69 0.55  GLUCOSE 154* 137* 184* 113*    Electrolytes  Recent Labs Lab 09/05/17 0602 09/06/17 1748 09/07/17 0406  CALCIUM 8.8* 8.1* 8.5*  MG  --   --  1.9  PHOS  --   --  1.2*    CBC  Recent Labs Lab 09/04/17 1500 09/05/17 1408 09/06/17 0405 09/07/17 0406  WBC 13.6*  --  19.7* 15.0*  HGB 13.2 11.6* 11.6* 11.2*  HCT 38.7 34.0* 36.3 34.2*  PLT 301  --  318 202    Coag's  Recent Labs Lab 09/04/17 1500 09/05/17 0602  INR 1.04 1.09    Sepsis Markers No results for input(s): LATICACIDVEN, PROCALCITON, O2SATVEN in the last 168 hours.  ABG  Recent Labs Lab 09/05/17 0808 09/05/17 1052 09/05/17 1404  PHART 7.427 7.480* 7.352  PCO2ART 39.9 34.0 47.2  PO2ART 338.0* 108.0 81.0*    Liver Enzymes  Recent Labs Lab 09/04/17 1500 09/05/17 0602 09/07/17 0406  AST 27 38  --   ALT 33 31  --   ALKPHOS 82 85  --   BILITOT 0.8 1.5*  --   ALBUMIN 4.1 3.8 2.8*    Cardiac Enzymes  Recent Labs Lab 09/04/17 2105 09/05/17 0202 09/05/17 0602  TROPONINI 2.35* 3.67* 1.88*    Glucose  Recent Labs Lab 09/05/17 0950 09/06/17  [id number] 09/07/17 0743  GLUCAP 141* 170* 113*    Imaging No results found.   STUDIES:  CTA HEAD/NECK 9/9: IMPRESSION: 1. No emergent large vessel occlusion. 2. Mild intracranial and extracranial atherosclerosis without significant stenosis. 3. No evidence of acute intracranial abnormality on CT. LHC 9/9:  Dist LAD lesion, 65 %stenosed. Diffuse tapering from the mid vessel distally around the apex. The vessel has the appearance of potentially diffuse intramural thrombus.  There is moderate to severe left ventricular systolic dysfunction. The left ventricular ejection fraction is 25-35% by visual estimate. - In a Takotsubo pattern  LV end diastolic pressure is moderately elevated. 22 mmHg  There is moderate  (3+) mitral regurgitation.  There is no aortic valve stenosis. PORT CXR 9/9:  Personally reviewed by me. No focal opacity. Endotracheal tube in good position. Right internal jugular central venous catheter with tip in right atrium.  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 Decompensated 9/9 with pulm edema requiring intubation and aggressive diuresis. Responded well to this and was able to be extubated the following day.   Suspected Takotsubo cardiomyopathy - management per primary team  Acute hypoxic respiratory failure - extubated 9/10 and tol well.  - Improved with diuresis, mechanical vent  Possible tracheobronchitis: sputum culture pending, so far moderate s. Aureus > susceptibilities pending.  - Continue to follow cultures - continue empiric aztreonam and vanco given organism. Plan to treat 7 total days ABX - narrow as indicated  PCCM will sign off.   Joneen RoachPaul Cherylann Hobday, AGACNP-BC [facility]/Critical [facility] Pager [phone] or [phone]  09/07/2017 10:38 AM

## 2017-09-08 ENCOUNTER — Inpatient Hospital Stay (HOSPITAL_COMMUNITY): Payer: Medicare Other

## 2017-09-08 DIAGNOSIS — I340 Nonrheumatic mitral (valve) insufficiency: Secondary | ICD-10-CM

## 2017-09-08 LAB — RENAL FUNCTION PANEL
ALBUMIN: 2.7 g/dL — AB (ref 3.5–5.0)
ANION GAP: 4 — AB (ref 5–15)
BUN: 6 mg/dL (ref 6–20)
CO2: 29 mmol/L (ref 22–32)
Calcium: 8.2 mg/dL — ABNORMAL LOW (ref 8.9–10.3)
Chloride: 106 mmol/L (ref 101–111)
Creatinine, Ser: 0.53 mg/dL (ref 0.44–1.00)
GFR calc Af Amer: >60 mL/min (ref >60)
GFR calc non Af Amer: >60 mL/min (ref >60)
GLUCOSE: 111 mg/dL — AB (ref 65–99)
PHOSPHORUS: 2.3 mg/dL — AB (ref 2.5–4.6)
POTASSIUM: 3.2 mmol/L — AB (ref 3.5–5.1)
Sodium: 139 mmol/L (ref 135–145)

## 2017-09-08 LAB — ECHOCARDIOGRAM LIMITED
CHL CUP MV DEC (S): 134
EWDT: 134 ms
FS: 31 % (ref 28–44)
HEIGHTINCHES: 69 in
IVS/LV PW RATIO, ED: 0.97
LA ID, A-P, ES: 37 mm
LA diam end sys: 37 mm
LADIAMINDEX: 1.81 cm/m2
LDCA: 3.46 cm2
LVOT diameter: 21 mm
MV pk A vel: 70.3 m/s
MV pk E vel: 114 m/s
MVPG: 5 mmHg
PW: 14.6 mm — AB (ref 0.6–1.1)
RV sys press: 23 mmHg
Reg peak vel: 223 cm/s
TR max vel: 223 cm/s
WEIGHTICAEL: 3123.2 [oz_av]

## 2017-09-08 LAB — CBC WITH DIFFERENTIAL/PLATELET
BASOS ABS: 0 10*3/uL (ref 0.0–0.1)
Basophils Relative: 0 %
Eosinophils Absolute: 0.1 10*3/uL (ref 0.0–0.7)
Eosinophils Relative: 0 %
HEMATOCRIT: 31.6 % — AB (ref 36.0–46.0)
Hemoglobin: 10.1 g/dL — ABNORMAL LOW (ref 12.0–15.0)
LYMPHS ABS: 1.8 10*3/uL (ref 0.7–4.0)
LYMPHS PCT: 16 %
MCH: 27.8 pg (ref 26.0–34.0)
MCHC: 32 g/dL (ref 30.0–36.0)
MCV: 87.1 fL (ref 78.0–100.0)
MONOS PCT: 8 %
Monocytes Absolute: 0.9 10*3/uL (ref 0.1–1.0)
NEUTROS ABS: 8.8 10*3/uL — AB (ref 1.7–7.7)
Neutrophils Relative %: 76 %
Platelets: 216 10*3/uL (ref 150–400)
RBC: 3.63 MIL/uL — ABNORMAL LOW (ref 3.87–5.11)
RDW: 13.7 % (ref 11.5–15.5)
WBC: 11.6 10*3/uL — ABNORMAL HIGH (ref 4.0–10.5)

## 2017-09-08 LAB — VANCOMYCIN, TROUGH: Vancomycin Tr: 8 ug/mL — ABNORMAL LOW (ref 15–20)

## 2017-09-08 LAB — MAGNESIUM: Magnesium: 2 mg/dL (ref 1.7–2.4)

## 2017-09-08 LAB — GLUCOSE, CAPILLARY: GLUCOSE-CAPILLARY: 101 mg/dL — AB (ref 65–99)

## 2017-09-08 MED ORDER — PERFLUTREN LIPID MICROSPHERE
1.0000 mL | INTRAVENOUS | Status: AC | PRN
  Administered 2017-09-08: 2 mL via INTRAVENOUS
  Filled 2017-09-08: qty 10

## 2017-09-08 MED ORDER — POTASSIUM CHLORIDE CRYS ER 20 MEQ PO TBCR
20.0000 meq | EXTENDED_RELEASE_TABLET | Freq: Two times a day (BID) | ORAL | Status: DC
  Administered 2017-09-08 – 2017-09-10 (×5): 20 meq via ORAL
  Filled 2017-09-08 (×5): qty 1

## 2017-09-08 MED ORDER — VANCOMYCIN HCL IN DEXTROSE 1-5 GM/200ML-% IV SOLN
1000.0000 mg | Freq: Three times a day (TID) | INTRAVENOUS | Status: DC
  Administered 2017-09-08 – 2017-09-09 (×2): 1000 mg via INTRAVENOUS
  Filled 2017-09-08 (×3): qty 200

## 2017-09-08 NOTE — Progress Notes (Signed)
Pharmacy Antibiotic Note Brenda Lloyd is a 66 y.o. female staph aureus tracheobronchitis. Pharmacy following for vancomycin dosing. Currently on day 3 of 7 of planned treatment.   Vancomycin trough of 8 this evening is below desired range of 15-20. SCr stable.   Plan: -Adjust vancomycin to 1000mg  IV q8h  -Will follow renal function, cultures and clinical progress   Height: 5\' 9"  (175.3 cm) Weight: 195 lb 3.2 oz (88.5 kg) IBW/kg (Calculated) : 66.2  Temp (24hrs), Avg:98.1 F (36.7 C), Min:97.6 F (36.4 C), Max:98.9 F (37.2 C)   Recent Labs Lab 09/04/17 1500 09/05/17 0602 09/05/17 1408 09/06/17 0405 09/06/17 1748 09/07/17 0406 09/08/17 0326 09/08/17 1907  WBC 13.6*  --   --  19.7*  --  15.0* 11.6*  --   CREATININE 0.70 0.61 0.60  --  0.69 0.55 0.53  --   VANCOTROUGH  --   --   --   --   --   --   --  8*    Estimated Creatinine Clearance: 82 mL/min (by C-G formula based on SCr of 0.53 mg/dL).    Allergies  Allergen Reactions  . Amoxicillin Anaphylaxis  . Ciprofloxacin Anaphylaxis  . Fluocinolone Rash    Reported by [facility] 12/09/11 - unknown to spouse  . Penicillins Anaphylaxis    Has patient had a PCN reaction causing immediate rash, facial/tongue/throat swelling, SOB or lightheadedness with hypotension: Yes Has patient had a PCN reaction causing severe rash involving mucus membranes or skin necrosis: No Has patient had a PCN reaction that required hospitalization: No Has patient had a PCN reaction occurring within the last 10 years: No If all of the above answers are "NO", then may proceed with Cephalosporin use.  . Sulfa Antibiotics Anaphylaxis  . Influenza Vaccines Swelling    Caused arm swelling    Antimicrobials this admission:  9/10 vancomycin >>  9/10 azactam>> 9/11   Microbiology results: 9/10 blood x2 - ngtd 9/10 resp- staph aureus   Thank you for allowing pharmacy to be a part of this patient's care.  Pollyann SamplesAndy Jatniel Verastegui, PharmD,  BCPS 09/08/2017, 8:23 PM

## 2017-09-08 NOTE — Evaluation (Signed)
Physical Therapy Evaluation Patient Details Name: Brenda Lloyd MRN: [id number] DOB: [birth year] Today's Date: 09/08/2017   History of Present Illness  65 y.o. female with known history of coronary artery disease presenting with several days of increasing chest pain and dyspnea. Patient developed worsening respiratory failure requiring emergent intubation. Patient was taken for cardiac catheterization revealing nonobstructive coronary artery disease. Patient was found to have a stress-induced cardiomyopathy pattern with LV dysfunction as well as mitral regurgitation. Staph Aureus tracheobronchitis as well;  Extubated 9/10;  has a past medical history of Hypertension.  Clinical Impression   Pt admitted with above diagnosis. Pt currently with functional limitations due to the deficits listed below (see PT Problem List). Ms. Copland was completely independent prior to admission, caring for 2 grandsons (toddler and 11yo); Presents now with significant weakness and deconditioning; Pt will benefit from skilled PT to increase their independence and safety with mobility to allow discharge to the venue listed below.    Was not on supplemental oxygen prior to admission; Briefly removed supplementa O2 to see response, and O2 sats dropped to 86%; Restarted O2 4liters, and recovered quickly back to greater than or equal to 95%; No significant change in BP noted between lying and sitting; Unable to stand long enough this session to obtain standing BP    Follow Up Recommendations CIR    Equipment Recommendations  Rolling walker with 5" wheels;3in1 (PT)    Recommendations for Other Services Rehab consult;OT consult     Precautions / Restrictions Precautions Precautions: Fall      Mobility  Bed Mobility Overal bed mobility: Needs Assistance Bed Mobility: Rolling;Sidelying to Sit Rolling: Min assist Sidelying to sit: Min assist       General bed mobility comments: Cues for technique; min assist  mostly for managing lines  Transfers Overall transfer level: Needs assistance Equipment used: 1 person hand held assist Transfers: Sit to/from Stand Sit to Stand: Min assist         General transfer comment: Cues for hand placement and to self-monitor for activity tolerance; Impulsively stood; noted decr control with stand to sit  Ambulation/Gait Ambulation/Gait assistance: Min assist Ambulation Distance (Feet):  (Pivotal steps bed to chair) Assistive device: 1 person hand held assist Gait Pattern/deviations: Shuffle     General Gait Details: min assist to steady  Stairs            Wheelchair Mobility    Modified Rankin (Stroke Patients Only)       Balance Overall balance assessment: Needs assistance Sitting-balance support: Feet supported;Single extremity supported;Bilateral upper extremity supported Sitting balance-Leahy Scale: Fair     Standing balance support: Single extremity supported;During functional activity Standing balance-Leahy Scale: Poor Standing balance comment: Noting decr standing tolerance; plan was to stand in front of chair long enough to obtain standing BP, however pt needed to sit sooner                             Pertinent Vitals/Pain Pain Assessment: No/denies pain ("just tired")    Home Living Family/patient expects to be discharged to:: Private residence Living Arrangements: Spouse/significant other Available Help at Discharge: Family;Available PRN/intermittently (near 24 hour) Type of Home: House Home Access: Stairs to enter Entrance Stairs-Rails: None Entrance Stairs-Number of Steps: 3 Home Layout: One level Home Equipment: None      Prior Function Level of Independence: Independent         Comments: Caring for 66yo and toddler  grandsons     Hand Dominance        Extremity/Trunk Assessment   Upper Extremity Assessment Upper Extremity Assessment: Defer to OT evaluation;Generalized weakness     Lower Extremity Assessment Lower Extremity Assessment: Generalized weakness       Communication   Communication: No difficulties  Cognition Arousal/Alertness: Awake/alert Behavior During Therapy: WFL for tasks assessed/performed Overall Cognitive Status: Impaired/Different from baseline Area of Impairment: Safety/judgement                         Safety/Judgement: Decreased awareness of safety;Decreased awareness of deficits            General Comments      Exercises     Assessment/Plan    PT Assessment Patient needs continued PT services  PT Problem List Decreased strength;Decreased activity tolerance;Decreased balance;Decreased mobility;Decreased coordination;Decreased cognition;Decreased knowledge of use of DME;Decreased safety awareness;Decreased knowledge of precautions;Cardiopulmonary status limiting activity       PT Treatment Interventions DME instruction;Gait training;Stair training;Functional mobility training;Therapeutic activities;Therapeutic exercise;Balance training;Patient/family education;Cognitive remediation    PT Goals (Current goals can be found in the Care Plan section)  Acute Rehab PT Goals Patient Stated Goal: Wants to get back to being independent PT Goal Formulation: With patient Time For Goal Achievement: 09/22/17 Potential to Achieve Goals: Good    Frequency Min 3X/week   Barriers to discharge        Co-evaluation               AM-PAC PT "6 Clicks" Daily Activity  Outcome Measure Difficulty turning over in bed (including adjusting bedclothes, sheets and blankets)?: A Little Difficulty moving from lying on back to sitting on the side of the bed? : A Lot Difficulty sitting down on and standing up from a chair with arms (e.g., wheelchair, bedside commode, etc,.)?: A Little Help needed moving to and from a bed to chair (including a wheelchair)?: A Lot Help needed walking in hospital room?: A Lot Help needed climbing  3-5 steps with a railing? : Total 6 Click Score: 13    End of Session Equipment Utilized During Treatment: Gait belt Activity Tolerance: Patient tolerated treatment well Patient left: in chair;with call bell/phone within reach;Other (comment) (chair alarm pad set, Nursing plans to hook up alarm) Nurse Communication: Mobility status PT Visit Diagnosis: Unsteadiness on feet (R26.81);Other abnormalities of gait and mobility (R26.89);Dizziness and giddiness (R42)    Time: [id number] PT Time Calculation (min) (ACUTE ONLY): 40 min   Charges:   PT Evaluation $PT Eval Moderate Complexity: 1 Mod PT Treatments $Therapeutic Activity: 23-37 mins   PT G Codes:        Van ClinesHolly Benzion Mesta, PT  Acute Rehabilitation Services Pager [phone] Office [phone]   Levi AlandHolly H Jade Burright 09/08/2017, 3:46 PM

## 2017-09-08 NOTE — Progress Notes (Signed)
Pharmacy Antibiotic Note  Brenda Lloyd is a 66 y.o. female staph aureus tracheobronchitis.  Pharmacy has been consulted for vancomycin dosing. She is on day 3 of antibiotics with plans to treat for 7 days total.  -WBC= 11.6, afebrile, SCr= 0.53 and CrCl ~ 80 -culture sensitivities pending  Plan: -Continue vancomycin 1000mg  IV q12h -Will check a vancomycin trough today -Will follow renal function, cultures and clinical progress\   Height: 5\' 9"  (175.3 cm) Weight: 195 lb 3.2 oz (88.5 kg) IBW/kg (Calculated) : 66.2  Temp (24hrs), Avg:98.4 F (36.9 C), Min:97.6 F (36.4 C), Max:98.9 F (37.2 C)   Recent Labs Lab 09/04/17 1500 09/05/17 0602 09/05/17 1408 09/06/17 0405 09/06/17 1748 09/07/17 0406 09/08/17 0326  WBC 13.6*  --   --  19.7*  --  15.0* 11.6*  CREATININE 0.70 0.61 0.60  --  0.69 0.55 0.53    Estimated Creatinine Clearance: 82 mL/min (by C-G formula based on SCr of 0.53 mg/dL).    Allergies  Allergen Reactions  . Amoxicillin Anaphylaxis  . Ciprofloxacin Anaphylaxis  . Fluocinolone Rash    Reported by [facility] 12/09/11 - unknown to spouse  . Penicillins Anaphylaxis    Has patient had a PCN reaction causing immediate rash, facial/tongue/throat swelling, SOB or lightheadedness with hypotension: Yes Has patient had a PCN reaction causing severe rash involving mucus membranes or skin necrosis: No Has patient had a PCN reaction that required hospitalization: No Has patient had a PCN reaction occurring within the last 10 years: No If all of the above answers are "NO", then may proceed with Cephalosporin use.  . Sulfa Antibiotics Anaphylaxis  . Influenza Vaccines Swelling    Caused arm swelling    Antimicrobials this admission: 9/10 vanc>>  9/10 azactam>> 9/11   Dose adjustments this admission:  Microbiology results: 9/10 blood x2 - ngtd 9/10 resp- staph aureus   Thank you for allowing pharmacy to be a part of this patient's care.  Harland GermanAndrew Tyresa Prindiville,  Pharm D 09/08/2017 8:18 AM

## 2017-09-08 NOTE — Progress Notes (Signed)
Rehab Admissions Coordinator Note:  Patient was screened by Clois DupesBoyette, Lakeria Starkman Godwin for appropriateness for an Inpatient Acute Rehab Consult per PT recommendation.   At this time, we are recommending Inpatient Rehab consult.  Clois DupesBoyette, Ashwini Jago Godwin 09/08/2017, 4:03 PM  I can be reached at [phone].

## 2017-09-08 NOTE — Progress Notes (Signed)
Progress Note  Patient Name: Brenda Lloyd Date of Encounter: 09/08/2017  Primary Cardiologist: New  Subjective   Alert and oriented, but always tired and a little groggy. Was mildly agitated overnight. No cough or fever. Tracheal aspirate grew staph aureus, but sensitivities pending. Echo to be performed today.  Inpatient Medications    Scheduled Meds: . aspirin EC  81 mg Oral Daily  . atorvastatin  80 mg Oral q1800  . chlorhexidine gluconate (MEDLINE KIT)  15 mL Mouth Rinse BID  . Chlorhexidine Gluconate Cloth  6 each Topical Daily  . heparin  5,000 Units Subcutaneous Q8H  . mouth rinse  15 mL Mouth Rinse QID  . pneumococcal 23 valent vaccine  0.5 mL Intramuscular Tomorrow-1000  . sodium chloride flush  10-40 mL Intracatheter Q12H  . sodium chloride flush  3 mL Intravenous Q12H  . sodium chloride flush  3 mL Intravenous Q12H   Continuous Infusions: . sodium chloride 250 mL (09/07/17 2200)  . vancomycin 1,000 mg (09/08/17 0932)   PRN Meds: sodium chloride, acetaminophen, ipratropium-albuterol, nitroGLYCERIN, ondansetron (ZOFRAN) IV, perflutren lipid microspheres (DEFINITY) IV suspension, sodium chloride flush, sodium chloride flush   Vital Signs    Vitals:   09/07/17 2320 09/08/17 0100 09/08/17 0401 09/08/17 1100  BP: 112/66 104/61 (!) 121/52   Pulse: 85 80 84 85  Resp: '13 16 19 ' (!) 23  Temp:   97.6 F (36.4 C)   TempSrc:   Oral   SpO2: 99% 94% 96% 100%  Weight:   195 lb 3.2 oz (88.5 kg)   Height:        Intake/Output Summary (Last 24 hours) at 09/08/17 1212 Last data filed at 09/08/17 1000  Gross per 24 hour  Intake             1599 ml  Output              900 ml  Net              699 ml   Filed Weights   09/06/17 0500 09/07/17 0500 09/08/17 0401  Weight: 198 lb 6.6 oz (90 kg) 195 lb 15.8 oz (88.9 kg) 195 lb 3.2 oz (88.5 kg)    Telemetry    NSR - Personally Reviewed  ECG    No new tracing - Personally Reviewed  Physical Exam  Calm,  relaxed. Looks drowsy but is easy to wake and is fully oriented GEN: No acute distress.   Neck: No JVD Cardiac: RRR, 2/6 holosystolic apical murmur, no diastolic murmurs, rubs, or gallops.  Respiratory: Clear to auscultation bilaterally. GI: Soft, nontender, non-distended  MS: No edema; No deformity. Neuro:  Nonfocal  Psych: Normal affect   Labs    Chemistry Recent Labs Lab 09/04/17 1500 09/05/17 0602  09/06/17 1748 09/07/17 0406 09/08/17 0326  NA 140 139  < > 135 135 139  K 3.6 3.5  < > 3.4* 3.5 3.2*  CL 105 107  < > 103 103 106  CO2 25 19*  --  '24 23 29  ' GLUCOSE 154* 154*  < > 184* 113* 111*  BUN 12 8  < > '12 9 6  ' CREATININE 0.70 0.61  < > 0.69 0.55 0.53  CALCIUM 9.7 8.8*  --  8.1* 8.5* 8.2*  PROT 7.7 7.0  --   --   --   --   ALBUMIN 4.1 3.8  --   --  2.8* 2.7*  AST 27 38  --   --   --   --  ALT 33 31  --   --   --   --   ALKPHOS 82 85  --   --   --   --   BILITOT 0.8 1.5*  --   --   --   --   GFRNONAA >60 >60  --  >60 >60 >60  GFRAA >60 >60  --  >60 >60 >60  ANIONGAP 10 13  --  8 9 4*  < > = values in this interval not displayed.   Hematology Recent Labs Lab 09/06/17 0405 09/07/17 0406 09/08/17 0326  WBC 19.7* 15.0* 11.6*  RBC 4.17 3.91 3.63*  HGB 11.6* 11.2* 10.1*  HCT 36.3 34.2* 31.6*  MCV 87.1 87.5 87.1  MCH 27.8 28.6 27.8  MCHC 32.0 32.7 32.0  RDW 14.0 14.1 13.7  PLT 318 202 216    Cardiac Enzymes Recent Labs Lab 09/04/17 1857 09/04/17 2105 09/05/17 0202 09/05/17 0602  TROPONINI 3.15* 2.35* 3.67* 1.88*   No results for input(s): TROPIPOC in the last 168 hours.   BNP Recent Labs Lab 09/04/17 1500 09/04/17 2105  BNP 43.2 147.3*     DDimer  Recent Labs Lab 09/04/17 1500  DDIMER <0.27     Radiology    No results found.  Cardiac Studies   Cardiac cath 09/05/2017  Dist LAD lesion, 65 %stenosed. Diffuse tapering from the mid vessel distally around the apex. The vessel has the appearance of potentially diffuse intramural  thrombus.  There is moderate to severe left ventricular systolic dysfunction. The left ventricular ejection fraction is 25-35% by visual estimate. - In a Takotsubo pattern  LV end diastolic pressure is moderately elevated. 22 mmHg  There is moderate (3+) mitral regurgitation.  There is no aortic valve stenosis.  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 There is pruning of the septal branches and area that should be covered with with a diagonal branch is empty.  Patient Profile     66 y.o. 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 Minor increase in cardiac troponin I to peak of 3.6.  Assessment & Plan    1. Systolic heart failure, acute: hemodynamically well compensated, off inotropes, suspect LVEF is already significantly improved. Maintaining good compensation off diuretics. Net +1.4 L since admission. Potassium 3.2, replaced today. 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 If EF has improved, there will be no need for ACE inhibitor/beta blockers. She clearly expresses a desire not to take medications. 3. Mitralregurgitation:The murmur is persistent, holosystolic, but seems less prominent compared to a couple of days ago. Reevaluate by echo. 4. 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 Potassium low today, will have a confounding effect, repeat when potassium normal 5. Thyroid nodule: Will need repeat thyroid function tests and endocrinology evaluation after recovery from the acute  illness. 6. CAD: Reviewed the angiograms. Really does not look like conventional atherosclerotic disease, but rather coronary spasm. Not sure she will require long-term lipid-lowering therapy (it sounds to me like she will decline it anyway). Would focus on lifestyle modification, primarily weight loss and increase physical activity which should also help improve her  low HDL level. 7. Seizure: She has been on Keppra since admission due to 1 witnessed seizure, while she was very ill and probably hypoxic. I wonder whether this medication may be contributing to her drowsiness and fatigue. Otherwise she seems to have made a full neurological recovery. She does not have a history of epilepsy. CT head was normal. Will discontinue the anti-seizure medication for now and ask for neurology's opinion regarding need for additional imaging studies and long-term treatment. 8. Suspected aspiration pneumonia: Tracheal aspirate showed Staphylococcus aureus. Waiting for sensitivities. Clinically no sign of active infection, WBC trending down. Discussed with Dr. Ashok Cordia, plan 7 days of total antibiotic therapy, tailored to sensitivities 9. Deconditioning: Physical therapy evaluation today to see when she'll be safe to return home and what type of continued rehabilitation she will need  For questions or updates, please contact [place] Please consult [email or web address] for contact info under Cardiology/STEMI.      Signed, Sanda Klein, MD  09/08/2017, 12:12 PM

## 2017-09-08 NOTE — Progress Notes (Signed)
  Echocardiogram 2D Echocardiogram has been performed.  Janalyn HarderWest, Ahnesti Townsend R 09/08/2017, 12:19 PM

## 2017-09-09 DIAGNOSIS — R5381 Other malaise: Secondary | ICD-10-CM

## 2017-09-09 LAB — GLUCOSE, CAPILLARY: Glucose-Capillary: 108 mg/dL — ABNORMAL HIGH (ref 65–99)

## 2017-09-09 LAB — BASIC METABOLIC PANEL
Anion gap: 6 (ref 5–15)
BUN: 7 mg/dL (ref 6–20)
CALCIUM: 8.4 mg/dL — AB (ref 8.9–10.3)
CO2: 29 mmol/L (ref 22–32)
Chloride: 106 mmol/L (ref 101–111)
Creatinine, Ser: 0.57 mg/dL (ref 0.44–1.00)
GFR calc Af Amer: >60 mL/min (ref >60)
GFR calc non Af Amer: >60 mL/min (ref >60)
GLUCOSE: 111 mg/dL — AB (ref 65–99)
Potassium: 3.5 mmol/L (ref 3.5–5.1)
SODIUM: 141 mmol/L (ref 135–145)

## 2017-09-09 LAB — CULTURE, RESPIRATORY

## 2017-09-09 LAB — CULTURE, RESPIRATORY W GRAM STAIN

## 2017-09-09 MED ORDER — DOXYCYCLINE HYCLATE 100 MG PO TABS
100.0000 mg | ORAL_TABLET | Freq: Two times a day (BID) | ORAL | Status: DC
  Administered 2017-09-09 – 2017-09-10 (×2): 100 mg via ORAL
  Filled 2017-09-09 (×2): qty 1

## 2017-09-09 MED ORDER — POTASSIUM CHLORIDE CRYS ER 10 MEQ PO TBCR
10.0000 meq | EXTENDED_RELEASE_TABLET | Freq: Two times a day (BID) | ORAL | Status: DC
  Administered 2017-09-09 – 2017-09-10 (×2): 10 meq via ORAL
  Filled 2017-09-09 (×2): qty 1

## 2017-09-09 MED ORDER — GUAIFENESIN-DM 100-10 MG/5ML PO SYRP
5.0000 mL | ORAL_SOLUTION | ORAL | Status: DC | PRN
  Administered 2017-09-09 (×3): 5 mL via ORAL
  Filled 2017-09-09 (×3): qty 5

## 2017-09-09 MED ORDER — POTASSIUM CHLORIDE CRYS ER 20 MEQ PO TBCR
20.0000 meq | EXTENDED_RELEASE_TABLET | Freq: Once | ORAL | Status: AC
  Administered 2017-09-09: 20 meq via ORAL
  Filled 2017-09-09: qty 1

## 2017-09-09 MED ORDER — FUROSEMIDE 40 MG PO TABS
40.0000 mg | ORAL_TABLET | Freq: Every day | ORAL | Status: DC
  Administered 2017-09-09 – 2017-09-10 (×2): 40 mg via ORAL
  Filled 2017-09-09 (×2): qty 1

## 2017-09-09 NOTE — Plan of Care (Signed)
Problem: Physical Regulation: Goal: Ability to maintain clinical measurements within normal limits will improve Outcome: Progressing VSS throughout night. Pt A/OX3-4. Pt was assisted to bedside commode w/ 1 assist. Per pt, feels "very weak". O2 weaned down to 4L [place] saturating >92%. Will continue to wean O2. Pt denies any pain at this time. Will continue to monitor.

## 2017-09-09 NOTE — Consult Note (Signed)
Physical Medicine and Rehabilitation Consult Reason for Consult: Decreased functional mobility related to stress-induced cardiomyopathy with respiratory failure Referring Physician: [facility]   HPI: Brenda Lloyd is a 66 y.o. right handed female with history of hypertension, mitral valve prolapse. Per chart review Brenda Lloyd lives with spouse independent prior to admission one level home with 3 steps to entry. Presented 09/04/2017 with intermittent chest pain over a 5 day without radiation as well as shortness of breath and nausea. There was some concern of possible seizure with EEG completed that was negative Troponin elevated 3.15. EKG normal sinus rhythm with Q waves anteriorly, mild ST elevation. Oxygen saturations dipping into the 80s. Cardiac catheterization showed distal LAD lesion, 65% stenosed area and diffuse tapering from the mid vessel distally around the apex. The vessel had appearance of potentially diffuse intramural thrombus. Moderate to severe left ventricular systolic dysfunction. Ejection fraction 25-35%. Findings most consistent with takotsubo cardiomyopathy. Hospital course Brenda Lloyd had required intubation for respiratory failure. A follow-up echocardiogram was completed showing ejection fraction of 35% no evidence of thrombus. Current medical management with aspirin. Brenda Lloyd was staph aureus tracheobronchitis currently maintained on antibiotic coverage. Brenda Lloyd was extubated 09/06/2017. Physical therapy evaluation 09/08/2017 for deconditioning/impulsivity with recommendations of physical medicine rehabilitation consult.   Review of Systems  Constitutional: Negative for chills and fever.  HENT: Negative for hearing loss.   Eyes: Negative for blurred vision and double vision.  Respiratory: Positive for shortness of breath. Negative for cough.   Cardiovascular: Positive for chest pain. Negative for leg swelling.  Gastrointestinal: Positive for constipation and nausea. Negative for  vomiting.  Genitourinary: Negative for dysuria, flank pain and hematuria.  Musculoskeletal: Positive for myalgias.  Skin: Negative for rash.  All other systems reviewed and are negative.  Past Medical History:  Diagnosis Date  . Hypertension    Past Surgical History:  Procedure Laterality Date  . LEFT HEART CATH AND CORONARY ANGIOGRAPHY N/A 09/05/2017   Procedure: LEFT HEART CATH AND CORONARY ANGIOGRAPHY;  Surgeon: Marykay LexHarding, David W, MD;  [facility];  Service: Cardiovascular;  Laterality: N/A;   No family history on file. Social History:  reports that Brenda Lloyd has never smoked. Brenda Lloyd has never used smokeless tobacco. Brenda Lloyd reports that Brenda Lloyd drinks alcohol. Brenda Lloyd reports that Brenda Lloyd does not use drugs. Allergies:  Allergies  Allergen Reactions  . Amoxicillin Anaphylaxis  . Ciprofloxacin Anaphylaxis  . Fluocinolone Rash    Reported by [facility] 12/09/11 - unknown to spouse  . Penicillins Anaphylaxis    Has Brenda Lloyd had a PCN reaction causing immediate rash, facial/tongue/throat swelling, SOB or lightheadedness with hypotension: Yes Has Brenda Lloyd had a PCN reaction causing severe rash involving mucus membranes or skin necrosis: No Has Brenda Lloyd had a PCN reaction that required hospitalization: No Has Brenda Lloyd had a PCN reaction occurring within the last 10 years: No If all of the above answers are "NO", then may proceed with Cephalosporin use.  . Sulfa Antibiotics Anaphylaxis  . Influenza Vaccines Swelling    Caused arm swelling   Medications Prior to Admission  Medication Sig Dispense Refill  . acetaminophen (TYLENOL) 500 MG tablet Take 1,000 mg by mouth every 6 (six) hours as needed (pain).    Marland Kitchen. aspirin EC 81 MG tablet Take 81 mg by mouth daily.    Marland Kitchen. ibuprofen (ADVIL,MOTRIN) 200 MG tablet Take 400 mg by mouth every 6 (six) hours as needed for headache (pain).      Home: Home Living Family/Brenda Lloyd expects to be discharged  to:: Private residence Living Arrangements: Spouse/significant  other Available Help at Discharge: Family, Available PRN/intermittently (near 24 hour) Type of Home: House Home Access: Stairs to enter Secretary/administrator of Steps: 3 Entrance Stairs-Rails: None Home Layout: One level Bathroom Shower/Tub:  (tba) Home Equipment: None  Functional History: Prior Function Level of Independence: Independent Comments: Caring for 66yo and toddler grandsons Functional Status:  Mobility: Bed Mobility Overal bed mobility: Needs Assistance Bed Mobility: Rolling, Sidelying to Sit Rolling: Min assist Sidelying to sit: Min assist General bed mobility comments: Cues for technique; min assist mostly for managing lines Transfers Overall transfer level: Needs assistance Equipment used: 1 person hand held assist Transfers: Sit to/from Stand Sit to Stand: Min assist General transfer comment: Cues for hand placement and to self-monitor for activity tolerance; Impulsively stood; noted decr control with stand to sit Ambulation/Gait Ambulation/Gait assistance: Min assist Ambulation Distance (Feet):  (Pivotal steps bed to chair) Assistive device: 1 person hand held assist Gait Pattern/deviations: Shuffle General Gait Details: min assist to steady    ADL:    Cognition: Cognition Overall Cognitive Status: Impaired/Different from baseline Orientation Level: Oriented to person, Oriented to place, Oriented to situation, Disoriented to time Cognition Arousal/Alertness: Awake/alert Behavior During Therapy: WFL for tasks assessed/performed Overall Cognitive Status: Impaired/Different from baseline Area of Impairment: Safety/judgement Safety/Judgement: Decreased awareness of safety, Decreased awareness of deficits  Blood pressure 122/75, pulse 77, temperature 97.8 F (36.6 C), temperature source Oral, resp. rate 19, height  (1.753 m), weight 85.1 kg (187 lb 11.2 oz), SpO2 96 %. Physical Exam  Constitutional: Brenda Lloyd appears well-developed.  HENT:  Head:  Normocephalic.  Eyes: EOM are normal.  Neck: Normal range of motion. Neck supple. No thyromegaly present.  Cardiovascular: Normal rate and regular rhythm.   Respiratory: Effort normal and breath sounds normal. No respiratory distress.  No dyspnea with conversation  GI: Soft. Bowel sounds are normal. Brenda Lloyd exhibits no distension.  Neurological: Brenda Lloyd is alert.  Brenda Lloyd is a bit anxious. Brenda Lloyd does follow commands. Provides her name. Brenda Lloyd does have some limited recall over her hospital stay. UE motor 4/5 prox to distal. LE: 3+HF to 4/5 ADF/PF. No sensory deficits.   Skin: Skin is warm and dry.  Psychiatric: Brenda Lloyd has a normal mood and affect. Her behavior is normal.    Results for orders placed or performed during the hospital encounter of 09/04/17 (from the past 24 hour(s))  Glucose, capillary     Status: Abnormal   Collection Time: 09/08/17  7:40 AM  Result Value Ref Range   Glucose-Capillary 101 (H) 65 - 99 mg/dL  Vancomycin, trough     Status: Abnormal   Collection Time: 09/08/17  7:07 PM  Result Value Ref Range   Vancomycin Tr 8 (L) 15 - 20 ug/mL  Basic metabolic panel     Status: Abnormal   Collection Time: 09/09/17  2:19 AM  Result Value Ref Range   Sodium 141 135 - 145 mmol/L   Potassium 3.5 3.5 - 5.1 mmol/L   Chloride 106 101 - 111 mmol/L   CO2 29 22 - 32 mmol/L   Glucose, Bld 111 (H) 65 - 99 mg/dL   BUN 7 6 - 20 mg/dL   Creatinine, Ser 1.91 0.44 - 1.00 mg/dL   Calcium 8.4 (L) 8.9 - 10.3 mg/dL   GFR calc non Af Amer >60 >60 mL/min   GFR calc Af Amer >60 >60 mL/min   Anion gap 6 5 - 15   No results found.  Assessment/Plan:  Diagnosis: profound deconditioning related to Takotsubo cardiomyopathy/cardiogenic shock and respiratory failure/aspiration pneumonia 1. Does the need for close, 24 hr/day medical supervision in concert with the Brenda Lloyd's rehab needs make it unreasonable for this Brenda Lloyd to be served in a less intensive setting? Yes 2. Co-Morbidities requiring  supervision/potential complications: tracheobronchitis, MR, hypokalemia, CAD, recent seizure 3. Due to bladder management, bowel management, safety, skin/wound care, disease management, medication administration, pain management and Brenda Lloyd education, does the Brenda Lloyd require 24 hr/day rehab nursing? Yes 4. Does the Brenda Lloyd require coordinated care of a physician, rehab nurse, Brenda Lloyd (1-2 hrs/day, 5 days/week) and OT (1-2 hrs/day, 5 days/week) to address physical and functional deficits in the context of the above medical diagnosis(es)? Yes Addressing deficits in the following areas: balance, endurance, locomotion, strength, transferring, bowel/bladder control, bathing, dressing, feeding, grooming, toileting and psychosocial support 5. Can the Brenda Lloyd actively participate in an intensive therapy program of at least 3 hrs of therapy per day at least 5 days per week? Yes 6. The potential for Brenda Lloyd to make measurable gains while on inpatient rehab is excellent 7. Anticipated functional outcomes upon discharge from inpatient rehab are modified independent  with Brenda Lloyd, modified independent with OT, n/a with SLP. 8. Estimated rehab length of stay to reach the above functional goals is: 7 days 9. Anticipated D/C setting: Home 10. Anticipated post D/C treatments: HH therapy 11. Overall Rehab/Functional Prognosis: excellent  RECOMMENDATIONS: This Brenda Lloyd's condition is appropriate for continued rehabilitative care in the following setting: CIR Brenda Lloyd has agreed to participate in recommended program. Yes Note that insurance prior authorization may be required for reimbursement for recommended care.  Comment: Brenda Lloyd was independent PTA and is motivated to improve her mobility/balance/stamina. Inpatient rehab is an ideal environment to push her functionally while at the same time closely monitoring her cardiac needs. Rehab Admissions Coordinator to follow up.  Thanks,  Ranelle Oyster, MD,  [place]    Charlton Amor., PA-C 09/09/2017

## 2017-09-09 NOTE — Progress Notes (Signed)
Physical Therapy Treatment Patient Details Name: Brenda Lloyd MRN: [id number] DOB: 1951-06-23 Today's Date: 09/09/2017    History of Present Illness 66 y.o. female with known history of coronary artery disease presenting with several days of increasing chest pain and dyspnea. Patient developed worsening respiratory failure requiring emergent intubation. Patient was taken for cardiac catheterization revealing nonobstructive coronary artery disease. Patient was found to have a stress-induced cardiomyopathy pattern with LV dysfunction as well as mitral regurgitation. Staph Aureus tracheobronchitis as well;  Extubated 9/10;  has a past medical history of Hypertension.    PT Comments    Patient progressing to hallway ambulation this session.  Determined to not use walker, but was rather unstable needing up to mod support for balance at times.  Desaturated to 83% initially when attempting to ambulate on RA.  Patient perseverative on how active she usually is and how weak she feels now even to the point that it would be a huge struggle to take a shower.  Feel she remains appropriate for CIR level therapies prior to return home.    Follow Up Recommendations  CIR     Equipment Recommendations  Rolling walker with 5" wheels;3in1 (PT)    Recommendations for Other Services Rehab consult;OT consult     Precautions / Restrictions Precautions Precautions: Fall Precaution Comments: watch O2 sats    Mobility  Bed Mobility Overal bed mobility: Needs Assistance Bed Mobility: Supine to Sit     Supine to sit: HOB elevated;Supervision     General bed mobility comments: increased time, assist for lines, reliance on railings  Transfers Overall transfer level: Needs assistance Equipment used: 1 person hand held assist Transfers: Sit to/from Stand Sit to Stand: Min assist         General transfer comment: for balance, pt did not want to use assistive device, unsteady in  standing  Ambulation/Gait Ambulation/Gait assistance: Min assist;Mod assist Ambulation Distance (Feet): 160 Feet Assistive device: None Gait Pattern/deviations: Step-through pattern;Decreased stride length;Drifts right/left;Shuffle     General Gait Details: assist for balance up to mod A at times due to unsteady and declined to use walker.  SpO2 during ambulation on 2L O2 maintained above 90%; on RA SpO2 droppted to 83% and pt reported SOB/acvitity intolerance   Stairs            Wheelchair Mobility    Modified Rankin (Stroke Patients Only)       Balance Overall balance assessment: Needs assistance Sitting-balance support: Feet supported Sitting balance-Leahy Scale: Good     Standing balance support: No upper extremity supported Standing balance-Leahy Scale: Poor Standing balance comment: needs assistance in standing without UE support                            Cognition Arousal/Alertness: Awake/alert Behavior During Therapy: WFL for tasks assessed/performed Overall Cognitive Status: Impaired/Different from baseline Area of Impairment: Safety/judgement                         Safety/Judgement: Decreased awareness of deficits;Decreased awareness of safety            Exercises      General Comments General comments (skin integrity, edema, etc.): SpO2 on RA at rest 91%, so 2L O2 reapplied per RN; spouse in room and discussed d/c options.  Patient feels would need to be able to assist her family when returning home as she was previous to illness (i.e.  keeping up with busy 7418 month old during the day and caring for 66 y/o grandson full time.  Reports if she had to take a shower right now, she just couldn't because it would "wear her out".      Pertinent Vitals/Pain Pain Assessment: No/denies pain    Home Living                      Prior Function            PT Goals (current goals can now be found in the care plan section)  Progress towards PT goals: Progressing toward goals    Frequency    Min 3X/week      PT Plan Current plan remains appropriate    Co-evaluation              AM-PAC PT "6 Clicks" Daily Activity  Outcome Measure  Difficulty turning over in bed (including adjusting bedclothes, sheets and blankets)?: A Little Difficulty moving from lying on back to sitting on the side of the bed? : A Lot Difficulty sitting down on and standing up from a chair with arms (e.g., wheelchair, bedside commode, etc,.)?: Unable Help needed moving to and from a bed to chair (including a wheelchair)?: A Little Help needed walking in hospital room?: A Little Help needed climbing 3-5 steps with a railing? : A Lot 6 Click Score: 14    End of Session Equipment Utilized During Treatment: Gait belt Activity Tolerance: Patient limited by fatigue Patient left: in chair;with call bell/phone within reach;with family/visitor present   PT Visit Diagnosis: Unsteadiness on feet (R26.81);Other abnormalities of gait and mobility (R26.89);Dizziness and giddiness (R42)     Time: [id number] PT Time Calculation (min) (ACUTE ONLY): 23 min  Charges:  $Gait Training: 23-37 mins                    G CodesSheran Lloyd:      Brenda Lloyd, [place]PT [phone] 09/09/2017    Brenda Lloyd 09/09/2017, 12:14 PM

## 2017-09-09 NOTE — Evaluation (Signed)
Occupational Therapy Evaluation Patient Details Name: Brenda AweCharlotte D Lloyd MRN: [id number] DOB: 1951/10/02 Today's Date: 09/09/2017    History of Present Illness 66 y.o. female with known history of coronary artery disease presenting with several days of increasing chest pain and dyspnea. Patient developed worsening respiratory failure requiring emergent intubation. Patient was taken for cardiac catheterization revealing nonobstructive coronary artery disease. Patient was found to have a stress-induced cardiomyopathy pattern with LV dysfunction as well as mitral regurgitation. Staph Aureus tracheobronchitis as well;  Extubated 9/10;  has a past medical history of Hypertension.   Clinical Impression   Pt with decline in function and safety with ADLs and ADL mobility with decreased strength, balance and endurance. Pt on 2 L O2 and O2 SATs dropping to 58-86% during activity, but pt quickly recovered to >90% after deep breathing instructions from OT. Pt would benefit from acute OT services to address impairments to increase level of function and safety    Follow Up Recommendations  CIR    Equipment Recommendations  Other (comment);Tub/shower bench (TBD at next venue of care)    Recommendations for Other Services Rehab consult     Precautions / Restrictions Precautions Precautions: Fall Precaution Comments: watch O2 sats Restrictions Weight Bearing Restrictions: No      Mobility Bed Mobility Overal bed mobility: Needs Assistance Bed Mobility: Supine to Sit     Supine to sit: HOB elevated;Supervision     General bed mobility comments: pt up in recliner upon arrival  Transfers Overall transfer level: Needs assistance Equipment used: 1 person hand held assist;Rolling walker (2 wheeled) Transfers: Sit to/from Stand Sit to Stand: Min assist         General transfer comment: used RW to ambulate to bathroom, cues for correct hand placement    Balance Overall balance assessment:  Needs assistance Sitting-balance support: Feet supported Sitting balance-Leahy Scale: Good     Standing balance support: No upper extremity supported Standing balance-Leahy Scale: Poor Standing balance comment: needs assistance in standing without UE support                           ADL either performed or assessed with clinical judgement   ADL Overall ADL's : Needs assistance/impaired     Grooming: Wash/dry hands;Wash/dry face;oral care;Min guard;Standing   Upper Body Bathing: Min guard;Standing   Lower Body Bathing: Moderate assistance   Upper Body Dressing : Min guard;Standing   Lower Body Dressing: Moderate assistance   Toilet Transfer: Moderate assistance   Toileting- Clothing Manipulation and Hygiene: Min guard       Functional mobility during ADLs: Minimal assistance;Cueing for safety;Rolling walker       Vision Baseline Vision/History: No visual deficits Patient Visual Report: No change from baseline                  Pertinent Vitals/Pain Pain Assessment: No/denies pain     Hand Dominance Right   Extremity/Trunk Assessment Upper Extremity Assessment Upper Extremity Assessment: Generalized weakness   Lower Extremity Assessment Lower Extremity Assessment: Defer to PT evaluation   Cervical / Trunk Assessment Cervical / Trunk Assessment: Normal   Communication Communication Communication: No difficulties   Cognition Arousal/Alertness: Awake/alert Behavior During Therapy: WFL for tasks assessed/performed Overall Cognitive Status: Impaired/Different from baseline Area of Impairment: Safety/judgement                         Safety/Judgement: Decreased awareness of deficits;Decreased awareness of  safety         General Comments  Pt very pleasant, cooperative and motivated               Home Living Family/patient expects to be discharged to:: Private residence Living Arrangements: Spouse/significant  other Available Help at Discharge: Family;Available PRN/intermittently Type of Home: House Home Access: Stairs to enter [employer] of Steps: 3   Home Layout: One level     Bathroom Shower/Tub: Chief Strategy Officer: Standard     Home Equipment: None          Prior Functioning/Environment Level of Independence: Independent        Comments: Caring for 66yo and toddler grandsons        OT Problem List: Decreased strength;Impaired balance (sitting and/or standing);Decreased activity tolerance;Decreased knowledge of use of DME or AE      OT Treatment/Interventions: Self-care/ADL training;DME and/or AE instruction;Therapeutic activities;Balance training;Therapeutic exercise;Patient/family education    OT Goals(Current goals can be found in the care plan section) Acute Rehab OT Goals Patient Stated Goal: Wants to get back to being independent OT Goal Formulation: With patient/family Time For Goal Achievement: 09/16/17 Potential to Achieve Goals: Good ADL Goals Pt Will Perform Grooming: with supervision;with set-up;standing Pt Will Perform Upper Body Bathing: with supervision;with set-up;standing Pt Will Perform Lower Body Bathing: with min assist;sit to/from stand Pt Will Perform Upper Body Dressing: with supervision;with set-up;standing Pt Will Perform Lower Body Dressing: with min assist;sit to/from stand Pt Will Transfer to Toilet: with min guard assist;with supervision;ambulating;regular height toilet;grab bars Pt Will Perform Toileting - Clothing Manipulation and hygiene: with supervision;sit to/from stand  OT Frequency: Min 2X/week   Barriers to D/C: Decreased caregiver support          Co-evaluation              AM-PAC PT "6 Clicks" Daily Activity     Outcome Measure Help from another person eating meals?: None Help from another person taking care of personal grooming?: A Little Help from another person toileting, which includes  using toliet, bedpan, or urinal?: A Lot Help from another person bathing (including washing, rinsing, drying)?: A Lot Help from another person to put on and taking off regular upper body clothing?: None Help from another person to put on and taking off regular lower body clothing?: A Lot 6 Click Score: 17   End of Session Equipment Utilized During Treatment: Gait belt;Oxygen  Activity Tolerance: Patient tolerated treatment well Patient left: in chair;with call bell/phone within reach;with family/visitor present  OT Visit Diagnosis: Muscle weakness (generalized) (M62.81);Unsteadiness on feet (R26.81)                Time: 1610-9604 OT Time Calculation (min): 55 min Charges:  OT General Charges $OT Visit: 1 Visit OT Evaluation $OT Eval Moderate Complexity: 1 Mod OT Treatments $Self Care/Home Management : 23-37 mins $Therapeutic Activity: 8-22 mins G-Codes: OT G-codes **NOT FOR INPATIENT CLASS** Functional Assessment Tool Used: AM-PAC 6 Clicks Daily Activity     Galen Manila 09/09/2017, 3:12 PM

## 2017-09-09 NOTE — Progress Notes (Addendum)
Progress Note  Patient Name: Brenda Lloyd Date of Encounter: 09/09/2017  Primary Cardiologist: new - Dr. Sallyanne Kuster  Subjective   Pt much more awake today. Patient is feeling well; denies chest pain, SOB, and palpitations. She is interested in inpatient rehab (CIR).  Inpatient Medications    Scheduled Meds: . aspirin EC  81 mg Oral Daily  . atorvastatin  80 mg Oral q1800  . chlorhexidine gluconate (MEDLINE KIT)  15 mL Mouth Rinse BID  . Chlorhexidine Gluconate Cloth  6 each Topical Daily  . heparin  5,000 Units Subcutaneous Q8H  . mouth rinse  15 mL Mouth Rinse QID  . pneumococcal 23 valent vaccine  0.5 mL Intramuscular Tomorrow-1000  . potassium chloride  20 mEq Oral BID  . sodium chloride flush  10-40 mL Intracatheter Q12H  . sodium chloride flush  3 mL Intravenous Q12H  . sodium chloride flush  3 mL Intravenous Q12H   Continuous Infusions: . sodium chloride 250 mL (09/07/17 2200)  . vancomycin Stopped (09/09/17 0625)   PRN Meds: sodium chloride, acetaminophen, ipratropium-albuterol, nitroGLYCERIN, ondansetron (ZOFRAN) IV, sodium chloride flush, sodium chloride flush   Vital Signs    Vitals:   09/08/17 1900 09/08/17 1935 09/09/17 0226 09/09/17 0445  BP: 111/64 104/60 122/71 122/75  Pulse:  83 79 77  Resp:  (!) 28 (!) 22 19  Temp:  97.7 F (36.5 C) 97.6 F (36.4 C) 97.8 F (36.6 C)  TempSrc:  Oral Oral Oral  SpO2: 99% 99% 100% 96%  Weight:    187 lb 11.2 oz (85.1 kg)  Height:        Intake/Output Summary (Last 24 hours) at 09/09/17 0849 Last data filed at 09/09/17 0753  Gross per 24 hour  Intake             1025 ml  Output             1700 ml  Net             -675 ml   Filed Weights   09/07/17 0500 09/08/17 0401 09/09/17 0445  Weight: 195 lb 15.8 oz (88.9 kg) 195 lb 3.2 oz (88.5 kg) 187 lb 11.2 oz (85.1 kg)     Physical Exam   General: Well developed, well nourished, female appearing in no acute distress. Head: Normocephalic,  atraumatic.  Neck: Supple without bruits, JVD Lungs:  Resp regular and unlabored, CTA. Heart: RRR, S1, S2, no S3, S4, soft systolic murmur; no rub. Abdomen: Soft, non-tender, non-distended with normoactive bowel sounds. No hepatomegaly. No rebound/guarding. No obvious abdominal masses. Extremities: No clubbing, cyanosis, trace edema. Distal pedal pulses are 2+ bilaterally. Neuro: Alert and oriented X 3. Moves all extremities spontaneously. Psych: Normal affect.  Labs    Chemistry Recent Labs Lab 09/04/17 1500 09/05/17 0602  09/07/17 0406 09/08/17 0326 09/09/17 0219  NA 140 139  < > 135 139 141  K 3.6 3.5  < > 3.5 3.2* 3.5  CL 105 107  < > 103 106 106  CO2 25 19*  < > _0 GLUCOSE 154* 154*  < > 113* 111* 111*  BUN 12 8  < > _1 CREATININE 0.70 0.61  < > 0.55 0.53 0.57  CALCIUM 9.7 8.8*  < > 8.5* 8.2* 8.4*  PROT 7.7 7.0  --   --   --   --   ALBUMIN 4.1 3.8  --  2.8* 2.7*  --   AST 27 38  --   --   --   --  ALT 33 31  --   --   --   --   ALKPHOS 82 85  --   --   --   --   BILITOT 0.8 1.5*  --   --   --   --   GFRNONAA >60 >60  < > >60 >60 >60  GFRAA >60 >60  < > >60 >60 >60  ANIONGAP 10 13  < > 9 4* 6  < > = values in this interval not displayed.   Hematology Recent Labs Lab 09/06/17 0405 09/07/17 0406 09/08/17 0326  WBC 19.7* 15.0* 11.6*  RBC 4.17 3.91 3.63*  HGB 11.6* 11.2* 10.1*  HCT 36.3 34.2* 31.6*  MCV 87.1 87.5 87.1  MCH 27.8 28.6 27.8  MCHC 32.0 32.7 32.0  RDW 14.0 14.1 13.7  PLT 318 202 216    Cardiac Enzymes Recent Labs Lab 09/04/17 1857 09/04/17 2105 09/05/17 0202 09/05/17 0602  TROPONINI 3.15* 2.35* 3.67* 1.88*   No results for input(s): TROPIPOC in the last 168 hours.   BNP Recent Labs Lab 09/04/17 1500 09/04/17 2105  BNP 43.2 147.3*     DDimer  Recent Labs Lab 09/04/17 1500  DDIMER <0.27     Radiology    No results found.   Telemetry    NSR - Personally Reviewed  ECG    NSR with QTc 504 ms, diffuse  inverted T waves - Personally Reviewed   Cardiac Studies   Echocardiogram 09/08/17: Study Conclusions - Left ventricle: The cavity size was normal. There was moderate   concentric hypertrophy. Systolic function was moderately to   severely reduced. The estimated ejection fraction was in the   range of 30% to 35%. There is akinesis of the anteroseptal,   lateral, inferior, and apical myocardium. Basal contractile   sparing noted. Consider stress-induced cardiomyopathy. Acoustic   contrast opacification revealed no evidence ofthrombus. - Aortic valve: Trileaflet; mildly thickened, mildly calcified   leaflets. - Mitral valve: Mildly thickened leaflets . There was mild   regurgitation.   Left heart cath 09/05/17:  Dist LAD lesion, 65 %stenosed. Diffuse tapering from the mid vessel distally around the apex. The vessel has the appearance of potentially diffuse intramural thrombus.  There is moderate to severe left ventricular systolic dysfunction. The left ventricular ejection fraction is 25-35% by visual estimate. - In a Takotsubo pattern  LV end diastolic pressure is moderately elevated. 22 mmHg  There is moderate (3+) mitral regurgitation.  There is no aortic valve stenosis.   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 There is pruning of the septal branches and area that should be covered with with a diagonal branch is empty.  The patient was somewhat hypotensive due to sedation in the Cath Lab, but does not have the appearance of being a cardiac shock. Her blood pressures were mostly in the 110s. I did start low-dose Levophed to allow for sedation as she became agitated with the ventilator in place.  The patient was transferred back to the CCU for supportive care. ABG looked excellent. I suspect that she would benefit from at  least one day of rest and potentially another dose of IV Lasix depending on her pressures.  As her pressure tolerates, we can wean off Levophed and potentially consider adding either amlodipine or Imdur for her definitive diffuse LAD disease.  Patient Profile  66 y.o. 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 Minor increase in cardiac troponin I to peak of 3.6.  Assessment & Plan    1. Acute systolic heart failure - appears euvolemic on exam, pressures have improved - weight is 187 lbs, from 184 lbs on admission - she is overall net positive 400 cc with 1.4L urine output yesterday   2. Suspected takotsubo  - repeat echo with LVEF 30-35%, slight improvement compared to heart cath data - pt would benefit from ARB, will hold ACEI as she continues to have a cough s/p vent - add low dose losartan when pressure improves and is stable (sBP 99 on my exam today)  - she is amenable to starting heart failure medications; start low dose beta blocker if pressure stable - will need repeat echo OP   3. Mitral regurgitation - mild MR on repeat echo, stable; rated as moderate by cath - soft murmur   4. Prolonged QT - QTc 504 ms in setting of K 3.5 - continue to hold prolonging agents   5.  Hypokalemia - improving, K 3.5 (3.2), added extra Kdur today - sCr normal   6. CAD - 65% stenosis of distal LAD, nonobstructive - appearance of intramural thrombus in LAD - mentions possible coronary spasm - LDL 133, started high dose lipitor   7. Seizure - 1 witness seizures when she was very ill likely 2/2 hypoxia - keppra for seizure PPX, may be contributing to her drowsiness - D/C'ed [facility] - MRI brain pending, per  neurology   8. Suspected aspiration PNA - staph aureus in trachial aspirate - will continue ABX for 7 days    9. Deconditioning - PT recs SNF at discharge   10. Thyroid nodule - follow up as outpatient with repeat thyroid function tests after she has recovered from this hospitalization    Signed, Ledora Bottcher , PA-C 8:49 AM 09/09/2017 Pager: [phone]   I have seen and examined the patient along with Ledora Bottcher , PA-C.  I have reviewed the chart, notes and new data.  I agree with PA's note.  Key new complaints: still very weak, but walked in hallway with assistance Key examination changes: no overt hypervolemia by exam, but O2 sat dropped with walking, apical murmur is soft Key new findings / data: echo shows partial improvement in LVEF, reduced MR (now mild)  PLAN: Oral diuretic for a few days, til EF fully recovers. BP may not tolerate ARB or beta blocker. EF expected to improve spontaneously in next several days. CIR is an excellent fit for her. I think she will be ready for that by tomorrow. Switch IV antibiotics to PO Doxycycline (pan sensitive S. Aureus, patient allergic to PCN, cipro and sulfa).   Sanda Klein, MD, [place] [phone] 09/09/2017, 12:06 PM

## 2017-09-09 NOTE — Care Management Important Message (Signed)
Important Message  Patient Details  Name: Brenda AweCharlotte D Jamerson MRN: [id number] Date of Birth: 09-10-1951   Medicare Important Message Given:  Yes    Kyla BalzarineShealy, Shad Ledvina Abena 09/09/2017, 9:23 AM

## 2017-09-09 NOTE — Progress Notes (Signed)
I will begin insurance authorization for a possible inpt rehab admit pending approval and bed availability when pt medically ready to d/c. [phone]

## 2017-09-09 NOTE — Progress Notes (Signed)
MRI brain still pending. Please call Neurohospitalist service to discuss MRI when it is completed.   Electronically signed: Dr. Caryl PinaEric Nina Hoar

## 2017-09-10 ENCOUNTER — Inpatient Hospital Stay (HOSPITAL_COMMUNITY)
Admission: RE | Admit: 2017-09-10 | Discharge: 2017-09-14 | DRG: 945 | Disposition: A | Discharge status: Home / Self Care | Payer: Medicare Other | Source: Intra-hospital | Attending: Physical Medicine & Rehabilitation | Admitting: Physical Medicine & Rehabilitation

## 2017-09-10 DIAGNOSIS — I509 Heart failure, unspecified: Secondary | ICD-10-CM

## 2017-09-10 DIAGNOSIS — I5021 Acute systolic (congestive) heart failure: Secondary | ICD-10-CM | POA: Diagnosis present

## 2017-09-10 DIAGNOSIS — I110 Hypertensive heart disease with heart failure: Secondary | ICD-10-CM | POA: Diagnosis present

## 2017-09-10 DIAGNOSIS — E785 Hyperlipidemia, unspecified: Secondary | ICD-10-CM | POA: Diagnosis present

## 2017-09-10 DIAGNOSIS — I259 Chronic ischemic heart disease, unspecified: Secondary | ICD-10-CM | POA: Diagnosis present

## 2017-09-10 DIAGNOSIS — I5181 Takotsubo syndrome: Secondary | ICD-10-CM | POA: Diagnosis present

## 2017-09-10 DIAGNOSIS — K5900 Constipation, unspecified: Secondary | ICD-10-CM | POA: Diagnosis present

## 2017-09-10 DIAGNOSIS — I10 Essential (primary) hypertension: Secondary | ICD-10-CM

## 2017-09-10 DIAGNOSIS — R5381 Other malaise: Principal | ICD-10-CM | POA: Diagnosis present

## 2017-09-10 DIAGNOSIS — J40 Bronchitis, not specified as acute or chronic: Secondary | ICD-10-CM | POA: Diagnosis present

## 2017-09-10 DIAGNOSIS — Z7982 Long term (current) use of aspirin: Secondary | ICD-10-CM | POA: Diagnosis not present

## 2017-09-10 DIAGNOSIS — D72829 Elevated white blood cell count, unspecified: Secondary | ICD-10-CM | POA: Diagnosis present

## 2017-09-10 DIAGNOSIS — B9561 Methicillin susceptible Staphylococcus aureus infection as the cause of diseases classified elsewhere: Secondary | ICD-10-CM | POA: Diagnosis present

## 2017-09-10 DIAGNOSIS — R7989 Other specified abnormal findings of blood chemistry: Secondary | ICD-10-CM | POA: Diagnosis present

## 2017-09-10 DIAGNOSIS — I341 Nonrheumatic mitral (valve) prolapse: Secondary | ICD-10-CM | POA: Diagnosis present

## 2017-09-10 DIAGNOSIS — Z882 Allergy status to sulfonamides status: Secondary | ICD-10-CM | POA: Diagnosis not present

## 2017-09-10 DIAGNOSIS — E7800 Pure hypercholesterolemia, unspecified: Secondary | ICD-10-CM

## 2017-09-10 DIAGNOSIS — T466X5A Adverse effect of antihyperlipidemic and antiarteriosclerotic drugs, initial encounter: Secondary | ICD-10-CM | POA: Diagnosis not present

## 2017-09-10 DIAGNOSIS — D62 Acute posthemorrhagic anemia: Secondary | ICD-10-CM | POA: Diagnosis present

## 2017-09-10 DIAGNOSIS — Z881 Allergy status to other antibiotic agents status: Secondary | ICD-10-CM

## 2017-09-10 DIAGNOSIS — R7401 Elevation of levels of liver transaminase levels: Secondary | ICD-10-CM

## 2017-09-10 DIAGNOSIS — J15211 Pneumonia due to Methicillin susceptible Staphylococcus aureus: Secondary | ICD-10-CM | POA: Diagnosis not present

## 2017-09-10 DIAGNOSIS — Z880 Allergy status to penicillin: Secondary | ICD-10-CM | POA: Diagnosis not present

## 2017-09-10 DIAGNOSIS — R740 Nonspecific elevation of levels of transaminase and lactic acid dehydrogenase [LDH]: Secondary | ICD-10-CM | POA: Diagnosis present

## 2017-09-10 LAB — CREATININE, SERUM
Creatinine, Ser: 0.62 mg/dL (ref 0.44–1.00)
GFR calc Af Amer: >60 mL/min (ref >60)
GFR calc non Af Amer: >60 mL/min (ref >60)

## 2017-09-10 LAB — CULTURE, BLOOD (ROUTINE X 2)
CULTURE: NO GROWTH
CULTURE: NO GROWTH
SPECIAL REQUESTS: ADEQUATE
SPECIAL REQUESTS: ADEQUATE

## 2017-09-10 LAB — CBC
HEMATOCRIT: 32.9 % — AB (ref 36.0–46.0)
HEMOGLOBIN: 10.7 g/dL — AB (ref 12.0–15.0)
MCH: 28.3 pg (ref 26.0–34.0)
MCHC: 32.5 g/dL (ref 30.0–36.0)
MCV: 87 fL (ref 78.0–100.0)
Platelets: 269 10*3/uL (ref 150–400)
RBC: 3.78 MIL/uL — AB (ref 3.87–5.11)
RDW: 14 % (ref 11.5–15.5)
WBC: 9.7 10*3/uL (ref 4.0–10.5)

## 2017-09-10 LAB — GLUCOSE, CAPILLARY: Glucose-Capillary: 109 mg/dL — ABNORMAL HIGH (ref 65–99)

## 2017-09-10 MED ORDER — NITROGLYCERIN 0.4 MG SL SUBL: 0.4000 mg | SUBLINGUAL_TABLET | SUBLINGUAL | Status: DC | PRN

## 2017-09-10 MED ORDER — TRAZODONE HCL 50 MG PO TABS
50.0000 mg | ORAL_TABLET | Freq: Every evening | ORAL | Status: DC | PRN
  Administered 2017-09-10 – 2017-09-13 (×4): 50 mg via ORAL
  Filled 2017-09-10 (×4): qty 1

## 2017-09-10 MED ORDER — GUAIFENESIN-DM 100-10 MG/5ML PO SYRP: 5.0000 mL | ORAL_SOLUTION | ORAL | 0 refills | Status: DC | PRN

## 2017-09-10 MED ORDER — CARVEDILOL 3.125 MG PO TABS
3.1250 mg | ORAL_TABLET | Freq: Two times a day (BID) | ORAL | Status: DC
  Administered 2017-09-10: 3.125 mg via ORAL
  Filled 2017-09-10: qty 1

## 2017-09-10 MED ORDER — DOXYCYCLINE HYCLATE 100 MG PO TABS
100.0000 mg | ORAL_TABLET | Freq: Two times a day (BID) | ORAL | Status: DC
  Administered 2017-09-10 – 2017-09-14 (×8): 100 mg via ORAL
  Filled 2017-09-10 (×8): qty 1

## 2017-09-10 MED ORDER — NITROGLYCERIN 0.4 MG SL SUBL: 0.4000 mg | SUBLINGUAL_TABLET | SUBLINGUAL | 12 refills | Status: DC | PRN

## 2017-09-10 MED ORDER — LOSARTAN POTASSIUM 25 MG PO TABS: 12.5000 mg | ORAL_TABLET | Freq: Every day | ORAL | 1 refills | Status: DC

## 2017-09-10 MED ORDER — FUROSEMIDE 40 MG PO TABS
40.0000 mg | ORAL_TABLET | Freq: Every day | ORAL | Status: DC
  Administered 2017-09-11 – 2017-09-14 (×4): 40 mg via ORAL
  Filled 2017-09-10 (×4): qty 1

## 2017-09-10 MED ORDER — IPRATROPIUM-ALBUTEROL 0.5-2.5 (3) MG/3ML IN SOLN: 3.0000 mL | RESPIRATORY_TRACT | Status: DC | PRN

## 2017-09-10 MED ORDER — POTASSIUM CHLORIDE CRYS ER 10 MEQ PO TBCR: 10.0000 meq | EXTENDED_RELEASE_TABLET | Freq: Two times a day (BID) | ORAL | 1 refills | Status: DC

## 2017-09-10 MED ORDER — DOXYCYCLINE HYCLATE 100 MG PO TABS: 100.0000 mg | ORAL_TABLET | Freq: Two times a day (BID) | ORAL | 0 refills | Status: DC

## 2017-09-10 MED ORDER — FUROSEMIDE 40 MG PO TABS: 40.0000 mg | ORAL_TABLET | Freq: Every day | ORAL | 3 refills | Status: DC

## 2017-09-10 MED ORDER — GUAIFENESIN-DM 100-10 MG/5ML PO SYRP
10.0000 mL | ORAL_SOLUTION | Freq: Four times a day (QID) | ORAL | Status: DC | PRN
  Administered 2017-09-10: 10 mL via ORAL
  Filled 2017-09-10: qty 10

## 2017-09-10 MED ORDER — LOSARTAN POTASSIUM 25 MG PO TABS
12.5000 mg | ORAL_TABLET | Freq: Every day | ORAL | Status: DC
  Administered 2017-09-10: 12.5 mg via ORAL
  Filled 2017-09-10: qty 1

## 2017-09-10 MED ORDER — CARVEDILOL 3.125 MG PO TABS: 3.1250 mg | ORAL_TABLET | Freq: Two times a day (BID) | ORAL | 11 refills | Status: DC

## 2017-09-10 MED ORDER — SORBITOL 70 % SOLN
30.0000 mL | Freq: Every day | Status: DC | PRN
  Administered 2017-09-11 – 2017-09-12 (×2): 30 mL via ORAL
  Filled 2017-09-10 (×2): qty 30

## 2017-09-10 MED ORDER — ATORVASTATIN CALCIUM 80 MG PO TABS: 80.0000 mg | ORAL_TABLET | Freq: Every day | ORAL | 11 refills | Status: DC

## 2017-09-10 MED ORDER — POTASSIUM CHLORIDE CRYS ER 20 MEQ PO TBCR
20.0000 meq | EXTENDED_RELEASE_TABLET | Freq: Two times a day (BID) | ORAL | Status: DC
  Administered 2017-09-10 – 2017-09-14 (×8): 20 meq via ORAL
  Filled 2017-09-10 (×8): qty 1

## 2017-09-10 MED ORDER — ONDANSETRON HCL 4 MG PO TABS: 4.0000 mg | ORAL_TABLET | Freq: Four times a day (QID) | ORAL | Status: DC | PRN

## 2017-09-10 MED ORDER — CARVEDILOL 3.125 MG PO TABS
3.1250 mg | ORAL_TABLET | Freq: Two times a day (BID) | ORAL | Status: DC
  Administered 2017-09-10 – 2017-09-14 (×8): 3.125 mg via ORAL
  Filled 2017-09-10 (×8): qty 1

## 2017-09-10 MED ORDER — HEPARIN SODIUM (PORCINE) 5000 UNIT/ML IJ SOLN
5000.0000 [IU] | Freq: Three times a day (TID) | INTRAMUSCULAR | Status: DC
  Administered 2017-09-10 – 2017-09-14 (×11): 5000 [IU] via SUBCUTANEOUS
  Filled 2017-09-10 (×11): qty 1

## 2017-09-10 MED ORDER — ONDANSETRON HCL 4 MG/2ML IJ SOLN: 4.0000 mg | Freq: Four times a day (QID) | INTRAMUSCULAR | Status: DC | PRN

## 2017-09-10 MED ORDER — HEPARIN SODIUM (PORCINE) 5000 UNIT/ML IJ SOLN: 5000.0000 [IU] | Freq: Three times a day (TID) | INTRAMUSCULAR | Status: DC

## 2017-09-10 MED ORDER — BENZONATATE 100 MG PO CAPS
100.0000 mg | ORAL_CAPSULE | Freq: Three times a day (TID) | ORAL | Status: DC | PRN
  Administered 2017-09-10: 100 mg via ORAL
  Filled 2017-09-10: qty 1

## 2017-09-10 MED ORDER — IPRATROPIUM-ALBUTEROL 0.5-2.5 (3) MG/3ML IN SOLN: 3.0000 mL | RESPIRATORY_TRACT | 0 refills | Status: DC | PRN

## 2017-09-10 MED ORDER — BENZONATATE 100 MG PO CAPS: 100.0000 mg | ORAL_CAPSULE | Freq: Three times a day (TID) | ORAL | 0 refills | Status: DC | PRN

## 2017-09-10 MED ORDER — ASPIRIN EC 81 MG PO TBEC
81.0000 mg | DELAYED_RELEASE_TABLET | Freq: Every day | ORAL | Status: DC
  Administered 2017-09-11 – 2017-09-14 (×4): 81 mg via ORAL
  Filled 2017-09-10 (×4): qty 1

## 2017-09-10 MED ORDER — ATORVASTATIN CALCIUM 80 MG PO TABS
80.0000 mg | ORAL_TABLET | Freq: Every day | ORAL | Status: DC
  Administered 2017-09-10 – 2017-09-12 (×3): 80 mg via ORAL
  Filled 2017-09-10 (×3): qty 1

## 2017-09-10 MED ORDER — LOSARTAN POTASSIUM 25 MG PO TABS
12.5000 mg | ORAL_TABLET | Freq: Every day | ORAL | Status: DC
  Administered 2017-09-11 – 2017-09-14 (×4): 12.5 mg via ORAL
  Filled 2017-09-10 (×4): qty 1

## 2017-09-10 NOTE — H&P (Signed)
Physical Medicine and Rehabilitation Admission H&P    Chief Complaint  Patient presents with  . Chest Pain  : HPI: Brenda Lloyd is a 66 y.o. right handed female with history of hypertension, mitral valve prolapse. Per chart review, patient, and family, patient lives with spouse independent prior to admission one level home with 3 steps to entry. Presented 09/04/2017 with intermittent chest pain over a 5 day without radiation as well as shortness of breath and nausea. There was some concern of possible seizure with EEG completed that was negative Troponin elevated 3.15. EKG normal sinus rhythm with Q waves anteriorly, mild ST elevation. Oxygen saturations dipping into the 80s. Cardiac catheterization showed distal LAD lesion, 65% stenosed area and diffuse tapering from the mid vessel distally around the apex. The vessel had appearance of potentially diffuse intramural thrombus. Moderate to severe left ventricular systolic dysfunction. Ejection fraction 25-35%. Findings most consistent with takotsubo cardiomyopathy. Hospital course complicated by intubation for respiratory failure. A follow-up echocardiogram was completed showing ejection fraction of 35% no evidence of thrombus. Current medical management with aspirin. She was found to have a seizure and started on Keppra and later discontinued due to somnolence. There was discussion of possible need for MRI of the brain the patient back to baseline and discontinued. Patient was staph aureus tracheobronchitis currently maintained on antibiotic coverage. She was extubated 09/06/2017. Physical and occupational therapy evaluations 09/08/2017 for deconditioning/impulsivity with recommendations of physical medicine rehabilitation consult.Patient was admitted for a comprehensive rehabilitation program.  Review of Systems  Constitutional: Negative for chills and fever.  HENT: Negative for hearing loss.   Eyes: Negative for blurred vision and double  vision.  Respiratory: Positive for shortness of breath.   Cardiovascular: Negative for palpitations.  Gastrointestinal: Positive for constipation.  Genitourinary: Negative for dysuria, flank pain and hematuria.  Musculoskeletal: Positive for myalgias.  Skin: Negative for rash.  Neurological: Positive for headaches.  All other systems reviewed and are negative.  Past Medical History:  Diagnosis Date  . Hypertension    Past Surgical History:  Procedure Laterality Date  . LEFT HEART CATH AND CORONARY ANGIOGRAPHY N/A 09/05/2017   Procedure: LEFT HEART CATH AND CORONARY ANGIOGRAPHY;  Surgeon: Leonie Man, MD;  Location: [facility];  Service: Cardiovascular;  Laterality: N/A;   No family history on file. Social History:  reports that she has never smoked. She has never used smokeless tobacco. She reports that she drinks alcohol. She reports that she does not use drugs. Allergies:  Allergies  Allergen Reactions  . Amoxicillin Anaphylaxis  . Ciprofloxacin Anaphylaxis  . Fluocinolone Rash    Reported by [facility] 12/09/11 - unknown to spouse  . Penicillins Anaphylaxis    Has patient had a PCN reaction causing immediate rash, facial/tongue/throat swelling, SOB or lightheadedness with hypotension: Yes Has patient had a PCN reaction causing severe rash involving mucus membranes or skin necrosis: No Has patient had a PCN reaction that required hospitalization: No Has patient had a PCN reaction occurring within the last 10 years: No If all of the above answers are "NO", then may proceed with Cephalosporin use.  . Sulfa Antibiotics Anaphylaxis  . Influenza Vaccines Swelling    Caused arm swelling   Medications Prior to Admission  Medication Sig Dispense Refill  . acetaminophen (TYLENOL) 500 MG tablet Take 1,000 mg by mouth every 6 (six) hours as needed (pain).    Marland Kitchen aspirin EC 81 MG tablet Take 81 mg by mouth daily.    Marland Kitchen  ibuprofen (ADVIL,MOTRIN) 200 MG tablet Take 400 mg by mouth  every 6 (six) hours as needed for headache (pain).      Drug Regimen Review Drug regimen was reviewed and remains appropriately with no significant issues identified  Home: Home Living Family/patient expects to be discharged to:: Private residence Living Arrangements: Spouse/significant other Available Help at Discharge: Family, Available PRN/intermittently Type of Home: House Home Access: Stairs to enter Technical brewer of Steps: 3 Entrance Stairs-Rails: None Home Layout: One level Bathroom Shower/Tub: Chiropodist: Standard Home Equipment: None   Functional History: Prior Function Level of Independence: Independent Comments: Caring for 66yo and toddler grandsons  Functional Status:  Mobility: Bed Mobility Overal bed mobility: Needs Assistance Bed Mobility: Supine to Sit Rolling: Min assist Sidelying to sit: Min assist Supine to sit: HOB elevated, Supervision General bed mobility comments: pt up in recliner upon arrival Transfers Overall transfer level: Needs assistance Equipment used: 1 person hand held assist, Rolling walker (2 wheeled) Transfers: Sit to/from Stand Sit to Stand: Min assist General transfer comment: used RW to ambulate to bathroom, cues for correct hand placement Ambulation/Gait Ambulation/Gait assistance: Min assist, Mod assist Ambulation Distance (Feet): 160 Feet Assistive device: None Gait Pattern/deviations: Step-through pattern, Decreased stride length, Drifts right/left, Shuffle General Gait Details: assist for balance up to mod A at times due to unsteady and declined to use walker.  SpO2 during ambulation on 2L O2 maintained above 90%; on RA SpO2 droppted to 83% and pt reported SOB/acvitity intolerance    ADL: ADL Overall ADL's : Needs assistance/impaired Grooming: Wash/dry hands, Wash/dry face, Min guard, Standing Upper Body Bathing: Min guard, Standing Lower Body Bathing: Moderate assistance Upper Body  Dressing : Min guard, Standing Lower Body Dressing: Moderate assistance Toilet Transfer: Moderate assistance Toileting- Clothing Manipulation and Hygiene: Min guard Functional mobility during ADLs: Minimal assistance, Cueing for safety, Rolling walker  Cognition: Cognition Overall Cognitive Status: Impaired/Different from baseline Orientation Level: Oriented to person, Oriented to place, Oriented to situation, Disoriented to time Cognition Arousal/Alertness: Awake/alert Behavior During Therapy: WFL for tasks assessed/performed Overall Cognitive Status: Impaired/Different from baseline Area of Impairment: Safety/judgement Safety/Judgement: Decreased awareness of deficits, Decreased awareness of safety  Physical Exam: Blood pressure 127/74, pulse 76, temperature 98 F (36.7 C), temperature source Oral, resp. rate (!) 23, height '5\' 9"'  (1.753 m), weight 83.1 kg (183 lb 4.8 oz), SpO2 95 %. Physical Exam  Vitals reviewed. Constitutional: She is oriented to person, place, and time. She appears well-developed and well-nourished.  HENT:  Head: Normocephalic and atraumatic.  Eyes: EOM are normal. Right eye exhibits no discharge. Left eye exhibits no discharge.  Neck: Normal range of motion. Neck supple. No thyromegaly present.  Cardiovascular: Normal rate and regular rhythm.   Respiratory: Effort normal and breath sounds normal. No respiratory distress. She has no wheezes.  GI: Soft. Bowel sounds are normal. She exhibits no distension.  Musculoskeletal: She exhibits no edema or tenderness.  Neurological: She is alert and oriented to person, place, and time.  Motor: 4-4+/5 grossly throughout Sensation intact to light touch  Skin: Skin is warm and dry.  Right neck dressing c/d/i  Psychiatric: She has a normal mood and affect. Her behavior is normal. Judgment and thought content normal.     Results for orders placed or performed during the hospital encounter of 09/04/17 (from the past 48  hour(s))  Vancomycin, trough     Status: Abnormal   Collection Time: 09/08/17  7:07 PM  Result Value Ref Range  Vancomycin Tr 8 (L) 15 - 20 ug/mL  Basic metabolic panel     Status: Abnormal   Collection Time: 09/09/17  2:19 AM  Result Value Ref Range   Sodium 141 135 - 145 mmol/L   Potassium 3.5 3.5 - 5.1 mmol/L   Chloride 106 101 - 111 mmol/L   CO2 29 22 - 32 mmol/L   Glucose, Bld 111 (H) 65 - 99 mg/dL   BUN 7 6 - 20 mg/dL   Creatinine, Ser 0.57 0.44 - 1.00 mg/dL   Calcium 8.4 (L) 8.9 - 10.3 mg/dL   GFR calc non Af Amer >60 >60 mL/min   GFR calc Af Amer >60 >60 mL/min    Comment: (NOTE) The eGFR has been calculated using the CKD EPI equation. This calculation has not been validated in all clinical situations. eGFR's persistently <60 mL/min signify possible Chronic Kidney Disease.    Anion gap 6 5 - 15  Glucose, capillary     Status: Abnormal   Collection Time: 09/09/17  7:20 AM  Result Value Ref Range   Glucose-Capillary 108 (H) 65 - 99 mg/dL  Glucose, capillary     Status: Abnormal   Collection Time: 09/10/17  7:21 AM  Result Value Ref Range   Glucose-Capillary 109 (H) 65 - 99 mg/dL   No results found.     Medical Problem List and Plan: 1.  Deconditioning secondary to Takotsubo cardiomyopathy/cardiogenic shock and respiratory failure with aspiration pneumonia 2.  DVT Prophylaxis/Anticoagulation: Subcutaneous heparin. Monitor platelet counts and any signs of bleeding 3. Pain Management: Tylenol as needed 4. Mood: Provide emotional support 5. Neuropsych: This patient is capable of making decisions on her own behalf. 6. Skin/Wound Care: Routine skin checks 7. Fluids/Electrolytes/Nutrition: Routine I&O with follow-up chemistries 8. Hypertension. Lasix 40 mg daily, Coreg 3.125 mg twice a day, Cozaar 12.5 g daily. Monitor with increased mobility 9.Acute systolic congestive heart failure. Continue Lasix therapy. No signs of fluid overload, 10. Staph aureus  Tracheobronchitis. Complete course of doxycycline 11. Hyperlipidemia. Lipitor 12. Leukocytosis: Follow CBC.   Post Admission Physician Evaluation: 1. Preadmission assessment reviewed and changes made below. 2. Functional deficits secondary  to debility. 3. Patient is admitted to receive collaborative, interdisciplinary care between the physiatrist, rehab nursing staff, and therapy team. 4. Patient's level of medical complexity and substantial therapy needs in context of that medical necessity cannot be provided at a lesser intensity of care such as a SNF. 5. Patient has experienced substantial functional loss from his/her baseline which was documented above under the "Functional History" and "Functional Status" headings.  Judging by the patient's diagnosis, physical exam, and functional history, the patient has potential for functional progress which will result in measurable gains while on inpatient rehab.  These gains will be of substantial and practical use upon discharge  in facilitating mobility and self-care at the household level. 32. Physiatrist will provide 24 hour management of medical needs as well as oversight of the therapy plan/treatment and provide guidance as appropriate regarding the interaction of the two. 7. 24 hour rehab nursing will assist with safety, disease management and patient education  and help integrate therapy concepts, techniques,education, etc. 8. PT will assess and treat for/with: Lower extremity strength, range of motion, stamina, balance, functional mobility, safety, adaptive techniques and equipment, woundcare, coping skills, pain control, education.   Goals are: Mod I. 9. OT will assess and treat for/with: ADL's, functional mobility, safety, upper extremity strength, adaptive techniques and equipment, wound mgt, ego support, and community  reintegration.   Goals are: Mod I. Therapy may proceed with showering this patient. 10. Case Management and Social Worker will  assess and treat for psychological issues and discharge planning. 11. Team conference will be held weekly to assess progress toward goals and to determine barriers to discharge. 12. Patient will receive at least 3 hours of therapy per day at least 5 days per week. 13. ELOS: 5-8 days.       14. Prognosis:  excellent  Delice Lesch, MD, Mellody Drown Lauraine Rinne J., PA-C 09/10/2017

## 2017-09-10 NOTE — Progress Notes (Signed)
Ranelle Oyster, MD Physician Signed Physical Medicine and Rehabilitation  Consult Note Date of Service: 09/09/2017 6:00 AM  Related encounter: ED to Hosp-Admission (Discharged) from 09/04/2017 in [facility]     Expand All Collapse All   Hide copied text Hover for attribution information      Physical Medicine and Rehabilitation Consult Reason for Consult: Decreased functional mobility related to stress-induced cardiomyopathy with respiratory failure Referring Physician: [facility]   HPI: Brenda Lloyd is a 66 y.o. right handed female with history of hypertension, mitral valve prolapse. Per chart review patient lives with spouse independent prior to admission one level home with 3 steps to entry. Presented 09/04/2017 with intermittent chest pain over a 5 day without radiation as well as shortness of breath and nausea. There was some concern of possible seizure with EEG completed that was negative Troponin elevated 3.15. EKG normal sinus rhythm with Q waves anteriorly, mild ST elevation. Oxygen saturations dipping into the 80s. Cardiac catheterization showed distal LAD lesion, 65% stenosed area and diffuse tapering from the mid vessel distally around the apex. The vessel had appearance of potentially diffuse intramural thrombus. Moderate to severe left ventricular systolic dysfunction. Ejection fraction 25-35%. Findings most consistent with takotsubo cardiomyopathy. Hospital course patient had required intubation for respiratory failure. A follow-up echocardiogram was completed showing ejection fraction of 35% no evidence of thrombus. Current medical management with aspirin. Patient was staph aureus tracheobronchitis currently maintained on antibiotic coverage. She was extubated 09/06/2017. Physical therapy evaluation 09/08/2017 for deconditioning/impulsivity with recommendations of physical medicine rehabilitation consult.   Review of Systems    Constitutional: Negative for chills and fever.  HENT: Negative for hearing loss.   Eyes: Negative for blurred vision and double vision.  Respiratory: Positive for shortness of breath. Negative for cough.   Cardiovascular: Positive for chest pain. Negative for leg swelling.  Gastrointestinal: Positive for constipation and nausea. Negative for vomiting.  Genitourinary: Negative for dysuria, flank pain and hematuria.  Musculoskeletal: Positive for myalgias.  Skin: Negative for rash.  All other systems reviewed and are negative.      Past Medical History:  Diagnosis Date  . Hypertension         Past Surgical History:  Procedure Laterality Date  . LEFT HEART CATH AND CORONARY ANGIOGRAPHY N/A 09/05/2017   Procedure: LEFT HEART CATH AND CORONARY ANGIOGRAPHY;  Surgeon: [name], MD;  Location: [facility] INVASIVE CV LAB;  Service: Cardiovascular;  Laterality: N/A;   No family history on file. Social History:  reports that she has never smoked. She has never used smokeless tobacco. She reports that she drinks alcohol. She reports that she does not use drugs. Allergies:       Allergies  Allergen Reactions  . Amoxicillin Anaphylaxis  . Ciprofloxacin Anaphylaxis  . Fluocinolone Rash    Reported by [facility] 12/09/11 - unknown to spouse  . Penicillins Anaphylaxis    Has patient had a PCN reaction causing immediate rash, facial/tongue/throat swelling, SOB or lightheadedness with hypotension: Yes Has patient had a PCN reaction causing severe rash involving mucus membranes or skin necrosis: No Has patient had a PCN reaction that required hospitalization: No Has patient had a PCN reaction occurring within the last 10 years: No If all of the above answers are "NO", then may proceed with Cephalosporin use.  . Sulfa Antibiotics Anaphylaxis  . Influenza Vaccines Swelling    Caused arm swelling         Medications Prior to Admission  Medication Sig Dispense Refill  . acetaminophen  (TYLENOL) 500 MG tablet Take 1,000 mg by mouth every 6 (six) hours as needed (pain).    Marland Kitchen aspirin EC 81 MG tablet Take 81 mg by mouth daily.    Marland Kitchen ibuprofen (ADVIL,MOTRIN) 200 MG tablet Take 400 mg by mouth every 6 (six) hours as needed for headache (pain).      Home: Home Living Family/patient expects to be discharged to:: Private residence Living Arrangements: Spouse/significant other Available Help at Discharge: Family, Available PRN/intermittently (near 24 hour) Type of Home: House Home Access: Stairs to enter Secretary/administrator of Steps: 3 Entrance Stairs-Rails: None Home Layout: One level Bathroom Shower/Tub:  (tba) Home Equipment: None  Functional History: Prior Function Level of Independence: Independent Comments: Caring for 66yo and toddler grandsons Functional Status:  Mobility: Bed Mobility Overal bed mobility: Needs Assistance Bed Mobility: Rolling, Sidelying to Sit Rolling: Min assist Sidelying to sit: Min assist General bed mobility comments: Cues for technique; min assist mostly for managing lines Transfers Overall transfer level: Needs assistance Equipment used: 1 person hand held assist Transfers: Sit to/from Stand Sit to Stand: Min assist General transfer comment: Cues for hand placement and to self-monitor for activity tolerance; Impulsively stood; noted decr control with stand to sit Ambulation/Gait Ambulation/Gait assistance: Min assist Ambulation Distance (Feet):  (Pivotal steps bed to chair) Assistive device: 1 person hand held assist Gait Pattern/deviations: Shuffle General Gait Details: min assist to steady  ADL:  Cognition: Cognition Overall Cognitive Status: Impaired/Different from baseline Orientation Level: Oriented to person, Oriented to place, Oriented to situation, Disoriented to time Cognition Arousal/Alertness: Awake/alert Behavior During Therapy: WFL for tasks assessed/performed Overall Cognitive Status:  Impaired/Different from baseline Area of Impairment: Safety/judgement Safety/Judgement: Decreased awareness of safety, Decreased awareness of deficits  Blood pressure 122/75, pulse 77, temperature 97.8 F (36.6 C), temperature source Oral, resp. rate 19, height  (1.753 m), weight 85.1 kg (187 lb 11.2 oz), SpO2 96 %. Physical Exam  Constitutional: She appears well-developed.  HENT:  Head: Normocephalic.  Eyes: EOM are normal.  Neck: Normal range of motion. Neck supple. No thyromegaly present.  Cardiovascular: Normal rate and regular rhythm.   Respiratory: Effort normal and breath sounds normal. No respiratory distress.  No dyspnea with conversation  GI: Soft. Bowel sounds are normal. She exhibits no distension.  Neurological: She is alert.  Patient is a bit anxious. She does follow commands. Provides her name. She does have some limited recall over her hospital stay. UE motor 4/5 prox to distal. LE: 3+HF to 4/5 ADF/PF. No sensory deficits.   Skin: Skin is warm and dry.  Psychiatric: She has a normal mood and affect. Her behavior is normal.    Lab Results Last 24 Hours       Results for orders placed or performed during the hospital encounter of 09/04/17 (from the past 24 hour(s))  Glucose, capillary     Status: Abnormal   Collection Time: 09/08/17  7:40 AM  Result Value Ref Range   Glucose-Capillary 101 (H) 65 - 99 mg/dL  Vancomycin, trough     Status: Abnormal   Collection Time: 09/08/17  7:07 PM  Result Value Ref Range   Vancomycin Tr 8 (L) 15 - 20 ug/mL  Basic metabolic panel     Status: Abnormal   Collection Time: 09/09/17  2:19 AM  Result Value Ref Range   Sodium 141 135 - 145 mmol/L   Potassium 3.5 3.5 - 5.1 mmol/L   Chloride 106 101 -  111 mmol/L   CO2 29 22 - 32 mmol/L   Glucose, Bld 111 (H) 65 - 99 mg/dL   BUN 7 6 - 20 mg/dL   Creatinine, Ser 6.96 0.44 - 1.00 mg/dL   Calcium 8.4 (L) 8.9 - 10.3 mg/dL   GFR calc non Af Amer >60 >60 mL/min   GFR  calc Af Amer >60 >60 mL/min   Anion gap 6 5 - 15     Imaging Results (Last 48 hours)  No results found.    Assessment/Plan: Diagnosis: profound deconditioning related to Takotsubo cardiomyopathy/cardiogenic shock and respiratory failure/aspiration pneumonia 1. Does the need for close, 24 hr/day medical supervision in concert with the patient's rehab needs make it unreasonable for this patient to be served in a less intensive setting? Yes 2. Co-Morbidities requiring supervision/potential complications: tracheobronchitis, MR, hypokalemia, CAD, recent seizure 3. Due to bladder management, bowel management, safety, skin/wound care, disease management, medication administration, pain management and patient education, does the patient require 24 hr/day rehab nursing? Yes 4. Does the patient require coordinated care of a physician, rehab nurse, PT (1-2 hrs/day, 5 days/week) and OT (1-2 hrs/day, 5 days/week) to address physical and functional deficits in the context of the above medical diagnosis(es)? Yes Addressing deficits in the following areas: balance, endurance, locomotion, strength, transferring, bowel/bladder control, bathing, dressing, feeding, grooming, toileting and psychosocial support 5. Can the patient actively participate in an intensive therapy program of at least 3 hrs of therapy per day at least 5 days per week? Yes 6. The potential for patient to make measurable gains while on inpatient rehab is excellent 7. Anticipated functional outcomes upon discharge from inpatient rehab are modified independent  with PT, modified independent with OT, n/a with SLP. 8. Estimated rehab length of stay to reach the above functional goals is: 7 days 9. Anticipated D/C setting: Home 10. Anticipated post D/C treatments: HH therapy 11. Overall Rehab/Functional Prognosis: excellent  RECOMMENDATIONS: This patient's condition is appropriate for continued rehabilitative care in the following setting:  CIR Patient has agreed to participate in recommended program. Yes Note that insurance prior authorization may be required for reimbursement for recommended care.  Comment: Pt was independent PTA and is motivated to improve her mobility/balance/stamina. Inpatient rehab is an ideal environment to push her functionally while at the same time closely monitoring her cardiac needs. Rehab Admissions Coordinator to follow up.  Thanks,  Ranelle Oyster, MD, [place] [facility]    Charlton Amor., PA-C 09/09/2017    Revision History              Routing History

## 2017-09-10 NOTE — Progress Notes (Signed)
Cristina Gong, RN Rehab Admission Coordinator Signed Physical Medicine and Rehabilitation  PMR Pre-admission Date of Service: 09/10/2017 11:43 AM  Related encounter: ED to Hosp-Admission (Discharged) from 09/04/2017 in [facility]       '[]' Hide copied text PMR Admission Coordinator Pre-Admission Assessment  Patient: Brenda Lloyd is an 66 y.o., female MRN: [id number] DOB: [birth year] Height: '5\' 9"'  (175.3 cm) Weight: 83.1 kg (183 lb 4.8 oz)                                                                                                                                                  Insurance Information HMO:    PPO: yes     PCP:      IPA:      80/20:      OTHER: medicare advantage plan PRIMARY: [place]    Policy#: [id number]      Subscriber: pt CM Name: Orvan July      Phone#: [phone]     Fax#: [phone] Pre-Cert#: [id number] approved for 7 days with f/u with Vevelyn Royals phone [phone] fax [phone]      Employer: retired Benefits:  Phone #: [phone]     Name: 09/09/17 Eff. Date: 07/28/17     Deduct: none      Out of Pocket Max: $3300      Life Max: none CIR: $150 co pay per day days 1-10 then covers 100%      SNF: no co pay days 1-20: $50 co pay per day days 21-100  Outpatient: $20 co pay per visit     Co-Pay: visits per medical neccesity Home Health: 100%      Co-Pay: visits per medical neccesity DME: 80%     Co-Pay: 20% Providers: in network  SECONDARY: none       Medicaid Application Date:       Case Manager:  Disability Application Date:       Case Worker:   Emergency Tax adviser Information    Name Relation Home Work Mobile   Adams Spouse   [phone]     Current Medical History  Patient Admitting Diagnosis: debility  History of Present Illness: Brenda Connon Dillonis a 66 y.o.right handed femalewith history of hypertension, mitral valve prolapse.  Presented  09/04/2017 with intermittent chest pain over a 5 day without radiation as well as shortness of breath and nausea. There was some concern of possible seizure with EEG completed that was negative Troponin elevated 3.15. EKG normal sinus rhythm with Q waves anteriorly, mild ST elevation. Oxygen saturations dipping into the 80s. Cardiac catheterization showed distal LAD lesion, 65% stenosed area and diffuse tapering from the mid vessel distally around the apex. The vessel had appearance of potentially diffuse intramural thrombus. Moderate to severe left  ventricular systolic dysfunction. Ejection fraction 25-35%. Findings most consistent with takotsubocardiomyopathy. Hospital course complicated by intubation for respiratory failure. A follow-up echocardiogram was completed showing ejection fraction of 35% no evidence of thrombus. Current medical management with aspirin.Patient with some increased somnolence after cardiac catheterization. She was found to have a seizure and started on Keppra. Patient was staph aureus tracheobronchitis currently maintained on antibiotic coverage. She was extubated 09/06/2017.   Past Medical History      Past Medical History:  Diagnosis Date  . Hypertension     Family History  family history is not on file.  Prior Rehab/Hospitalizations:  Has the patient had major surgery during 100 days prior to admission? No  Current Medications   Current Facility-Administered Medications:  .  0.9 %  sodium chloride infusion, 250 mL, Intravenous, PRN, Leonie Man, MD, Last Rate: 10 mL/hr at 09/07/17 2200, 250 mL at 09/07/17 2200 .  acetaminophen (TYLENOL) suppository 650 mg, 650 mg, Rectal, Q6H PRN, Anders Simmonds, MD, 650 mg at 09/05/17 1804 .  aspirin EC tablet 81 mg, 81 mg, Oral, Daily, Geralynn Ochs T, MD, 81 mg at 09/10/17 1050 .  atorvastatin (LIPITOR) tablet 80 mg, 80 mg, Oral, q1800, Geralynn Ochs T, MD, 80 mg at 09/09/17 1935 .  benzonatate (TESSALON)  capsule 100 mg, 100 mg, Oral, TID PRN, Croitoru, Mihai, MD .  carvedilol (COREG) tablet 3.125 mg, 3.125 mg, Oral, BID WC, Duke, Tami Lin, PA, 3.125 mg at 09/10/17 1054 .  chlorhexidine gluconate (MEDLINE KIT) (PERIDEX) 0.12 % solution 15 mL, 15 mL, Mouth Rinse, BID, Leonie Man, MD, 15 mL at 09/09/17 1038 .  Chlorhexidine Gluconate Cloth 2 % PADS 6 each, 6 each, Topical, Daily, Aldean Jewett, MD, 6 each at 09/09/17 1000 .  doxycycline (VIBRA-TABS) tablet 100 mg, 100 mg, Oral, Q12H, Croitoru, Mihai, MD, 100 mg at 09/10/17 1050 .  furosemide (LASIX) tablet 40 mg, 40 mg, Oral, Daily, Croitoru, Mihai, MD, 40 mg at 09/10/17 1050 .  guaiFENesin-dextromethorphan (ROBITUSSIN DM) 100-10 MG/5ML syrup 5 mL, 5 mL, Oral, Q4H PRN, Ledora Bottcher, PA, 5 mL at 09/09/17 2130 .  heparin injection 5,000 Units, 5,000 Units, Subcutaneous, Q8H, Leonie Man, MD, 5,000 Units at 09/10/17 0555 .  ipratropium-albuterol (DUONEB) 0.5-2.5 (3) MG/3ML nebulizer solution 3 mL, 3 mL, Nebulization, PRN, Geralynn Ochs T, MD, 3 mL at 09/05/17 0518 .  losartan (COZAAR) tablet 12.5 mg, 12.5 mg, Oral, Daily, Duke, Tami Lin, [place] .  MEDLINE mouth rinse, 15 mL, Mouth Rinse, QID, Leonie Man, MD, 15 mL at 09/09/17 1253 .  nitroGLYCERIN (NITROSTAT) SL tablet 0.4 mg, 0.4 mg, Sublingual, Q5 min PRN, Charlesetta Shanks, MD, 0.4 mg at 09/05/17 0432 .  ondansetron (ZOFRAN) injection 4 mg, 4 mg, Intravenous, Q8H PRN, Duke, Tami Lin, PA, 4 mg at 09/09/17 2130 .  pneumococcal 23 valent vaccine (PNU-IMMUNE) injection 0.5 mL, 0.5 mL, Intramuscular, Tomorrow-1000, Allred, James, MD .  potassium chloride (K-DUR,KLOR-CON) CR tablet 10 mEq, 10 mEq, Oral, BID, Croitoru, Mihai, MD, 10 mEq at 09/10/17 1051 .  potassium chloride SA (K-DUR,KLOR-CON) CR tablet 20 mEq, 20 mEq, Oral, BID, Croitoru, Mihai, MD, 20 mEq at 09/10/17 1050 .  sodium chloride flush (NS) 0.9 % injection 10-40 mL, 10-40 mL, Intracatheter, Q12H, Aljishi, Virgina Norfolk, MD, 10 mL at 09/09/17 1000 .  sodium chloride flush (NS) 0.9 % injection 10-40 mL, 10-40 mL, Intracatheter, PRN, Aljishi, Wael Z, MD .  sodium chloride flush (NS) 0.9 % injection 3 mL,  3 mL, Intravenous, Q12H, Geralynn Ochs T, MD, 3 mL at 09/08/17 2128 .  sodium chloride flush (NS) 0.9 % injection 3 mL, 3 mL, Intravenous, Q12H, Leonie Man, MD, 3 mL at 09/10/17 1058 .  sodium chloride flush (NS) 0.9 % injection 3 mL, 3 mL, Intravenous, PRN, Leonie Man, MD  Patients Current Diet: Diet 2 gram sodium Room service appropriate? Yes; Fluid consistency: Thin  Precautions / Restrictions Precautions Precautions: Fall Precaution Comments: watch O2 sats Restrictions Weight Bearing Restrictions: No   Has the patient had 2 or more falls or a fall with injury in the past year?No  Prior Activity Level Community (5-7x/wk): active, caring for 24 year old grandson; runs; gradual decline in last 2 months. Also cares for 40 month old grandson through out the week. She has been exercising with her 65 year old grandson to assist him in losing weight but gradual decline in past 2 months due to endurance and SOB and chest pain  Home Assistive Devices / Equipment Home Assistive Devices/Equipment: Eyeglasses Home Equipment: None  Prior Device Use: Indicate devices/aids used by the patient prior to current illness, exacerbation or injury? None of the above  Prior Functional Level Prior Function Level of Independence: Independent Comments: has custody of 4 year old grandson; 47 month old during the day also  Self Care: Did the patient need help bathing, dressing, using the toilet or eating?  Independent  Indoor Mobility: Did the patient need assistance with walking from room to room (with or without device)? Independent  Stairs: Did the patient need assistance with internal or external stairs (with or without device)? Independent  Functional Cognition: Did the patient need help  planning regular tasks such as shopping or remembering to take medications? Independent  Current Functional Level Cognition  Overall Cognitive Status: Impaired/Different from baseline Orientation Level: Oriented to person, Oriented to place, Oriented to situation, Disoriented to time Safety/Judgement: Decreased awareness of deficits, Decreased awareness of safety    Extremity Assessment (includes Sensation/Coordination)  Upper Extremity Assessment: Generalized weakness  Lower Extremity Assessment: Defer to PT evaluation    ADLs  Overall ADL's : Needs assistance/impaired Grooming: Wash/dry hands, Wash/dry face, Min guard, Standing Upper Body Bathing: Min guard, Standing Lower Body Bathing: Moderate assistance Upper Body Dressing : Min guard, Standing Lower Body Dressing: Moderate assistance Toilet Transfer: Moderate assistance Toileting- Clothing Manipulation and Hygiene: Min guard Functional mobility during ADLs: Minimal assistance, Cueing for safety, Rolling walker    Mobility  Overal bed mobility: Needs Assistance Bed Mobility: Supine to Sit Rolling: Min assist Sidelying to sit: Min assist Supine to sit: HOB elevated, Supervision General bed mobility comments: pt up in recliner upon arrival    Transfers  Overall transfer level: Needs assistance Equipment used: 1 person hand held assist, Rolling walker (2 wheeled) Transfers: Sit to/from Stand Sit to Stand: Min assist General transfer comment: used RW to ambulate to bathroom, cues for correct hand placement    Ambulation / Gait / Stairs / Wheelchair Mobility  Ambulation/Gait Ambulation/Gait assistance: Min assist, Mod assist Ambulation Distance (Feet): 160 Feet Assistive device: None Gait Pattern/deviations: Step-through pattern, Decreased stride length, Drifts right/left, Shuffle General Gait Details: assist for balance up to mod A at times due to unsteady and declined to use walker.  SpO2 during ambulation on  2L O2 maintained above 90%; on RA SpO2 droppted to 83% and pt reported SOB/acvitity intolerance    Posture / Balance Balance Overall balance assessment: Needs assistance Sitting-balance support: Feet supported Sitting  balance-Leahy Scale: Good Standing balance support: No upper extremity supported Standing balance-Leahy Scale: Poor Standing balance comment: needs assistance in standing without UE support    Special needs/care consideration BiPAP/CPAP  N/a CPM  N/a Continuous Drip IV  N/a Dialysis  N/a Life Vest  N/a Oxygen 2 liters nasal cannula prn; not used pta Special Bed  N/a Trach Size  N/a Wound Vac n/a Skin  intact Bowel mgmt: LBM 9/13 some diarrhea Bladder mgmt: continent Diabetic mgmt n/a   Previous Home Environment Living Arrangements: Spouse/significant other  Lives With: Spouse (has full custody of 84 year old grandson) Available Help at Discharge: Family, Available 24 hours/day (spouse avaialble 24/7) Type of Home: House Home Layout: One level Home Access: Stairs to enter Entrance Stairs-Rails: None Entrance Stairs-Number of Steps: 3 Bathroom Shower/Tub: Public librarian, Architectural technologist: Standard Bathroom Accessibility: Yes How Accessible: Accessible via walker Home Care Services: No Additional Comments: endurance poor  Discharge Living Setting Plans for Discharge Living Setting: Patient's home, Lives with (comment) (spouse and 51 year old grandson) Type of Home at Discharge: House Discharge Home Layout: One level Discharge Home Access: Stairs to enter Entrance Stairs-Rails: None Entrance Stairs-Number of Steps: 3 Discharge Bathroom Shower/Tub: Tub/shower unit, Curtain Discharge Bathroom Toilet: Standard Discharge Bathroom Accessibility: Yes How Accessible: Accessible via walker Does the patient have any problems obtaining your medications?: No  Social/Family/Support Systems Patient Roles: Spouse, Parent, Caregiver Contact Information:  spouse Anticipated Caregiver: spouse Anticipated Caregiver's Contact Information: see above Ability/Limitations of Caregiver: none Caregiver Availability: 24/7 Discharge Plan Discussed with Primary Caregiver: Yes Is Caregiver In Agreement with Plan?: Yes Does Caregiver/Family have Issues with Lodging/Transportation while Pt is in Rehab?: No  Goals/Additional Needs Patient/Family Goal for Rehab: Mod I to supervision with PT and OT Expected length of stay: ELOS 7 days Pt/Family Agrees to Admission and willing to participate: Yes Program Orientation Provided & Reviewed with Pt/Caregiver Including Roles  & Responsibilities: Yes  Decrease burden of Care through IP rehab admission: n/a  Possible need for SNF placement upon discharge:not anticipated  Patient Condition: This patient's condition remains as documented in the consult dated 09/09/2017, in which the Rehabilitation Physician determined and documented that the patient's condition is appropriate for intensive rehabilitative care in an inpatient rehabilitation facility. Will admit to inpatient rehab today.  Preadmission Screen Completed By:  Cleatrice Burke, 09/10/2017 11:44 AM ______________________________________________________________________   Discussed status with Dr. Posey Pronto on 09/10/2017 at  1239 and received telephone approval for admission today.  Admission Coordinator:  Cleatrice Burke, time 2778 Date 09/10/2017       Cosigned by: Jamse Arn, MD at 09/10/2017 1:44 PM  Revision History

## 2017-09-10 NOTE — Progress Notes (Signed)
Progress Note  Patient Name: Brenda Lloyd Date of Encounter: 09/10/2017  Primary Cardiologist: new - Dr. Sallyanne Kuster  Subjective   Pt sitting up in bed. Cough is improving. She has no complaints. Was hesitant about going to inpatient rehab. She is now amenable to inpt rehab when bed is ready.  Inpatient Medications    Scheduled Meds: . aspirin EC  81 mg Oral Daily  . atorvastatin  80 mg Oral q1800  . chlorhexidine gluconate (MEDLINE KIT)  15 mL Mouth Rinse BID  . Chlorhexidine Gluconate Cloth  6 each Topical Daily  . doxycycline  100 mg Oral Q12H  . furosemide  40 mg Oral Daily  . heparin  5,000 Units Subcutaneous Q8H  . mouth rinse  15 mL Mouth Rinse QID  . pneumococcal 23 valent vaccine  0.5 mL Intramuscular Tomorrow-1000  . potassium chloride  10 mEq Oral BID  . potassium chloride  20 mEq Oral BID  . sodium chloride flush  10-40 mL Intracatheter Q12H  . sodium chloride flush  3 mL Intravenous Q12H  . sodium chloride flush  3 mL Intravenous Q12H   Continuous Infusions: . sodium chloride 250 mL (09/07/17 2200)   PRN Meds: sodium chloride, acetaminophen, guaiFENesin-dextromethorphan, ipratropium-albuterol, nitroGLYCERIN, ondansetron (ZOFRAN) IV, sodium chloride flush, sodium chloride flush   Vital Signs    Vitals:   09/09/17 1445 09/09/17 1702 09/09/17 1957 09/10/17 0506  BP:  134/71 (!) 120/55 127/74  Pulse: 78 85  76  Resp: 18 (!) 21  (!) 23  Temp: 98 F (36.7 C)  98.2 F (36.8 C) 98 F (36.7 C)  TempSrc: Oral  Oral Oral  SpO2: 98% 98%  95%  Weight:    183 lb 4.8 oz (83.1 kg)  Height:        Intake/Output Summary (Last 24 hours) at 09/10/17 7062 Last data filed at 09/09/17 2231  Gross per 24 hour  Intake              480 ml  Output              604 ml  Net             -124 ml   Filed Weights   09/08/17 0401 09/09/17 0445 09/10/17 0506  Weight: 195 lb 3.2 oz (88.5 kg) 187 lb 11.2 oz (85.1 kg) 183 lb 4.8 oz (83.1 kg)     Physical Exam    General: Well developed, well nourished, female appearing in no acute distress. Head: Normocephalic, atraumatic.  Neck: Supple without bruits, no JVD. Lungs:  Resp regular and unlabored, CTA. With crackles in right base Heart: RRR, S1, S2, no S3, S4, soft systolic murmur; no rub. Abdomen: Soft, non-tender, non-distended with normoactive bowel sounds. No hepatomegaly. No rebound/guarding. No obvious abdominal masses. Extremities: No clubbing, cyanosis, trace edema R>L. Distal pedal pulses are 2+ bilaterally. Neuro: Alert and oriented X 3. Moves all extremities spontaneously. Psych: Normal affect.  Labs    Chemistry Recent Labs Lab 09/04/17 1500 09/05/17 0602  09/07/17 0406 09/08/17 0326 09/09/17 0219  NA 140 139  < > 135 139 141  K 3.6 3.5  < > 3.5 3.2* 3.5  CL 105 107  < > 103 106 106  CO2 25 19*  < > '23 29 29  ' GLUCOSE 154* 154*  < > 113* 111* 111*  BUN 12 8  < > '9 6 7  ' CREATININE 0.70 0.61  < > 0.55 0.53 0.57  CALCIUM 9.7 8.8*  < >  8.5* 8.2* 8.4*  PROT 7.7 7.0  --   --   --   --   ALBUMIN 4.1 3.8  --  2.8* 2.7*  --   AST 27 38  --   --   --   --   ALT 33 31  --   --   --   --   ALKPHOS 82 85  --   --   --   --   BILITOT 0.8 1.5*  --   --   --   --   GFRNONAA >60 >60  < > >60 >60 >60  GFRAA >60 >60  < > >60 >60 >60  ANIONGAP 10 13  < > 9 4* 6  < > = values in this interval not displayed.   Hematology Recent Labs Lab 09/06/17 0405 09/07/17 0406 09/08/17 0326  WBC 19.7* 15.0* 11.6*  RBC 4.17 3.91 3.63*  HGB 11.6* 11.2* 10.1*  HCT 36.3 34.2* 31.6*  MCV 87.1 87.5 87.1  MCH 27.8 28.6 27.8  MCHC 32.0 32.7 32.0  RDW 14.0 14.1 13.7  PLT 318 202 216    Cardiac Enzymes Recent Labs Lab 09/04/17 1857 09/04/17 2105 09/05/17 0202 09/05/17 0602  TROPONINI 3.15* 2.35* 3.67* 1.88*   No results for input(s): TROPIPOC in the last 168 hours.   BNP Recent Labs Lab 09/04/17 1500 09/04/17 2105  BNP 43.2 147.3*     DDimer  Recent Labs Lab 09/04/17 1500   DDIMER <0.27     Radiology    No results found.   Telemetry    NSR - Personally Reviewed  ECG    pending - Personally Reviewed   Cardiac Studies   Echocardiogram 09/08/17: Study Conclusions - Left ventricle: The cavity size was normal. There was moderate   concentric hypertrophy. Systolic function was moderately to   severely reduced. The estimated ejection fraction was in the   range of 30% to 35%. There is akinesis of the anteroseptal,   lateral, inferior, and apical myocardium. Basal contractile   sparing noted. Consider stress-induced cardiomyopathy. Acoustic   contrast opacification revealed no evidence ofthrombus. - Aortic valve: Trileaflet; mildly thickened, mildly calcified   leaflets. - Mitral valve: Mildly thickened leaflets . There was mild   regurgitation.   Echocardiogram 09/08/17: Study Conclusions - Left ventricle: The cavity size was normal. There was moderate concentric hypertrophy. Systolic function was moderately to severely reduced. The estimated ejection fraction was in the range of 30% to 35%. There is akinesis of the anteroseptal, lateral, inferior, and apical myocardium. Basal contractile sparing noted. Consider stress-induced cardiomyopathy. Acoustic contrast opacification revealed no evidence ofthrombus. - Aortic valve: Trileaflet; mildly thickened, mildly calcified leaflets. - Mitral valve: Mildly thickened leaflets . There was mild regurgitation.   Left heart cath 09/05/17:  Dist LAD lesion, 65 %stenosed. Diffuse tapering from the mid vessel distally around the apex. The vessel has the appearance of potentially diffuse intramural thrombus.  There is moderate to severe left ventricular systolic dysfunction. The left ventricular ejection fraction is 25-35% by visual estimate. - In a Takotsubo pattern  LV end diastolic pressure is moderately elevated. 22 mmHg  There is moderate (3+) mitral regurgitation.  There is no  aortic valve stenosis.  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 There is pruning of the septal branches and area that should be covered with with a diagonal branch  is empty.  The patient was somewhat hypotensive due to sedation in the Cath Lab, but does not have the appearance of being a cardiac shock. Her blood pressures were mostly in the 110s. I did start low-dose Levophed to allow for sedation as she became agitated with the ventilator in place.  The patient was transferred back to the CCU for supportive care. ABG looked excellent. I suspect that she would benefit from at least one day of rest and potentially another dose of IV Lasix depending on her pressures.  As her pressure tolerates, we can wean off Levophed and potentially consider adding either amlodipine or Imdur for her definitive diffuse LAD disease.   Patient Profile     66 y.o. female  with long-standing history of "mitral valve prolapse" and murmur, presents with acute pulmonary edema preceded by several days of chest discomfort, found and angiography to have diffuse attenuation of the mid-distal LAD without focal lesions and wall motion abnormalities consistent with takotsubo sd. Minor increase in cardiac troponin I to peak of 3.6.  Assessment & Plan    1.  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 Mitral regurgitation - soft murmur, stable   3. Prolonged QTc - EKG today pending   Disposition: she is medically ready for transfer to inpatient rehab once  insurance authorization is completed and there is bed availability.   Signed, Tami Lin Duke , PA-C 8:22 AM 09/10/2017 Pager: [phone]  I have seen and examined the patient along with Ledora Bottcher, PA.  I have reviewed the chart, notes and new data.  I agree with PA/NP's note.  Key new complaints: weakness remains major complaint. Declined MRI. Key examination changes: no overt hypervolemia by exam.    PLAN: Transfer to CIR. Continue oral antibiotics for another 3 days. Stop doxycycline on 9/16. Might be able to discontinue furosemide by the time of discharge from CIR.  Sanda Klein, MD, [place] [phone] 09/10/2017, 10:35 AM

## 2017-09-10 NOTE — Discharge Summary (Signed)
Discharge Summary    Patient ID: Brenda Lloyd,  MRN: [id number], DOB/AGE: 07-20-1951 66 y.o.  Admit date: 09/04/2017 Discharge date: 09/10/2017  Primary Care Provider: [name] Primary Cardiologist: Dr Royann Shivers  Discharge Diagnoses    Principal Problem:   NSTEMI (non-ST elevated myocardial infarction) [facility]) Active Problems:   Systolic murmur   Flash pulmonary edema (HCC)   Acute respiratory failure with hypoxia (HCC)   Intubation of airway performed without difficulty   Unstable angina (HCC)   Cardiogenic shock (HCC)   CHF (congestive heart failure) (HCC)   Leukocytosis   Hyperlipidemia   Benign essential HTN   Allergies Allergies  Allergen Reactions  . Amoxicillin Anaphylaxis  . Ciprofloxacin Anaphylaxis  . Fluocinolone Rash    Reported by [facility] 12/09/11 - unknown to spouse  . Penicillins Anaphylaxis    Has patient had a PCN reaction causing immediate rash, facial/tongue/throat swelling, SOB or lightheadedness with hypotension: Yes Has patient had a PCN reaction causing severe rash involving mucus membranes or skin necrosis: No Has patient had a PCN reaction that required hospitalization: No Has patient had a PCN reaction occurring within the last 10 years: No If all of the above answers are "NO", then may proceed with Cephalosporin use.  . Sulfa Antibiotics Anaphylaxis  . Influenza Vaccines Swelling    Caused arm swelling    Diagnostic Studies/Procedures    ECHO: 09/08/2017 - Left ventricle: The cavity size was normal. There was moderate   concentric hypertrophy. Systolic function was moderately to   severely reduced. The estimated ejection fraction was in the   range of 30% to 35%. There is akinesis of the anteroseptal,   lateral, inferior, and apical myocardium. Basal contractile   sparing noted. Consider stress-induced cardiomyopathy. Acoustic   contrast opacification revealed no evidence ofthrombus. - Aortic valve: Trileaflet; mildly  thickened, mildly calcified   leaflets. - Mitral valve: Mildly thickened leaflets . There was mild   regurgitation.  CATH: 09/05/2017  Dist LAD lesion, 65 %stenosed. Diffuse tapering from the mid vessel distally around the apex. The vessel has the appearance of potentially diffuse intramural thrombus.  There is moderate to severe left ventricular systolic dysfunction. The left ventricular ejection fraction is 25-35% by visual estimate. - In a Takotsubo pattern  LV end diastolic pressure is moderately elevated. 22 mmHg  There is moderate (3+) mitral regurgitation.  There is no aortic valve stenosis.     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 There is pruning of the septal branches and area that should be covered with with a diagonal branch is empty.    The patient was somewhat hypotensive due to sedation in the Cath Lab, but does not have the appearance of being a cardiac shock. Her blood pressures were mostly in the 110s. I did start low-dose Levophed to allow for sedation as she became agitated with the ventilator in place.     The patient was transferred back to the CCU for supportive care. ABG looked excellent. I suspect that she would benefit from at least one day of rest and potentially another dose of IV Lasix depending on her pressures.    As her pressure tolerate, we can wean off Levophed and potentially consider adding either amlodipine or Imdur for her definitive diffuse LAD disease. Diagnostic Diagram      EEG: 09/05/2017 Description of the recording Posterior  dominant rhythm is 6-7 Hz symmetrical and reactive but intermixed with fast beta activity EEG comprised of generalized beta activity which is medication effect such as benzodiazepine. Focal slowing, Epileptiform features were not seen during this recording Sleep  architecture was not observed Impression  The EEG is abnormal and findings are suggestive of Nonspecific generalized cerebral dysfunction. Epileptiform activity was not seen during this recording. _____________   History of Present Illness     66 y.o.white female without known history of CAD and on no meds presented with chest pain [facility].  Patient had first episode of chest pain 6 mo ago and then has been having accelerated angina for the last 5 days with exertion. It was burning like pain, 6/10 in intensity, without radiation. She started having pain around 1:30 pm 09/08 when she was pushing a wheel chair and running. The pain improved with NTG. Her initial trop was elevated on arrival and then increased to 3.15. She is currently chest pain free. CP was associated with nausea, presyncope, nausea/vomiting. It improved after 1 hr. She was admitted for further evaluation and treatment.  Hospital Course     Consultants: CCM, Neurology, Rehab   Early on the morning after her admission, she became hypoxic and short of breath with ambulation. She required a nonrebreather. She had flash pulmonary edema. She was transferred to CCU and scheduled for an urgent cath.  Her condition worsened and she required intubation upon arrival at the CCU. She was taken to the cath lab, results are above. The LAD angiography was very unusual and has the appearance of a possible diffuse spasm versus more likely diffuse intramural thrombus. Her EF was 25-30%. She was started on low-dose Levophed to facilitate diuresis but was not considered to be in cardiogenic shock. This was felt secondary to Takotsubo cardiomyopathy. She has numerous social stressors at home and was physically exerting herself just before admission. Her blood pressures remained soft, and low-dose beta blocker was added on 09/14 and low-dose losartan is also to be added if her blood pressure tolerates the beta blocker.  She was  seen by the CCM service who managed her acute respiratory failure and sedation. CCM noted her TSH was low and checked a free T4, but that was within normal limits. A thyroid nodule was seen during a CT scan of the neck. After she recovers from the acute illness, she would benefit from an endocrinology evaluation.  Later on 09/05/2017, Dr. Johney Frame with by to check on the patient and she was actively seizing. She was treated with IV Ativan and Versed. A neurology consult was called.  She was seen by Dr. Wilford Corner. CT of the head did not show any acute abnormalities. She was started on Keppra, and had an EEG that was abnormal, but did not indicate epilepsy, just nonspecific generalized cerebral dysfunction. An MRI was ordered but has not been performed. Once the MRI is performed, neurology can make final recommendations.  Her x-rays were reviewed and sputum culture were reviewed, she has Staphylococcus aureus tracheobronchitis, she was placed on antibiotics. It was felt she could wean off pressors post extubation. She improved to the point she could be extubated on 09/06/2017. She is to be on antibiotics through 09/13/2017. She was changed to oral antibiotics on 09/09/2017 and will finish up her course of antibiotics with doxycycline.  She was noted to have mitral regurgitation and her murmur was quite prominent on admission. An echo was performed on 09/08/2017 and  the MR had improved. Her echo will need to be repeated when she recovers.  Her QT was noted to be long at 504 ms on ECG. This was considered to be part of the stress cardiomyopathy syndrome in the setting of electrolyte imbalance. She is to avoid medications that can worsen QT prolongation. Her potassium was low at times, this will need to be followed as well.  She was felt to have significant deconditioning, and a rehabilitation consult was called. She was seen by the rehabilitation team and inpatient rehabilitation was recommended. She has accepted  the bed.   On 09/14, she was evaluated by Dr. Royann Shivers and all data with you. No further inpatient workup was indicated and she is considered stable for discharge to inpatient rehabilitation.   _____________  Discharge Vitals Blood pressure 140/75, pulse 82, temperature 98 F (36.7 C), temperature source Oral, resp. rate (!) 23, height  (1.753 m), weight 183 lb 4.8 oz (83.1 kg), SpO2 95 %.  Filed Weights   09/08/17 0401 09/09/17 0445 09/10/17 0506  Weight: 195 lb 3.2 oz (88.5 kg) 187 lb 11.2 oz (85.1 kg) 183 lb 4.8 oz (83.1 kg)    Labs & Radiologic Studies    CBC  Recent Labs  09/08/17 0326  WBC 11.6*  NEUTROABS 8.8*  HGB 10.1*  HCT 31.6*  MCV 87.1  PLT 216   Basic Metabolic Panel  Recent Labs  09/08/17 0326 09/09/17 0219  NA 139 141  K 3.2* 3.5  CL 106 106  CO2 29 29  GLUCOSE 111* 111*  BUN 6 7  CREATININE 0.53 0.57  CALCIUM 8.2* 8.4*  MG 2.0  --   PHOS 2.3*  --    Liver Function Tests  Recent Labs  09/08/17 0326  ALBUMIN 2.7*    Cardiac Enzymes Troponin I  Date Value Ref Range Status  09/05/2017 1.88 (HH) <0.03 ng/mL Final    Comment:    CRITICAL VALUE NOTED.  VALUE IS CONSISTENT WITH PREVIOUSLY REPORTED AND CALLED VALUE.  09/05/2017 3.67 (HH) <0.03 ng/mL Final    Comment:    CRITICAL VALUE NOTED.  VALUE IS CONSISTENT WITH PREVIOUSLY REPORTED AND CALLED VALUE.  09/04/2017 2.35 (HH) <0.03 ng/mL Final    Comment:    CRITICAL RESULT CALLED TO, READ BACK BY AND VERIFIED WITH: JADJI,R RN 09/04/2017 2225 JORDANS   09/04/2017 3.15 (HH) <0.03 ng/mL Final    Comment:    CRITICAL RESULT CALLED TO, READ BACK BY AND VERIFIED WITH: CHANIN MAYNARD RN  09/04/2017 OLSONM     BNP    Component Value Date/Time   BNP 147.3 (H) 09/04/2017 2105   Hemoglobin A1C Lab Results  Component Value Date   HGBA1C 5.7 (H) 09/04/2017   Fasting Lipid Panel Lab Results  Component Value Date   CHOL 187 09/04/2017   HDL 34 (L) 09/04/2017   LDLCALC 133  (H) 09/04/2017   TRIG 99 09/04/2017   CHOLHDL 5.5 09/04/2017    Thyroid Function Tests Lab Results  Component Value Date   TSH 0.107 (L) 09/04/2017   Free T4  Date Value Ref Range Status  09/05/2017 1.12 0.61 - 1.12 ng/dL Final   _____________  Ct Angio Head W Or Wo Contrast Addendum Date: 09/05/2017   ADDENDUM REPORT: 09/05/2017 16:36 ADDENDUM: Bilateral pleural effusions. Electronically Signed   By: Sebastian Ache M.D.   On: 09/05/2017 16:36  Result Date: 09/05/2017 CLINICAL DATA:  Seizure today. Cardiac catheterization earlier this morning. EXAM: CT ANGIOGRAPHY HEAD AND NECK  TECHNIQUE: Multidetector CT imaging of the head and neck was performed using the standard protocol during bolus administration of intravenous contrast. Multiplanar CT image reconstructions and MIPs were obtained to evaluate the vascular anatomy. Carotid stenosis measurements (when applicable) are obtained utilizing NASCET criteria, using the distal internal carotid diameter as the denominator. CONTRAST:  90 mL Isovue 370 COMPARISON:  None. FINDINGS: CT HEAD FINDINGS Brain: There is no evidence of acute infarct, intracranial hemorrhage, mass, midline shift, or extra-axial fluid collection. The ventricles and sulci are normal. Vascular: Minimal calcified atherosclerosis at the skullbase. Skull: No fracture or destructive osseous lesion. Sinuses: Mild mucosal thickening in the maxillary sinuses. Clear mastoid air cells. Orbits: Unremarkable. Review of the MIP images confirms the above findings CTA NECK FINDINGS Aortic arch: Standard 3 vessel aortic arch. Widely patent brachiocephalic and subclavian arteries. Right carotid system: Patent without evidence of stenosis, dissection, or significant atherosclerosis. Left carotid system: Patent without evidence of stenosis or dissection. Mild calcified plaque at the carotid bulb. Vertebral arteries: Patent without evidence of stenosis or dissection. The right vertebral artery is minimally  larger than the left. Small focal ossification is incidentally noted in the soft tissues adjacent to the left V3 segment at the C1 level, not affecting the vessel. Skeleton: Incidental incomplete fusion of the C1 posterior ring, a normal variant. Moderate disc degeneration at C5-6 and C6-7. Other neck: Asymmetric enlargement and heterogeneity of the right thyroid lobe with coarse calcification. Retained secretions in the pharynx with an endotracheal tube in place, terminating well above the carina. Upper chest: Partially visualized small bilateral pleural effusions and dependent atelectasis. Review of the MIP images confirms the above findings CTA HEAD FINDINGS Anterior circulation: The internal carotid arteries are widely patent from skullbase to carotid termini. ACAs and MCAs are patent with mild branch vessel irregularity but no evidence of proximal branch occlusion or significant stenosis. No aneurysm. Posterior circulation: The intracranial vertebral arteries are widely patent to the basilar. Patent right PICA, bilateral AICA, bilateral SCA origins are identified. The basilar artery is widely patent. There is a small right posterior communicating artery. PCAs are patent without evidence of significant stenosis. No aneurysm. Venous sinuses: As permitted by contrast timing, patent. Anatomic variants: None. Delayed phase: Not performed. Review of the MIP images confirms the above findings IMPRESSION: 1. No emergent large vessel occlusion. 2. Mild intracranial and extracranial atherosclerosis without significant stenosis. 3. No evidence of acute intracranial abnormality on CT. These results were called by telephone at the time of interpretation on 09/05/2017 at 4:12 pm to Dr. Milon Dikes , who verbally acknowledged these results. Electronically Signed: By: Sebastian Ache M.D. On: 09/05/2017 16:13   Dg Chest 2 View Result Date: 09/04/2017 CLINICAL DATA:  65 year old female with progressive central chest pain and  shortness of breath EXAM: CHEST  2 VIEW COMPARISON:  None. FINDINGS: The lungs are clear and negative for focal airspace consolidation, pulmonary edema or suspicious pulmonary nodule. No pleural effusion or pneumothorax. Cardiac and mediastinal contours are within normal limits. No acute fracture or lytic or blastic osseous lesions. The visualized upper abdominal bowel gas pattern is unremarkable. IMPRESSION: Negative chest x-ray. Electronically Signed   By: Malachy Moan M.D.   On: 09/04/2017 15:29   Dg Chest Port 1 View Result Date: 09/05/2017 CLINICAL DATA:  Right central line placement EXAM: PORTABLE CHEST 1 VIEW COMPARISON:  09/05/2017 FINDINGS: Endotracheal tube with tip measuring 4.5 cm above the carina. Interval placement of a right central venous catheter. Tip localizes to the  right atrium, about 2.3 cm below the expected location of the cavoatrial junction. Consider retracting the catheter 2.5 cm and placement at the cavoatrial junction is desired. Heart size and pulmonary vascularity are normal. Lungs are clear and expanded. No blunting of costophrenic angles. No pneumothorax. Mediastinal contours appear intact. IMPRESSION: Right central venous catheter has been placed. Tip projects over the right atrium about 2.3 cm below the expected location of the cavoatrial junction. Electronically Signed   By: Burman Nieves M.D.   On: 09/05/2017 21:30   Dg Chest Port 1 View Result Date: 09/05/2017 CLINICAL DATA:  66 year old female status post intubation EXAM: PORTABLE CHEST 1 VIEW COMPARISON:  Prior chest x-ray earlier today at 5:17 a.m. FINDINGS: Interval intubation. The endotracheal tube is well positioned 4.1 cm above the carina. Stable cardiomegaly. Persistent and slightly increased pulmonary vascular congestion with mild interstitial edema. No pneumothorax or focal airspace consolidation. No acute osseous abnormality. IMPRESSION: 1. The endotracheal tube is well positioned with the tip 4.1 cm above  the carina. 2. Mild CHF with cardiomegaly, pulmonary vascular congestion and interstitial edema. Electronically Signed   By: Malachy Moan M.D.   On: 09/05/2017 09:34   Dg Chest Port 1 View Result Date: 09/05/2017 CLINICAL DATA:  Increased shortness of breath. History of hypertension. EXAM: PORTABLE CHEST 1 VIEW COMPARISON:  Chest radiograph September 04, 2017 FINDINGS: Cardiac silhouette is upper limits of normal size. Mediastinal silhouette is nonsuspicious. Increasing interstitial prominence without pleural effusion or focal consolidation. No pneumothorax. Soft tissue planes and included osseous structures are nonsuspicious. IMPRESSION: New interstitial prominence seen with pulmonary edema and atypical infection. No focal consolidation. Electronically Signed   By: Awilda Metro M.D.   On: 09/05/2017 05:43   Disposition   Pt is being discharged to inpatient rehabilitation today in good condition.  Follow-up Plans & Appointments    Follow-up Information    Croitoru, Mihai, MD Follow up.   Specialty:  Cardiology Why:  The office will call. Contact information: [address] [place] [address] [phone]          Discharge Instructions    Diet - low sodium heart healthy    Complete by:  As directed    Increase activity slowly    Complete by:  As directed       Discharge Medications   Current Discharge Medication List    START taking these medications   Details  atorvastatin (LIPITOR) 80 MG tablet Take 1 tablet (80 mg total) by mouth daily at 6 PM. Qty: 30 tablet, Refills: 11    benzonatate (TESSALON) 100 MG capsule Take 1 capsule (100 mg total) by mouth 3 (three) times daily as needed for cough. Qty: 20 capsule, Refills: 0    carvedilol (COREG) 3.125 MG tablet Take 1 tablet (3.125 mg total) by mouth 2 (two) times daily with a meal. Qty: 60 tablet, Refills: 11    doxycycline (VIBRA-TABS) 100 MG tablet Take 1 tablet (100 mg total) by mouth every  12 (twelve) hours. Qty: 8 tablet, Refills: 0    furosemide (LASIX) 40 MG tablet Take 1 tablet (40 mg total) by mouth daily. Qty: 30 tablet, Refills: 3    guaiFENesin-dextromethorphan (ROBITUSSIN DM) 100-10 MG/5ML syrup Take 5 mLs by mouth every 4 (four) hours as needed for cough. Qty: 118 mL, Refills: 0    ipratropium-albuterol (DUONEB) 0.5-2.5 (3) MG/3ML SOLN Take 3 mLs by nebulization as needed. Qty: 360 mL, Refills: 0    losartan (COZAAR) 25 MG tablet  Take 0.5 tablets (12.5 mg total) by mouth daily. Qty: 30 tablet, Refills: 1    nitroGLYCERIN (NITROSTAT) 0.4 MG SL tablet Place 1 tablet (0.4 mg total) under the tongue every 5 (five) minutes as needed for chest pain. Qty: 25 tablet, Refills: 12    potassium chloride (K-DUR,KLOR-CON) 10 MEQ tablet Take 1 tablet (10 mEq total) by mouth 2 (two) times daily. Qty: 60 tablet, Refills: 1      CONTINUE these medications which have NOT CHANGED   Details  acetaminophen (TYLENOL) 500 MG tablet Take 1,000 mg by mouth every 6 (six) hours as needed (pain).    aspirin EC 81 MG tablet Take 81 mg by mouth daily.    ibuprofen (ADVIL,MOTRIN) 200 MG tablet Take 400 mg by mouth every 6 (six) hours as needed for headache (pain).         Aspirin prescribed at discharge?  Yes High Intensity Statin Prescribed? (Lipitor 40-80mg  or Crestor 20-40mg ): Yes Beta Blocker Prescribed? Yes For EF <40%, was ACEI/ARB Prescribed? Yes ADP Receptor Inhibitor Prescribed? (i.e. Plavix etc.-Includes Medically Managed Patients): No: Takotsubo For EF <40%, Aldosterone Inhibitor Prescribed? No: low BP Was EF assessed during THIS hospitalization? Yes Was Cardiac Rehab II ordered? (Included Medically managed Patients): No: Takotsubo   Outstanding Labs/Studies   MRI  Duration of Discharge Encounter   Greater than 30 minutes including physician time.  Melida Quitter NP 09/10/2017, 12:55 PM

## 2017-09-10 NOTE — Progress Notes (Signed)
I met with pt and spouse at bedside to discuss an inpt rehab admit. They are both in agreement. I have insurance approval. I discussed with attending MD and will make the arrangements to admit today. RN CM made aware. [phone]

## 2017-09-10 NOTE — PMR Pre-admission (Signed)
PMR Admission Coordinator Pre-Admission Assessment  Patient: Brenda Lloyd is an 66 y.o., female MRN: [id number] DOB: 02-08-1951 Height: '5\' 9"'  (175.3 cm) Weight: 83.1 kg (183 lb 4.8 oz)              Insurance Information HMO:    PPO: yes     PCP:      IPA:      80/20:      OTHER: medicare advantage plan PRIMARY: [place]    Policy#: [id number]      Subscriber: pt CM Name: Brenda Lloyd      Phone#: [phone]     Fax#: [phone] Pre-Cert#: [id number] approved for 7 days with f/u with Vevelyn Royals phone [phone] fax [phone]      Employer: retired Benefits:  Phone #: [phone]     Name: 09/09/17 Eff. Date: 07/28/17     Deduct: none      Out of Pocket Max: $3300      Life Max: none CIR: $150 co pay per day days 1-10 then covers 100%      SNF: no co pay days 1-20: $50 co pay per day days 21-100  Outpatient: $20 co pay per visit     Co-Pay: visits per medical neccesity Home Health: 100%      Co-Pay: visits per medical neccesity DME: 80%     Co-Pay: 20% Providers: in network  SECONDARY: none       Medicaid Application Date:       Case Manager:  Disability Application Date:       Case Worker:   Emergency Facilities manager Information    Name Relation Home Work Mobile   Brenda Lloyd Spouse   [phone]     Current Medical History  Patient Admitting Diagnosis: debility  History of Present Illness: Brenda Donate Dillonis a 66 y.o.right handed femalewith history of hypertension, mitral valve prolapse.  Presented 09/04/2017 with intermittent chest pain over a 5 day without radiation as well as shortness of breath and nausea. There was some concern of possible seizure with EEG completed that was negative Troponin elevated 3.15. EKG normal sinus rhythm with Q waves anteriorly, mild ST elevation. Oxygen saturations dipping into the 80s. Cardiac catheterization showed distal LAD lesion, 65% stenosed area and diffuse tapering from the mid vessel distally  around the apex. The vessel had appearance of potentially diffuse intramural thrombus. Moderate to severe left ventricular systolic dysfunction. Ejection fraction 25-35%. Findings most consistent with takotsubocardiomyopathy. Hospital course complicated by intubation for respiratory failure. A follow-up echocardiogram was completed showing ejection fraction of 35% no evidence of thrombus. Current medical management with aspirin.Patient with some increased somnolence after cardiac catheterization. She was found to have a seizure and started on Keppra. Patient was staph aureus tracheobronchitis currently maintained on antibiotic coverage. She was extubated 09/06/2017.   Past Medical History  Past Medical History:  Diagnosis Date  . Hypertension     Family History  family history is not on file.  Prior Rehab/Hospitalizations:  Has the patient had major surgery during 100 days prior to admission? No  Current Medications   Current Facility-Administered Medications:  .  0.9 %  sodium chloride infusion, 250 mL, Intravenous, PRN, Leonie Man, MD, Last Rate: 10 mL/hr at 09/07/17 2200, 250 mL at 09/07/17 2200 .  acetaminophen (TYLENOL) suppository 650 mg, 650 mg, Rectal, Q6H PRN, Anders Simmonds, MD, 650 mg at 09/05/17 1804 .  aspirin EC tablet 81 mg, 81 mg, Oral, Daily, Eula Fried, Waqas  T, MD, 81 mg at 09/10/17 1050 .  atorvastatin (LIPITOR) tablet 80 mg, 80 mg, Oral, q1800, Geralynn Ochs T, MD, 80 mg at 09/09/17 1935 .  benzonatate (TESSALON) capsule 100 mg, 100 mg, Oral, TID PRN, Croitoru, Mihai, MD .  carvedilol (COREG) tablet 3.125 mg, 3.125 mg, Oral, BID WC, Duke, Tami Lin, PA, 3.125 mg at 09/10/17 1054 .  chlorhexidine gluconate (MEDLINE KIT) (PERIDEX) 0.12 % solution 15 mL, 15 mL, Mouth Rinse, BID, Leonie Man, MD, 15 mL at 09/09/17 1038 .  Chlorhexidine Gluconate Cloth 2 % PADS 6 each, 6 each, Topical, Daily, Aldean Jewett, MD, 6 each at 09/09/17 1000 .  doxycycline  (VIBRA-TABS) tablet 100 mg, 100 mg, Oral, Q12H, Croitoru, Mihai, MD, 100 mg at 09/10/17 1050 .  furosemide (LASIX) tablet 40 mg, 40 mg, Oral, Daily, Croitoru, Mihai, MD, 40 mg at 09/10/17 1050 .  guaiFENesin-dextromethorphan (ROBITUSSIN DM) 100-10 MG/5ML syrup 5 mL, 5 mL, Oral, Q4H PRN, Ledora Bottcher, PA, 5 mL at 09/09/17 2130 .  heparin injection 5,000 Units, 5,000 Units, Subcutaneous, Q8H, Leonie Man, MD, 5,000 Units at 09/10/17 0555 .  ipratropium-albuterol (DUONEB) 0.5-2.5 (3) MG/3ML nebulizer solution 3 mL, 3 mL, Nebulization, PRN, Geralynn Ochs T, MD, 3 mL at 09/05/17 0518 .  losartan (COZAAR) tablet 12.5 mg, 12.5 mg, Oral, Daily, Duke, Tami Lin, [place] .  MEDLINE mouth rinse, 15 mL, Mouth Rinse, QID, Leonie Man, MD, 15 mL at 09/09/17 1253 .  nitroGLYCERIN (NITROSTAT) SL tablet 0.4 mg, 0.4 mg, Sublingual, Q5 min PRN, Charlesetta Shanks, MD, 0.4 mg at 09/05/17 0432 .  ondansetron (ZOFRAN) injection 4 mg, 4 mg, Intravenous, Q8H PRN, Duke, Tami Lin, PA, 4 mg at 09/09/17 2130 .  pneumococcal 23 valent vaccine (PNU-IMMUNE) injection 0.5 mL, 0.5 mL, Intramuscular, Tomorrow-1000, Allred, James, MD .  potassium chloride (K-DUR,KLOR-CON) CR tablet 10 mEq, 10 mEq, Oral, BID, Croitoru, Mihai, MD, 10 mEq at 09/10/17 1051 .  potassium chloride SA (K-DUR,KLOR-CON) CR tablet 20 mEq, 20 mEq, Oral, BID, Croitoru, Mihai, MD, 20 mEq at 09/10/17 1050 .  sodium chloride flush (NS) 0.9 % injection 10-40 mL, 10-40 mL, Intracatheter, Q12H, Aljishi, Virgina Norfolk, MD, 10 mL at 09/09/17 1000 .  sodium chloride flush (NS) 0.9 % injection 10-40 mL, 10-40 mL, Intracatheter, PRN, Aljishi, Wael Z, MD .  sodium chloride flush (NS) 0.9 % injection 3 mL, 3 mL, Intravenous, Q12H, Geralynn Ochs T, MD, 3 mL at 09/08/17 2128 .  sodium chloride flush (NS) 0.9 % injection 3 mL, 3 mL, Intravenous, Q12H, Leonie Man, MD, 3 mL at 09/10/17 1058 .  sodium chloride flush (NS) 0.9 % injection 3 mL, 3 mL, Intravenous,  PRN, Leonie Man, MD  Patients Current Diet: Diet 2 gram sodium Room service appropriate? Yes; Fluid consistency: Thin  Precautions / Restrictions Precautions Precautions: Fall Precaution Comments: watch O2 sats Restrictions Weight Bearing Restrictions: No   Has the patient had 2 or more falls or a fall with injury in the past year?No  Prior Activity Level Community (5-7x/wk): active, caring for 5 year old grandson; runs; gradual decline in last 2 months. Also cares for 70 month old grandson through out the week. She has been exercising with her 9 year old grandson to assist him in losing weight but gradual decline in past 2 months due to endurance and SOB and chest pain  Home Assistive Devices / Equipment Home Assistive Devices/Equipment: Eyeglasses Home Equipment: None  Prior Device Use: Indicate devices/aids used by  the patient prior to current illness, exacerbation or injury? None of the above  Prior Functional Level Prior Function Level of Independence: Independent Comments: has custody of 76 year old grandson; 26 month old during the day also  Self Care: Did the patient need help bathing, dressing, using the toilet or eating?  Independent  Indoor Mobility: Did the patient need assistance with walking from room to room (with or without device)? Independent  Stairs: Did the patient need assistance with internal or external stairs (with or without device)? Independent  Functional Cognition: Did the patient need help planning regular tasks such as shopping or remembering to take medications? Independent  Current Functional Level Cognition  Overall Cognitive Status: Impaired/Different from baseline Orientation Level: Oriented to person, Oriented to place, Oriented to situation, Disoriented to time Safety/Judgement: Decreased awareness of deficits, Decreased awareness of safety    Extremity Assessment (includes Sensation/Coordination)  Upper Extremity Assessment:  Generalized weakness  Lower Extremity Assessment: Defer to PT evaluation    ADLs  Overall ADL's : Needs assistance/impaired Grooming: Wash/dry hands, Wash/dry face, Min guard, Standing Upper Body Bathing: Min guard, Standing Lower Body Bathing: Moderate assistance Upper Body Dressing : Min guard, Standing Lower Body Dressing: Moderate assistance Toilet Transfer: Moderate assistance Toileting- Clothing Manipulation and Hygiene: Min guard Functional mobility during ADLs: Minimal assistance, Cueing for safety, Rolling walker    Mobility  Overal bed mobility: Needs Assistance Bed Mobility: Supine to Sit Rolling: Min assist Sidelying to sit: Min assist Supine to sit: HOB elevated, Supervision General bed mobility comments: pt up in recliner upon arrival    Transfers  Overall transfer level: Needs assistance Equipment used: 1 person hand held assist, Rolling walker (2 wheeled) Transfers: Sit to/from Stand Sit to Stand: Min assist General transfer comment: used RW to ambulate to bathroom, cues for correct hand placement    Ambulation / Gait / Stairs / Wheelchair Mobility  Ambulation/Gait Ambulation/Gait assistance: Min assist, Mod assist Ambulation Distance (Feet): 160 Feet Assistive device: None Gait Pattern/deviations: Step-through pattern, Decreased stride length, Drifts right/left, Shuffle General Gait Details: assist for balance up to mod A at times due to unsteady and declined to use walker.  SpO2 during ambulation on 2L O2 maintained above 90%; on RA SpO2 droppted to 83% and pt reported SOB/acvitity intolerance    Posture / Balance Balance Overall balance assessment: Needs assistance Sitting-balance support: Feet supported Sitting balance-Leahy Scale: Good Standing balance support: No upper extremity supported Standing balance-Leahy Scale: Poor Standing balance comment: needs assistance in standing without UE support    Special needs/care consideration BiPAP/CPAP   N/a CPM  N/a Continuous Drip IV  N/a Dialysis  N/a Life Vest  N/a Oxygen 2 liters nasal cannula prn; not used pta Special Bed  N/a Trach Size  N/a Wound Vac n/a Skin  intact Bowel mgmt: LBM 9/13 some diarrhea Bladder mgmt: continent Diabetic mgmt n/a   Previous Home Environment Living Arrangements: Spouse/significant other  Lives With: Spouse (has full custody of 39 year old grandson) Available Help at Discharge: Family, Available 24 hours/day (spouse avaialble 24/7) Type of Home: House Home Layout: One level Home Access: Stairs to enter Entrance Stairs-Rails: None Entrance Stairs-Number of Steps: 3 Bathroom Shower/Tub: Public librarian, Architectural technologist: Standard Bathroom Accessibility: Yes How Accessible: Accessible via walker Home Care Services: No Additional Comments: endurance poor  Discharge Living Setting Plans for Discharge Living Setting: Patient's home, Lives with (comment) (spouse and 51 year old grandson) Type of Home at Discharge: House Discharge Home Layout: One  level Discharge Home Access: Stairs to enter Entrance Stairs-Rails: None Entrance Stairs-Number of Steps: 3 Discharge Bathroom Shower/Tub: Tub/shower unit, Curtain Discharge Bathroom Toilet: Standard Discharge Bathroom Accessibility: Yes How Accessible: Accessible via walker Does the patient have any problems obtaining your medications?: No  Social/Family/Support Systems Patient Roles: Spouse, Parent, Caregiver Contact Information: spouse Anticipated Caregiver: spouse Anticipated Caregiver's Contact Information: see above Ability/Limitations of Caregiver: none Caregiver Availability: 24/7 Discharge Plan Discussed with Primary Caregiver: Yes Is Caregiver In Agreement with Plan?: Yes Does Caregiver/Family have Issues with Lodging/Transportation while Pt is in Rehab?: No  Goals/Additional Needs Patient/Family Goal for Rehab: Mod I to supervision with PT and OT Expected length of stay:  ELOS 7 days Pt/Family Agrees to Admission and willing to participate: Yes Program Orientation Provided & Reviewed with Pt/Caregiver Including Roles  & Responsibilities: Yes  Decrease burden of Care through IP rehab admission: n/a  Possible need for SNF placement upon discharge:not anticipated  Patient Condition: This patient's condition remains as documented in the consult dated 09/09/2017, in which the Rehabilitation Physician determined and documented that the patient's condition is appropriate for intensive rehabilitative care in an inpatient rehabilitation facility. Will admit to inpatient rehab today.  Preadmission Screen Completed By:  Cleatrice Burke, 09/10/2017 11:44 AM ______________________________________________________________________   Discussed status with Dr. Posey Pronto on 09/10/2017 at  1239 and received telephone approval for admission today.  Admission Coordinator:  Cleatrice Burke, time 8676 Date 09/10/2017

## 2017-09-10 NOTE — Progress Notes (Signed)
Patient and family were informed about rehab process including patient safety plan and rehab booklet. 

## 2017-09-11 ENCOUNTER — Inpatient Hospital Stay (HOSPITAL_COMMUNITY): Payer: Medicare Other | Admitting: Occupational Therapy

## 2017-09-11 ENCOUNTER — Inpatient Hospital Stay (HOSPITAL_COMMUNITY): Payer: Medicare Other | Admitting: Physical Therapy

## 2017-09-11 DIAGNOSIS — J40 Bronchitis, not specified as acute or chronic: Secondary | ICD-10-CM

## 2017-09-11 DIAGNOSIS — R5381 Other malaise: Principal | ICD-10-CM

## 2017-09-11 DIAGNOSIS — J15211 Pneumonia due to Methicillin susceptible Staphylococcus aureus: Secondary | ICD-10-CM

## 2017-09-11 DIAGNOSIS — I5021 Acute systolic (congestive) heart failure: Secondary | ICD-10-CM

## 2017-09-11 MED ORDER — HYDROCODONE-HOMATROPINE 5-1.5 MG/5ML PO SYRP
5.0000 mL | ORAL_SOLUTION | Freq: Four times a day (QID) | ORAL | Status: DC | PRN
  Administered 2017-09-11 – 2017-09-13 (×5): 5 mL via ORAL
  Filled 2017-09-11 (×5): qty 5

## 2017-09-11 NOTE — Progress Notes (Addendum)
Physical Therapy Assessment and Plan  Patient Details  Name: Brenda Lloyd MRN: [id number] Date of Birth: Nov 10, 1951  PT Diagnosis: Abnormality of gait and Difficulty walking Rehab Potential: Excellent ELOS: 3-4 days   Today's Date: 09/11/2017 PT Individual Time: 0800-0906 PT Individual Time Calculation (min): 66 min    Problem List:  Patient Active Problem List   Diagnosis Date Noted  . Debility 09/10/2017  . CHF (congestive heart failure) ([place])   . Leukocytosis   . Hyperlipidemia   . Benign essential HTN   . Flash pulmonary edema ([place]) 09/05/2017  . Acute respiratory failure with hypoxia ([place]) 09/05/2017  . Intubation of airway performed without difficulty   . Unstable angina ([place])   . Cardiogenic shock ([place])   . NSTEMI (non-ST elevated myocardial infarction) ([place]) 09/04/2017  . Systolic murmur 25/04/3975    Past Medical History:  Past Medical History:  Diagnosis Date  . Hypertension    Past Surgical History:  Past Surgical History:  Procedure Laterality Date  . LEFT HEART CATH AND CORONARY ANGIOGRAPHY N/A 09/05/2017   Procedure: LEFT HEART CATH AND CORONARY ANGIOGRAPHY;  Surgeon: Leonie Man, MD;  Location: [facility];  Service: Cardiovascular;  Laterality: N/A;    Assessment & Plan Clinical Impression: Patient is a 66 y.o.right handed femalewith history of hypertension, mitral valve prolapse. Per chart review, patient, and family, patient lives with spouse independent prior to admission one level home with 3 steps to entry. Presented 09/04/2017 with intermittent chest pain over a 5 day without radiation as well as shortness of breath and nausea. There was some concern of possible seizure with EEG completed that was negative Troponin elevated 3.15. EKG normal sinus rhythm with Q waves anteriorly, mild ST elevation. Oxygen saturations dipping into the 80s. Cardiac catheterization showed distal LAD lesion, 65% stenosed area and diffuse tapering from the  mid vessel distally around the apex. The vessel had appearance of potentially diffuse intramural thrombus. Moderate to severe left ventricular systolic dysfunction. Ejection fraction 25-35%. Findings most consistent with takotsubocardiomyopathy. Hospital course complicated by intubation for respiratory failure. A follow-up echocardiogram was completed showing ejection fraction of 35% no evidence of thrombus. Current medical management with aspirin. She was found to have a seizure and started on Keppra and later discontinued due to somnolence. There was discussion of possible need for MRI of the brain the patient back to baseline and discontinued. Patient was staph aureus tracheobronchitis currently maintained on antibiotic coverage. She was extubated 09/06/2017. Patient transferred to CIR on 09/10/2017 .   Patient currently requires min with mobility secondary to decreased cardiorespiratoy endurance and decreased oxygen support.  Prior to hospitalization, patient was independent  with mobility and lived with Spouse, Family (40 year old grandson lives with them) in a House home.  Home access is 3Stairs to enter.  Patient will benefit from skilled PT intervention to maximize safe functional mobility, minimize fall risk and decrease caregiver burden for planned discharge home with intermittent assist.  Anticipate patient will benefit from follow up OP at discharge.  PT - End of Session Activity Tolerance: Tolerates < 10 min activity, no significant change in vital signs Endurance Deficit: Yes Endurance Deficit Description: decreased, O2 sat @ 90-91% on room air w/ activity, improves to baseline w/ seated rest PT Assessment Rehab Potential (ACUTE/IP ONLY): Excellent PT Barriers to Discharge: Medical stability;Home environment access/layout PT Barriers to Discharge Comments: Pt desats to 90% on room air w/ activity  PT Patient demonstrates impairments in the following area(s):  Endurance PT Transfers  Functional Problem(s): Bed Mobility;Car;Bed to Chair;Furniture;Floor PT Locomotion Functional Problem(s): Stairs;Ambulation PT Plan PT Intensity: Minimum of 1-2 x/day ,45 to 90 minutes PT Frequency: 5 out of 7 days PT Duration Estimated Length of Stay: 3-4 days PT Treatment/Interventions: Ambulation/gait training;Disease management/prevention;Stair training;Therapeutic Activities;Patient/family education;DME/adaptive equipment instruction;Psychosocial support;Therapeutic Exercise;Functional mobility training;Community reintegration;Discharge planning PT Transfers Anticipated Outcome(s): Mod I  PT Locomotion Anticipated Outcome(s): Mod I  PT Recommendation Follow Up Recommendations: Outpatient PT Patient destination: Home Equipment Recommended: To be determined  Skilled Therapeutic Intervention  Pt sitting EOB upon arrival and agreeable to therapy, no c/o pain. Worked on functional activity as detailed below in addition to endurance w/ mobility. Ambulated in 150-250' bouts w/ Min guard and no AD. Seated rest secondary to fatigue, O2 @ 86-90% on room air after mobility and returned to baseline w/ in 1-2 min of seated rest. Educated pt on typical progression of functional endurance after cardiac event and continuing to self-monitor vitals w/ being aware of clinical signs and symptoms of physiologic intolerance. Pt verbalized understanding as detailed below. PT instructed patient in PT Evaluation and initiated treatment intervention; see below for results. PT educated patient in [place], rehab potential, rehab goals, and discharge recommendations. NuStep @ L1 5 min x2 w/ rest in between to work on endurance at end of session. Ended session in recliner and in care of RN, call needs met.   PT Evaluation Precautions/Restrictions Precautions Precautions: Fall Restrictions Weight Bearing Restrictions: No General   Vital SignsTherapy Vitals Temp: 98.4 F (36.9 C) Temp Source: Oral Pulse Rate:  74 Resp: 16 BP: (!) 117/51 Patient Position (if appropriate): Lying Oxygen Therapy SpO2: 94 % O2 Device: Not Delivered Pain Pain Assessment Pain Assessment: No/denies pain Home Living/Prior Functioning Home Living Available Help at Discharge: Family;Available 24 hours/day (Spouse and grown children) Type of Home: House Home Access: Stairs to enter [employer] of Steps: 3 Entrance Stairs-Rails: None Home Layout: One level Bathroom Shower/Tub: Product/process development scientist: Standard Bathroom Accessibility: Yes  Lives With: Spouse;Family (61 year old grandson lives with them) Prior Function Level of Independence: Independent with basic ADLs;Independent with gait  Able to Take Stairs?: Yes Driving: Yes Vocation: Retired Public house manager Requirements: Her and her husband watches 81 month old grandson during the day Vision/Perception  Perception Perception: Within Functional Limits Praxis Praxis: Intact  Cognition Overall Cognitive Status: Within Functional Limits for tasks assessed Arousal/Alertness: Awake/alert Orientation Level: Oriented X4 Attention: Selective Selective Attention: Appears intact Memory: Appears intact Awareness: Appears intact Problem Solving: Appears intact Safety/Judgment: Appears intact Sensation Sensation Light Touch: Appears Intact Proprioception: Appears Intact Coordination Gross Motor Movements are Fluid and Coordinated: Yes Fine Motor Movements are Fluid and Coordinated: Yes Motor  Motor Motor: Within Functional Limits Motor - Skilled Clinical Observations: generalized weakness and pt reports feeling "heavy"  Mobility Bed Mobility Bed Mobility: Rolling Right;Rolling Left;Supine to Sit;Sit to Supine Rolling Right: 5: Supervision Rolling Left: 5: Supervision Supine to Sit: 5: Supervision Sit to Supine: 5: Supervision Transfers Transfers: Yes Sit to Stand: 4: Min guard Stand to Sit: 4: Min guard Stand Pivot Transfers:  4: Min guard Locomotion  Ambulation Ambulation: Yes Ambulation/Gait Assistance: 4: Min guard Ambulation Distance (Feet): 250 Feet Assistive device: None Gait Gait: Yes Gait Pattern: Impaired Gait Pattern: Shuffle;Decreased trunk rotation Gait velocity: decreased, very cautious Stairs / Additional Locomotion Stairs: Yes Stairs Assistance: 4: Min guard Stair Management Technique: Two rails Number of Stairs: 12 Height of Stairs: 6 Ramp: 4: Min assist Curb: 4: Min assist  Wheelchair Mobility Wheelchair Mobility: No, pt too high level and not appropriate for w/c mobility  Trunk/Postural Assessment  Cervical Assessment Cervical Assessment: Within Functional Limits Thoracic Assessment Thoracic Assessment: Within Functional Limits Lumbar Assessment Lumbar Assessment: Within Functional Limits Postural Control Postural Control: Within Functional Limits  Balance Balance Balance Assessed: Yes Standardized Balance Assessment Standardized Balance Assessment: Berg Balance Test;Dynamic Gait Index Berg Balance Test Sit to Stand: Able to stand without using hands and stabilize independently Standing Unsupported: Able to stand safely 2 minutes Sitting with Back Unsupported but Feet Supported on Floor or Stool: Able to sit safely and securely 2 minutes Stand to Sit: Sits safely with minimal use of hands Transfers: Able to transfer safely, minor use of hands Standing Unsupported with Eyes Closed: Able to stand 10 seconds safely Standing Ubsupported with Feet Together: Able to place feet together independently and stand 1 minute safely From Standing, Reach Forward with Outstretched Arm: Can reach confidently >25 cm (10") From Standing Position, Pick up Object from Floor: Able to pick up shoe safely and easily From Standing Position, Turn to Look Behind Over each Shoulder: Looks behind from both sides and weight shifts well Turn 360 Degrees: Able to turn 360 degrees safely but slowly Standing  Unsupported, Alternately Place Feet on Step/Stool: Able to stand independently and safely and complete 8 steps in 20 seconds Standing Unsupported, One Foot in Front: Able to place foot tandem independently and hold 30 seconds Standing on One Leg: Able to lift leg independently and hold 5-10 seconds Total Score: 53 Dynamic Gait Index Level Surface: Normal Change in Gait Speed: Mild Impairment Gait with Horizontal Head Turns: Normal Gait with Vertical Head Turns: Normal Gait and Pivot Turn: Normal Step Over Obstacle: Normal Step Around Obstacles: Normal Steps: Normal Total Score: 23 Extremity Assessment  RUE Assessment RUE Assessment: Not tested (Defer to OT) LUE Assessment LUE Assessment: Not tested (Defer to OT) RLE Assessment RLE Assessment: Within Functional Limits LLE Assessment LLE Assessment: Within Functional Limits   See Function Navigator for Current Functional Status.   Refer to Care Plan for Long Term Goals  Recommendations for other services: None   Discharge Criteria: Patient will be discharged from PT if patient refuses treatment 3 consecutive times without medical reason, if treatment goals not met, if there is a change in medical status, if patient makes no progress towards goals or if patient is discharged from hospital.  The above assessment, treatment plan, treatment alternatives and goals were discussed and mutually agreed upon: by patient  Mardel Grudzien K Arnette 09/11/2017, 9:07 AM

## 2017-09-11 NOTE — Progress Notes (Signed)
Progress Note  Patient Name: Brenda Lloyd Date of Encounter: 09/11/2017  Primary Cardiologist: New (Ambreen Tufte)  Subjective   Positive experience with first day rehabilitation. She did experience some exertional dyspnea. Biggest complaint remains generalized weakness. Persistent cough, not resolved with Tessalon Perles or guaiFENesin-dextromethorphan.  Inpatient Medications    Scheduled Meds: . aspirin EC  81 mg Oral Daily  . atorvastatin  80 mg Oral q1800  . carvedilol  3.125 mg Oral BID WC  . doxycycline  100 mg Oral Q12H  . furosemide  40 mg Oral Daily  . heparin  5,000 Units Subcutaneous Q8H  . losartan  12.5 mg Oral Daily  . potassium chloride  20 mEq Oral BID   Continuous Infusions:  PRN Meds: guaiFENesin-dextromethorphan, ipratropium-albuterol, nitroGLYCERIN, ondansetron **OR** ondansetron (ZOFRAN) IV, sorbitol, traZODone   Vital Signs    Vitals:   09/10/17 1426 09/11/17 0500 09/11/17 0543  BP: (!) 154/89  (!) 117/51  Pulse: (!) 57  74  Resp: 18  16  Temp: 97.8 F (36.6 C)  98.4 F (36.9 C)  TempSrc: Oral  Oral  SpO2: 97%  94%  Weight:  183 lb 9.6 oz (83.3 kg)     Intake/Output Summary (Last 24 hours) at 09/11/17 1004 Last data filed at 09/11/17 0800  Gross per 24 hour  Intake              360 ml  Output                0 ml  Net              360 ml   Filed Weights   09/11/17 0500  Weight: 183 lb 9.6 oz (83.3 kg)    Physical Exam  Smiling, relaxed GEN: No acute distress.   Neck: No JVD Cardiac: RRR, no murmurs, rubs, or gallops.  Respiratory: Clear to auscultation bilaterally. GI: Soft, nontender, non-distended  MS: No edema; No deformity. Neuro:  Nonfocal  Psych: Normal affect   Labs    Chemistry Recent Labs Lab 09/04/17 1500 09/05/17 0602  09/07/17 0406 09/08/17 0326 09/09/17 0219 09/10/17 1639  NA 140 139  < > 135 139 141  --   K 3.6 3.5  < > 3.5 3.2* 3.5  --   CL 105 107  < > 103 106 106  --   CO2 25 19*  < > --   GLUCOSE 154* 154*  < > 113* 111* 111*  --   BUN 12 8  < > --   CREATININE 0.70 0.61  < > 0.55 0.53 0.57 0.62  CALCIUM 9.7 8.8*  < > 8.5* 8.2* 8.4*  --   PROT 7.7 7.0  --   --   --   --   --   ALBUMIN 4.1 3.8  --  2.8* 2.7*  --   --   AST 27 38  --   --   --   --   --   ALT 33 31  --   --   --   --   --   ALKPHOS 82 85  --   --   --   --   --   BILITOT 0.8 1.5*  --   --   --   --   --   GFRNONAA >60 >60  < > >60 >60 >60 >60  GFRAA >60 >60  < > >60 >60 >60 >60  ANIONGAP 10 13  < > 9 4* 6  --   < > = values in this interval not displayed.   Hematology Recent Labs Lab 09/07/17 0406 09/08/17 0326 09/10/17 1639  WBC 15.0* 11.6* 9.7  RBC 3.91 3.63* 3.78*  HGB 11.2* 10.1* 10.7*  HCT 34.2* 31.6* 32.9*  MCV 87.5 87.1 87.0  MCH 28.6 27.8 28.3  MCHC 32.7 32.0 32.5  RDW 14.1 13.7 14.0  PLT 202 216 269    Cardiac Enzymes Recent Labs Lab 09/04/17 1857 09/04/17 2105 09/05/17 0202 09/05/17 0602  TROPONINI 3.15* 2.35* 3.67* 1.88*   No results for input(s): TROPIPOC in the last 168 hours.   BNP Recent Labs Lab 09/04/17 1500 09/04/17 2105  BNP 43.2 147.3*     DDimer  Recent Labs Lab 09/04/17 1500  DDIMER <0.27     Patient Profile     66 y.o. 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  Assessment & Plan    Continue losartan and furosemide at current doses. Reluctance to increase the ARB due to low diastolic blood pressure. I would not start her on beta blockers. Hydrocodone cough syrup, if okay with primary team.  For questions or updates, please contact [facility] Please consult [email or web address] for contact info under Cardiology/STEMI.      Signed, [name], MD  09/11/2017, 10:04 AM

## 2017-09-11 NOTE — Evaluation (Signed)
Occupational Therapy Assessment and Plan  Patient Details  Name: Brenda Lloyd MRN: [id number] Date of Birth: May 14, 1951  OT Diagnosis: muscle weakness (generalized) Rehab Potential: Rehab Potential (ACUTE ONLY): Excellent ELOS: 3-4 days   Today's Date: 09/11/2017 OT Individual Time: 5427-0623 and 1445-1520 OT Individual Time Calculation (min): 70 min and 35 min (missed 10 minutes last session due to fatigue)  Problem List:  Patient Active Problem List   Diagnosis Date Noted  . Acute systolic heart failure ([place])   . Staphylococcus aureus pneumonia ([place])   . Debility 09/10/2017  . CHF (congestive heart failure) ([place])   . Leukocytosis   . Hyperlipidemia   . Benign essential HTN   . Flash pulmonary edema ([place]) 09/05/2017  . Acute respiratory failure with hypoxia ([place]) 09/05/2017  . Intubation of airway performed without difficulty   . Unstable angina ([place])   . Cardiogenic shock ([place])   . NSTEMI (non-ST elevated myocardial infarction) ([place]) 09/04/2017  . Systolic murmur 76/28/3151    Past Medical History:  Past Medical History:  Diagnosis Date  . Hypertension    Past Surgical History:  Past Surgical History:  Procedure Laterality Date  . LEFT HEART CATH AND CORONARY ANGIOGRAPHY N/A 09/05/2017   Procedure: LEFT HEART CATH AND CORONARY ANGIOGRAPHY;  Surgeon: Leonie Man, MD;  Location: [facility];  Service: Cardiovascular;  Laterality: N/A;    Assessment & Plan Clinical Impression:  Brenda Lloyd is a 66 y.o. right handed female with history of hypertension, mitral valve prolapse. Per chart review, patient, and family, patient lives with spouse independent prior to admission one level home with 3 steps to entry. Presented 09/04/2017 with intermittent chest pain over a 5 day without radiation as well as shortness of breath and nausea. There was some concern of possible seizure with EEG completed that was negative Troponin elevated 3.15. EKG normal sinus  rhythm with Q waves anteriorly, mild ST elevation. Oxygen saturations dipping into the 80s. Cardiac catheterization showed distal LAD lesion, 65% stenosed area and diffuse tapering from the mid vessel distally around the apex. The vessel had appearance of potentially diffuse intramural thrombus. Moderate to severe left ventricular systolic dysfunction. Ejection fraction 25-35%. Findings most consistent with takotsubo cardiomyopathy. Hospital course complicated by intubation for respiratory failure. A follow-up echocardiogram was completed showing ejection fraction of 35% no evidence of thrombus. Current medical management with aspirin. She was found to have a seizure and started on Keppra and later discontinued due to somnolence. There was discussion of possible need for MRI of the brain the patient back to baseline and discontinued. Patient was staph aureus tracheobronchitis currently maintained on antibiotic coverage. She was extubated 09/06/2017. Physical and occupational therapy evaluations 09/08/2017 for deconditioning/impulsivity with recommendations of physical medicine rehabilitation consult.Patient was admitted for a comprehensive rehabilitation program.  Patient transferred to CIR on 09/10/2017 .    Patient currently requires supervision with basic self-care skills secondary to muscle weakness, decreased cardiorespiratoy endurance and decreased standing balance.  Prior to hospitalization, patient was fully independent and driving and caring for 19 month old.  Patient will benefit from skilled intervention to increase independence with basic self-care skills and increase level of independence with iADL prior to discharge home with care partner.  Anticipate patient will require intermittent supervision and no further OT follow recommended.  OT - End of Session Activity Tolerance: Tolerates 30+ min activity with multiple rests OT Assessment Rehab Potential (ACUTE ONLY): Excellent OT Patient  demonstrates impairments in the following area(s): Balance;Endurance  OT Basic ADL's Functional Problem(s): Dressing;Bathing;Toileting OT Advanced ADL's Functional Problem(s): Simple Meal Preparation;Light Housekeeping OT Transfers Functional Problem(s): Tub/Shower;Toilet OT Additional Impairment(s): None OT Plan OT Intensity: Minimum of 1-2 x/day, 45 to 90 minutes OT Frequency: 5 out of 7 days OT Duration/Estimated Length of Stay: 3-4 days OT Treatment/Interventions: Balance/vestibular training;Discharge planning;Functional mobility training;Self Care/advanced ADL retraining;Therapeutic Activities;Therapeutic Exercise OT Self Feeding Anticipated Outcome(s): independent OT Basic Self-Care Anticipated Outcome(s): independent OT Toileting Anticipated Outcome(s): independent OT Bathroom Transfers Anticipated Outcome(s): independent OT Recommendation Patient destination: Home Follow Up Recommendations: Other (comment) (cardiac rehab outpatient) Equipment Recommended: None recommended by OT   Skilled Therapeutic Intervention Visit 1:  No c/o pain Pt seen for initial evaluation, goal discussion, and ADL training. Pt completed shower, dressing, grooming including standing at sink to dry hair all with S as she ambulated around the room gathering supplies.  Pt took 2-3 short rest breaks.  Discussed POC and short estimated LOS to prepare for pt to go home.    Visit 2: no c/o pain, but stated she was feeling very tired Pt ambulated to tub room to practice stepping in and out of tub to her R with S.  She then ambulated to ADL apt to step in and out of tub to the L with S.  Short rest break on sofa.  O2 sats 100% on room air. In kitchen, she practiced removing and replacing numerous items from high shelves without difficulty. Pt stated that she was very sorry, but that she was so tired and really could not do anymore today. Ambulated back to room. Pt resting in recliner with all needs met.     OT  Evaluation Precautions/Restrictions  Precautions Precautions: Fall Restrictions Weight Bearing Restrictions: No    Vital Signs Therapy Vitals Pulse Rate: 76 Oxygen Therapy SpO2: 98 % O2 Device: Not Delivered Pain Pain Assessment Pain Assessment: No/denies pain Home Living/Prior Functioning Home Living Available Help at Discharge: Family, Available 24 hours/day Type of Home: House Home Access: Stairs to enter Technical brewer of Steps: 3 Entrance Stairs-Rails: None Home Layout: One level Bathroom Shower/Tub: Tub/shower unit, Industrial/product designer: Yes Additional Comments: endurance poor  Lives With: Spouse, Family Prior Function Level of Independence: Independent with basic ADLs, Independent with gait, Independent with homemaking with ambulation  Able to Take Stairs?: Yes Driving: Yes Vocation: Retired Public house manager Requirements: Her and her husband watches 43 month old grandson during the day Comments: has custody of 48 year old grandson; 64 month old during the day also ADL ADL ADL Comments: refer to functional navigator Vision Baseline Vision/History: Wears glasses Wears Glasses: At all times Patient Visual Report: No change from baseline Vision Assessment?: No apparent visual deficits Perception  Perception: Within Functional Limits Praxis Praxis: Intact Cognition Overall Cognitive Status: Within Functional Limits for tasks assessed Arousal/Alertness: Awake/alert Orientation Level: Person;Place;Situation Person: Oriented Place: Oriented Situation: Oriented Year: 2018 Month: September Day of Week: Correct Memory: Appears intact Immediate Memory Recall: Sock;Blue;Bed Memory Recall: Sock;Blue;Bed Memory Recall Sock: Without Cue Memory Recall Blue: Without Cue Memory Recall Bed: Without Cue Attention: Selective Selective Attention: Appears intact Awareness: Appears intact Problem Solving: Appears  intact Safety/Judgment: Appears intact Sensation Sensation Light Touch: Appears Intact Stereognosis: Appears Intact Hot/Cold: Appears Intact Proprioception: Appears Intact Coordination Gross Motor Movements are Fluid and Coordinated: Yes Fine Motor Movements are Fluid and Coordinated: Yes Motor  Motor Motor: Within Functional Limits Motor - Skilled Clinical Observations: generalized weakness and pt reports feeling "heavy" Mobility    S  with ambulation Trunk/Postural Assessment  Postural Control Postural Control: Within Functional Limits  Balance Dynamic Standing Balance Dynamic Standing - Level of Assistance: 5: Stand by assistance Extremity/Trunk Assessment RUE Assessment RUE Assessment: Within Functional Limits LUE Assessment LUE Assessment: Within Functional Limits   See Function Navigator for Current Functional Status.   Refer to Care Plan for Long Term Goals  Recommendations for other services: None    Discharge Criteria: Patient will be discharged from OT if patient refuses treatment 3 consecutive times without medical reason, if treatment goals not met, if there is a change in medical status, if patient makes no progress towards goals or if patient is discharged from hospital.  The above assessment, treatment plan, treatment alternatives and goals were discussed and mutually agreed upon: by patient  [place] 09/11/2017, 1:28 PM

## 2017-09-11 NOTE — Progress Notes (Signed)
Subjective/Complaints: C/o persistent cough since hospitalization.  Had tracheobronchitis and intubation No swallowing issues, wants a shower  ROS: No CP, SOB, N/V/D  Objective: Vital Signs: Blood pressure (!) 117/51, pulse 74, temperature 98.4 F (36.9 C), temperature source Oral, resp. rate 16, weight 83.3 kg (183 lb 9.6 oz), SpO2 94 %. No results found. Results for orders placed or performed during the hospital encounter of 09/10/17 (from the past 72 hour(s))  CBC     Status: Abnormal   Collection Time: 09/10/17  4:39 PM  Result Value Ref Range   WBC 9.7 4.0 - 10.5 K/uL   RBC 3.78 (L) 3.87 - 5.11 MIL/uL   Hemoglobin 10.7 (L) 12.0 - 15.0 g/dL   HCT 32.9 (L) 36.0 - 46.0 %   MCV 87.0 78.0 - 100.0 fL   MCH 28.3 26.0 - 34.0 pg   MCHC 32.5 30.0 - 36.0 g/dL   RDW 14.0 11.5 - 15.5 %   Platelets 269 150 - 400 K/uL  Creatinine, serum     Status: None   Collection Time: 09/10/17  4:39 PM  Result Value Ref Range   Creatinine, Ser 0.62 0.44 - 1.00 mg/dL   GFR calc non Af Amer >60 >60 mL/min   GFR calc Af Amer >60 >60 mL/min    Comment: (NOTE) The eGFR has been calculated using the CKD EPI equation. This calculation has not been validated in all clinical situations. eGFR's persistently <60 mL/min signify possible Chronic Kidney Disease.      HEENT: normal Cardio: RRR and no murmur Resp: CTA B/L and unlabored GI: BS positive and NT, ND Extremity:  Pulses positive and No Edema Skin:   Intact Neuro: Alert/Oriented and Normal Motor Musc/Skel:  Normal Gen NAD   Assessment/Plan: 1. Functional deficits secondary to debility which require 3+ hours per day of interdisciplinary therapy in a comprehensive inpatient rehab setting. Physiatrist is providing close team supervision and 24 hour management of active medical problems listed below. Physiatrist and rehab team continue to assess barriers to discharge/monitor patient progress toward functional and medical goals. FIM:        Function - Toileting Toileting steps completed by patient: Adjust clothing prior to toileting, Performs perineal hygiene, Adjust clothing after toileting Assist level: Supervision or verbal cues  Function - Toilet Transfers Toilet transfer assistive device: Elevated toilet seat/BSC over toilet, Walker Assist level to toilet: Supervision or verbal cues Assist level from toilet: Supervision or verbal cues        Function - Comprehension Comprehension: Auditory Comprehension assist level: Follows complex conversation/direction with no assist  Function - Expression Expression: Verbal Expression assist level: Expresses complex ideas: With no assist  Function - Social Interaction Social Interaction assist level: Interacts appropriately with others - No medications needed.  Function - Problem Solving Problem solving assist level: Solves complex problems: Recognizes & self-corrects  Function - Memory Memory assist level: Complete Independence: No helper  Medical Problem List and Plan: 1.  Deconditioning secondary to Takotsubo cardiomyopathy/cardiogenic shock and respiratory failure with aspiration pneumonia Start inpt PT, OT 2.  DVT Prophylaxis/Anticoagulation: Subcutaneous heparin. Monitor platelet counts and any signs of bleeding 3. Pain Management: Tylenol as needed 4. Mood: Provide emotional support 5. Neuropsych: This patient is capable of making decisions on her own behalf. 6. Skin/Wound Care: Routine skin checks 7. Fluids/Electrolytes/Nutrition: Routine I&O with follow-up chemistries 8. Hypertension. Lasix 40 mg daily, Coreg 3.125 mg twice a day, Cozaar 12.5 g daily. Monitor with increased mobility 9.Acute systolic congestive heart failure. Continue  Lasix therapy. No signs of fluid overload, 10. Staph aureus Tracheobronchitis. Complete course of doxycycline, robitussin for cough 11. Hyperlipidemia. Lipitor 12. Leukocytosis: Follow CBC. 13.  Constipation start  colace LOS (Days) 1 A FACE TO FACE EVALUATION WAS PERFORMED  Brenda Lloyd E 09/11/2017, 8:09 AM

## 2017-09-12 ENCOUNTER — Inpatient Hospital Stay (HOSPITAL_COMMUNITY): Payer: Medicare Other

## 2017-09-12 DIAGNOSIS — I5021 Acute systolic (congestive) heart failure: Secondary | ICD-10-CM

## 2017-09-12 DIAGNOSIS — J15211 Pneumonia due to Methicillin susceptible Staphylococcus aureus: Secondary | ICD-10-CM

## 2017-09-12 NOTE — Progress Notes (Signed)
Occupational Therapy Session Note  Patient Details  Name: LAKELYNN SEVERTSON MRN: [id number] Date of Birth: [birth year]  Today's Date: 09/12/2017 OT Individual Time: 1110-1140 OT Individual Time Calculation (min): 30 min    Short Term Goals: Week 1:  OT Short Term Goal 1 (Week 1): STGs = LTGs  Skilled Therapeutic Interventions/Progress Updates:    1:1. Pt in recliner upon arrival. Pt reporting minor difficulty with word finding and decreased endurance as her 2 biggest concerns. Pt ambulates with supervision and VC for posture/looking forward. Pt carries load of laundry in basket while ambulating, loads washer and dryer, and folds laundry in standing with no rest breaks. Pt reporting minor "tightness" in stomach with bending and OT educates on use of reacher to load/unload clothes for energy conservation to decrease bending. Pt sits for rest break and participates in game of "headbands" to work on word finding. Pt declines walking and playing at same time as it makes her "swimmy headed." Exited session with pt seated in recliner with call light in reach and all needs met.    Therapy Documentation Precautions:  Precautions Precautions: Fall Restrictions Weight Bearing Restrictions: No  See Function Navigator for Current Functional Status.   Therapy/Group: Individual Therapy  Tonny Branch 09/12/2017, 11:51 AM

## 2017-09-12 NOTE — Progress Notes (Signed)
Subjective/Complaints:  "partial" BM after sorbitol  ROS: No CP, SOB, N/V/D  Objective: Vital Signs: Blood pressure (!) 116/51, pulse 72, temperature 98.2 F (36.8 C), temperature source Oral, resp. rate 18, weight 84 kg (185 lb 3.2 oz), SpO2 96 %. No results found. Results for orders placed or performed during the hospital encounter of 09/10/17 (from the past 72 hour(s))  CBC     Status: Abnormal   Collection Time: 09/10/17  4:39 PM  Result Value Ref Range   WBC 9.7 4.0 - 10.5 K/uL   RBC 3.78 (L) 3.87 - 5.11 MIL/uL   Hemoglobin 10.7 (L) 12.0 - 15.0 g/dL   HCT 32.9 (L) 36.0 - 46.0 %   MCV 87.0 78.0 - 100.0 fL   MCH 28.3 26.0 - 34.0 pg   MCHC 32.5 30.0 - 36.0 g/dL   RDW 14.0 11.5 - 15.5 %   Platelets 269 150 - 400 K/uL  Creatinine, serum     Status: None   Collection Time: 09/10/17  4:39 PM  Result Value Ref Range   Creatinine, Ser 0.62 0.44 - 1.00 mg/dL   GFR calc non Af Amer >60 >60 mL/min   GFR calc Af Amer >60 >60 mL/min    Comment: (NOTE) The eGFR has been calculated using the CKD EPI equation. This calculation has not been validated in all clinical situations. eGFR's persistently <60 mL/min signify possible Chronic Kidney Disease.      HEENT: normal Cardio: RRR and no murmur Resp: CTA B/L and unlabored GI: BS positive and NT, ND Extremity:  Pulses positive and No Edema Skin:   Intact Neuro: Alert/Oriented and Normal Motor Musc/Skel:  Normal Gen NAD   Assessment/Plan: 1. Functional deficits secondary to debility which require 3+ hours per day of interdisciplinary therapy in a comprehensive inpatient rehab setting. Physiatrist is providing close team supervision and 24 hour management of active medical problems listed below. Physiatrist and rehab team continue to assess barriers to discharge/monitor patient progress toward functional and medical goals. FIM: Function - Bathing Position: Shower Body parts bathed by patient: Right arm, Left arm, Chest,  Abdomen, Front perineal area, Buttocks, Right upper leg, Left lower leg, Right lower leg, Left upper leg (stands in shower) Assist Level: Supervision or verbal cues  Function- Upper Body Dressing/Undressing What is the patient wearing?: Button up shirt Button up shirt - Perfomed by patient: Thread/unthread left sleeve, Pull shirt around back, Thread/unthread right sleeve, Button/unbutton shirt Assist Level: No help, No cues Function - Lower Body Dressing/Undressing What is the patient wearing?: Underwear, Pants, Shoes Position: Wheelchair/chair at sink Underwear - Performed by patient: Thread/unthread right underwear leg, Thread/unthread left underwear leg, Pull underwear up/down Pants- Performed by patient: Thread/unthread right pants leg, Thread/unthread left pants leg, Pull pants up/down Shoes - Performed by patient: Don/doff right shoe, Don/doff left shoe Assist for footwear: Supervision/touching assist Assist for lower body dressing: Supervision or verbal cues  Function - Toileting Toileting steps completed by patient: Adjust clothing prior to toileting, Performs perineal hygiene, Adjust clothing after toileting Assist level: Supervision or verbal cues  Function - Air cabin crew transfer assistive device: Elevated toilet seat/BSC over toilet, Walker Assist level to toilet: Supervision or verbal cues Assist level from toilet: Supervision or verbal cues  Function - Chair/bed transfer Chair/bed transfer method: Ambulatory Chair/bed transfer assist level: Touching or steadying assistance (Pt > 75%) Chair/bed transfer assistive device: Armrests  Function - Locomotion: Wheelchair Will patient use wheelchair at discharge?: No Function - Locomotion: Ambulation Assistive device: No  device Max distance: 250' Assist level: Touching or steadying assistance (Pt > 75%) Assist level: Touching or steadying assistance (Pt > 75%) Assist level: Touching or steadying assistance (Pt >  75%) Assist level: Touching or steadying assistance (Pt > 75%) Assist level: Touching or steadying assistance (Pt > 75%)  Function - Comprehension Comprehension: Auditory Comprehension assist level: Follows complex conversation/direction with no assist  Function - Expression Expression: Verbal Expression assist level: Expresses complex ideas: With no assist  Function - Social Interaction Social Interaction assist level: Interacts appropriately with others - No medications needed.  Function - Problem Solving Problem solving assist level: Solves complex problems: Recognizes & self-corrects  Function - Memory Memory assist level: Complete Independence: No helper Patient normally able to recall (first 3 days only): Current season, Location of own room, Staff names and faces, That he or she is in a hospital  Medical Problem List and Plan: 1.  Deconditioning secondary to Takotsubo cardiomyopathy/cardiogenic shock and respiratory failure with aspiration pneumonia Start inpt PT, OT 2.  DVT Prophylaxis/Anticoagulation: Subcutaneous heparin. Monitor platelet counts and any signs of bleeding 3. Pain Management: Tylenol as needed 4. Mood: Provide emotional support 5. Neuropsych: This patient is capable of making decisions on her own behalf. 6. Skin/Wound Care: Routine skin checks 7. Fluids/Electrolytes/Nutrition: Routine I&O with follow-up chemistries 8. Hypertension. Lasix 40 mg daily, Coreg 3.125 mg twice a day, Cozaar 12.5 g daily. Monitor with increased mobility Vitals:   09/11/17 1433 09/12/17 0500  BP: (!) 118/45 (!) 116/51  Pulse: 78 72  Resp: 20 18  Temp: 98.7 F (37.1 C) 98.2 F (36.8 C)  SpO2: 94% 96%     9.Acute systolic congestive heart failure. Continue Lasix therapy. No signs of fluid overload, 10. Staph aureus Tracheobronchitis. Complete course of doxycycline, robitussin for cough 11. Hyperlipidemia. Lipitor 12. Leukocytosis: Follow CBC. 13.  Constipation cont  sorbitol LOS (Days) 2 A FACE TO FACE EVALUATION WAS PERFORMED  KIRSTEINS,ANDREW E 09/12/2017, 7:40 AM

## 2017-09-12 NOTE — IPOC Note (Signed)
Overall Plan of Care [facility]) Patient Details Name: Brenda Lloyd MRN: [id number] DOB: Jul 27, 1951  Admitting Diagnosis: Debility  Hospital Problems: Active Problems:   Debility   Acute systolic heart failure (HCC)   Staphylococcus aureus pneumonia (HCC)     Functional Problem List: Nursing Edema, Endurance, Medication Management, Motor, Nutrition, Pain, Safety, Sensory, Skin Integrity, Bladder, Bowel  PT Endurance  OT Balance, Endurance  SLP    TR         Basic ADL's: OT Dressing, Bathing, Toileting     Advanced  ADL's: OT Simple Meal Preparation, Light Housekeeping     Transfers: PT Bed Mobility, Car, Bed to Chair, [employer], Floor  OT Tub/Shower, Toilet     Locomotion: PT Stairs, Ambulation     Additional Impairments: OT None  SLP        TR      Anticipated Outcomes Item Anticipated Outcome  Self Feeding independent  Swallowing      Basic self-care  independent  Toileting  independent   Bathroom Transfers independent  Bowel/Bladder  min assist  Transfers  Mod I   Locomotion  Mod I   Communication     Cognition     Pain  less<2  Safety/Judgment  min assist   Therapy Plan: PT Intensity: Minimum of 1-2 x/day ,45 to 90 minutes PT Frequency: 5 out of 7 days PT Duration Estimated Length of Stay: 3-4 days OT Intensity: Minimum of 1-2 x/day, 45 to 90 minutes OT Frequency: 5 out of 7 days OT Duration/Estimated Length of Stay: 3-4 days      Team Interventions: Nursing Interventions Patient/Family Education, Bladder Management, Bowel Management, Disease Management/Prevention, Pain Management, Medication Management, Skin Care/Wound Management, Discharge Planning, Psychosocial Support  PT interventions Ambulation/gait training, Disease management/prevention, Stair training, Therapeutic Activities, Patient/family education, DME/adaptive equipment instruction, Psychosocial support, Therapeutic Exercise, Functional mobility training, Community  reintegration, Discharge planning  OT Interventions Balance/vestibular training, Discharge planning, Functional mobility training, Self Care/advanced ADL retraining, Therapeutic Activities, Therapeutic Exercise  SLP Interventions    TR Interventions    SW/CM Interventions     Barriers to Discharge MD  Medical stability  Nursing      PT Medical stability, Home environment access/layout Pt desats to 90% on room air w/ activity   OT      SLP      SW       Team Discharge Planning: Destination: PT-Home ,OT- Home , SLP-  Projected Follow-up: PT-Outpatient PT, OT-  Other (comment) (cardiac rehab outpatient), SLP-  Projected Equipment Needs: PT-To be determined, OT- None recommended by OT, SLP-  Equipment Details: PT- , OT-  Patient/family involved in discharge planning: PT- Patient,  OT-Patient, SLP-   MD ELOS: 3-5 days. Medical Rehab Prognosis:  Excellent Assessment: 66 y.o.right handed femalewith history of hypertension, mitral valve prolapse. Per chart review, patient, and family, patient lives with spouse independent prior to admission one level home with 3 steps to entry. Presented 09/04/2017 with intermittent chest pain over a 5 day without radiation as well as shortness of breath and nausea. There was some concern of possible seizure with EEG completed that was negative Troponin elevated 3.15. EKG normal sinus rhythm with Q waves anteriorly, mild ST elevation. Oxygen saturations dipping into the 80s. Cardiac catheterization showed distal LAD lesion, 65% stenosed area and diffuse tapering from the mid vessel distally around the apex. The vessel had appearance of potentially diffuse intramural thrombus. Moderate to severe left ventricular systolic dysfunction. Ejection fraction 25-35%. Findings most consistent  with takotsubocardiomyopathy. Hospital course complicated by intubation for respiratory failure. A follow-up echocardiogram was completed showing ejection fraction of 35% no  evidence of thrombus. Current medical management with aspirin. She was found to have a seizure and started on Keppra and later discontinued due to somnolence. There was discussion of possible need for MRI of the brain, however, patient back to baseline and discontinued. Patient was staph aureus tracheobronchitis currently maintained on antibiotic coverage. She was extubated 09/06/2017. Physical and occupational therapy evaluations 09/08/2017 for deconditioning/impulsivitywith recommendations of physical medicine rehabilitation consult. Will set goals for Mod I/Ind with PT/OT.    See Team Conference Notes for weekly updates to the plan of care

## 2017-09-13 ENCOUNTER — Inpatient Hospital Stay (HOSPITAL_COMMUNITY): Payer: Medicare Other | Admitting: Occupational Therapy

## 2017-09-13 ENCOUNTER — Inpatient Hospital Stay (HOSPITAL_COMMUNITY): Payer: Medicare Other

## 2017-09-13 ENCOUNTER — Inpatient Hospital Stay (HOSPITAL_COMMUNITY): Payer: Medicare Other | Admitting: Physical Therapy

## 2017-09-13 DIAGNOSIS — I10 Essential (primary) hypertension: Secondary | ICD-10-CM

## 2017-09-13 DIAGNOSIS — D62 Acute posthemorrhagic anemia: Secondary | ICD-10-CM

## 2017-09-13 DIAGNOSIS — R7401 Elevation of levels of liver transaminase levels: Secondary | ICD-10-CM

## 2017-09-13 DIAGNOSIS — R740 Nonspecific elevation of levels of transaminase and lactic acid dehydrogenase [LDH]: Secondary | ICD-10-CM

## 2017-09-13 LAB — COMPREHENSIVE METABOLIC PANEL
ALT: 243 U/L — ABNORMAL HIGH (ref 14–54)
ANION GAP: 8 (ref 5–15)
AST: 189 U/L — AB (ref 15–41)
Albumin: 3 g/dL — ABNORMAL LOW (ref 3.5–5.0)
Alkaline Phosphatase: 89 U/L (ref 38–126)
BILIRUBIN TOTAL: 0.6 mg/dL (ref 0.3–1.2)
BUN: 7 mg/dL (ref 6–20)
CO2: 27 mmol/L (ref 22–32)
Calcium: 9 mg/dL (ref 8.9–10.3)
Chloride: 105 mmol/L (ref 101–111)
Creatinine, Ser: 0.58 mg/dL (ref 0.44–1.00)
Glucose, Bld: 109 mg/dL — ABNORMAL HIGH (ref 65–99)
POTASSIUM: 4.1 mmol/L (ref 3.5–5.1)
Sodium: 140 mmol/L (ref 135–145)
TOTAL PROTEIN: 6.6 g/dL (ref 6.5–8.1)

## 2017-09-13 LAB — CBC WITH DIFFERENTIAL/PLATELET
BASOS PCT: 1 %
Basophils Absolute: 0.1 10*3/uL (ref 0.0–0.1)
EOS PCT: 2 %
Eosinophils Absolute: 0.2 10*3/uL (ref 0.0–0.7)
HEMATOCRIT: 33.4 % — AB (ref 36.0–46.0)
Hemoglobin: 11.1 g/dL — ABNORMAL LOW (ref 12.0–15.0)
LYMPHS ABS: 2.4 10*3/uL (ref 0.7–4.0)
Lymphocytes Relative: 25 %
MCH: 29.1 pg (ref 26.0–34.0)
MCHC: 33.2 g/dL (ref 30.0–36.0)
MCV: 87.4 fL (ref 78.0–100.0)
MONO ABS: 0.8 10*3/uL (ref 0.1–1.0)
MONOS PCT: 8 %
NEUTROS ABS: 6 10*3/uL (ref 1.7–7.7)
Neutrophils Relative %: 64 %
PLATELETS: 328 10*3/uL (ref 150–400)
RBC: 3.82 MIL/uL — ABNORMAL LOW (ref 3.87–5.11)
RDW: 14.3 % (ref 11.5–15.5)
WBC: 9.5 10*3/uL (ref 4.0–10.5)

## 2017-09-13 MED ORDER — ATORVASTATIN CALCIUM 40 MG PO TABS
40.0000 mg | ORAL_TABLET | Freq: Every day | ORAL | Status: DC
Start: 2017-09-13 — End: 2017-09-14
  Administered 2017-09-13: 40 mg via ORAL
  Filled 2017-09-13: qty 1

## 2017-09-13 MED ORDER — ATORVASTATIN CALCIUM 40 MG PO TABS: 40.0000 mg | ORAL_TABLET | Freq: Every day | ORAL | Status: DC

## 2017-09-13 NOTE — Progress Notes (Signed)
Physical Therapy Discharge Summary  Patient Details  Name: Brenda Lloyd MRN: [id number] Date of Birth: 1951-02-26  Today's Date: 09/13/2017 PT Individual Time: 1601-0932 PT Individual Time Calculation (min): 60 min    Patient has met 5 of 5 long term goals due to improved activity tolerance, improved balance and increased strength.  Patient to discharge at an ambulatory level Modified Independent.    Reasons goals not met: All goals met  Recommendation:  Patient will benefit from ongoing skilled PT services in outpatient setting to continue to advance safe functional mobility, address ongoing impairments in endurance,  balance, and minimize fall risk.  Equipment: No equipment provided  Reasons for discharge: treatment goals met  Patient/family agrees with progress made and goals achieved: Yes   Treatment Interventions: Pt seated in recliner upon PT arrival, agreeable to therapy tx and denies pain. Session focused on functional mobility and dynamic standing balance. Pt ambulated within the unit >500 ft without AD and ascended/descended 12 steps using single handrail and step to pattern Mod I. Pt completed all transfers and bed mobility independently. Pt worked on dynamic balance while throwing horse shoes, standing with narrow base of support, reaching outside BOS to play volleyball, and hitting bean bag back and forth with hockey stick. Pt completed all dynamic high level balance activities Mod I to supervision. Berg balance test completed, pt scored 55/56 discussed the results of this test with the pt. Pt left seated in recliner at end of session with needs in reach.   PT Discharge Precautions/Restrictions Precautions Precautions: Fall Restrictions Weight Bearing Restrictions: No Vital Signs Therapy Vitals Temp: 98.6 F (37 C) Temp Source: Oral Pulse Rate: 73 Resp: 18 BP: (!) 116/55 Patient Position (if appropriate): Sitting Oxygen Therapy SpO2: 98 % O2 Device: Not  Delivered Cognition Overall Cognitive Status: Within Functional Limits for tasks assessed Arousal/Alertness: Awake/alert Orientation Level: Oriented X4 Selective Attention: Appears intact Memory: Appears intact Awareness: Appears intact Problem Solving: Appears intact Safety/Judgment: Appears intact Sensation Sensation Light Touch: Appears Intact Stereognosis: Appears Intact Hot/Cold: Appears Intact Proprioception: Appears Intact Coordination Gross Motor Movements are Fluid and Coordinated: Yes Fine Motor Movements are Fluid and Coordinated: Yes Motor  Motor Motor: Within Functional Limits  Trunk/Postural Assessment  Cervical Assessment Cervical Assessment: Within Functional Limits Thoracic Assessment Thoracic Assessment: Within Functional Limits Lumbar Assessment Lumbar Assessment: Within Functional Limits Postural Control Postural Control: Within Functional Limits  Balance Balance Balance Assessed: Yes Standardized Balance Assessment Standardized Balance Assessment: Berg Balance Test Berg Balance Test Sit to Stand: Able to stand without using hands and stabilize independently Standing Unsupported: Able to stand safely 2 minutes Sitting with Back Unsupported but Feet Supported on Floor or Stool: Able to sit safely and securely 2 minutes Stand to Sit: Sits safely with minimal use of hands Transfers: Able to transfer safely, minor use of hands Standing Unsupported with Eyes Closed: Able to stand 10 seconds safely Standing Ubsupported with Feet Together: Able to place feet together independently and stand 1 minute safely From Standing, Reach Forward with Outstretched Arm: Can reach confidently >25 cm (10") From Standing Position, Pick up Object from Floor: Able to pick up shoe safely and easily From Standing Position, Turn to Look Behind Over each Shoulder: Looks behind from both sides and weight shifts well Turn 360 Degrees: Able to turn 360 degrees safely in 4 seconds  or less Standing Unsupported, Alternately Place Feet on Step/Stool: Able to stand independently and safely and complete 8 steps in 20 seconds Standing Unsupported, One Foot  in [place]: Able to place foot tandem independently and hold 30 seconds Standing on One Leg: Able to lift leg independently and hold 5-10 seconds Total Score: 55 Static Standing Balance Static Standing - Balance Support: During functional activity Static Standing - Level of Assistance: 6: Modified independent (Device/Increase time) Dynamic Standing Balance Dynamic Standing - Level of Assistance: 6: Modified independent (Device/Increase time) Extremity Assessment  RUE Assessment RUE Assessment: Within Functional Limits LUE Assessment LUE Assessment: Within Functional Limits RLE Assessment RLE Assessment: Within Functional Limits LLE Assessment LLE Assessment: Within Functional Limits   See Function Navigator for Current Functional Status.  Netta Corrigan, PT, DPT 09/13/2017, 4:10 PM

## 2017-09-13 NOTE — Progress Notes (Signed)
Social Work  Social Work Assessment and Plan  Patient Details  Name: Brenda Lloyd MRN: [id number] Date of Birth: 11/21/1951  Today's Date: 09/13/2017  Problem List:  Patient Active Problem List   Diagnosis Date Noted  . Transaminitis   . Acute blood loss anemia   . Essential hypertension   . Acute systolic heart failure (HCC)   . Staphylococcus aureus pneumonia (HCC)   . Debility 09/10/2017  . CHF (congestive heart failure) (HCC)   . Leukocytosis   . Hyperlipidemia   . Benign essential HTN   . Flash pulmonary edema (HCC) 09/05/2017  . Acute respiratory failure with hypoxia (HCC) 09/05/2017  . Intubation of airway performed without difficulty   . Unstable angina (HCC)   . Cardiogenic shock (HCC)   . NSTEMI (non-ST elevated myocardial infarction) (HCC) 09/04/2017  . Systolic murmur 09/04/2017   Past Medical History:  Past Medical History:  Diagnosis Date  . Hypertension    Past Surgical History:  Past Surgical History:  Procedure Laterality Date  . LEFT HEART CATH AND CORONARY ANGIOGRAPHY N/A 09/05/2017   Procedure: LEFT HEART CATH AND CORONARY ANGIOGRAPHY;  Surgeon: [name], MD;  Location: [facility] INVASIVE CV LAB;  Service: Cardiovascular;  Laterality: N/A;   Social History:  reports that she has never smoked. She has never used smokeless tobacco. She reports that she drinks alcohol. She reports that she does not use drugs.  Family / Support Systems Marital Status: Married How Long?: 45 yrs Patient Roles: Parent, Spouse, Other (Comment) (grandparent) Spouse/Significant Other: Ron Solicitor @ (C) [phone] Anticipated Caregiver: spouse Ability/Limitations of Caregiver: none Caregiver Availability: 24/7 Family Dynamics: Pt's spouse very supportive and encouraging to pt and pleased with quick progress. They report that they have had custody of their 57 yo grandson and "he's the light of our lives..." Spouse denies any concerns about providing support for pt at  home.  Social History Preferred language: English Religion: Quaker Cultural Background: NA Education: college Read: Yes Write: Yes Employment Status: Retired Fish farm manager Issues: None Guardian/Conservator: None - per MD, pt is capable of making decisions on her own behalf.   Abuse/Neglect Physical Abuse: Denies Verbal Abuse: Denies Sexual Abuse: Denies Exploitation of patient/patient's resources: Denies Self-Neglect: Denies  Emotional Status Pt's affect, behavior adn adjustment status: Pt sitting up in chair and smiling throughout our interview.  She is very pleased to "finally see the social worker!Marland KitchenMarland KitchenMarland KitchenI'm closer to getting home!"  She describes her recovery as "a miracle" and very optimistic about her longer term recovery.  Already making plans to take grandson to [employer].  She feel she is "doing better than I expected."  Denies any significant emotional distress. - will monitor. Recent Psychosocial Issues: None recent Pyschiatric History: None Substance Abuse History: None  Patient / Family Perceptions, Expectations & Goals Pt/Family understanding of illness & functional limitations: Pt and spouse with good understanding of medicare/ cardiology course.  We discuss cardiac rehab and they are aware this will likely be recommended later after d/c. Premorbid pt/family roles/activities: Pt was active and completely independent.  Caring for their 63 month on grandson during the week. Anticipated changes in roles/activities/participation: Pt and spouse agree they will be limiting her activity to tolerance and have already placed younger grandson into daycare setting until she feels capable of taking his care on again. Pt/family expectations/goals: "I'd really like to take our grandson to [place] in the next few weeks."  [employer]: None Premorbid Home Care/DME Agencies: None  Transportation available at discharge: yes  Discharge  Planning Living Arrangements: Spouse/significant other, Other relatives Support Systems: Spouse/significant other, Children, Friends/neighbors Type of Residence: Private residence Insurance Resources: [employer] ([employer] Medicare) Financial Resources: Social Security Financial Screen Referred: No Living Expenses: Own Money Management: Spouse Does the patient have any problems obtaining your medications?: No Home Management: pt and spouse Patient/Family Preliminary Plans: Pt to d/c home with spouse providing any needed assistance. Social Work Anticipated Follow Up Needs: HH/OP Expected length of stay: 5 days  Clinical Impression Very pleasant woman here for debility following cardiogenic shock and newly diagnosed cardiomyopathy.  Making excellent gains and very short LOS anticipated.  Spouse very involved and able to assist if needed.  Will follow for d/c planning and support needs.  Torria Fromer 09/13/2017, 4:23 PM

## 2017-09-13 NOTE — Discharge Summary (Signed)
NAME:  Brenda Lloyd, Brenda Lloyd       ACCOUNT NO.:  [id number]  MEDICAL RECORD NO.:  [id number]  LOCATION:                                 FACILITY:  PHYSICIAN:  Maryla Morrow, MD        DATE OF BIRTH:  12/23/1951  DATE OF ADMISSION:  09/10/2017 DATE OF DISCHARGE:  09/14/2017                              DISCHARGE SUMMARY   DISCHARGE DIAGNOSES: 1. 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 2. Subcutaneous heparin for deep venous thrombosis prophylaxis. 3. Pain management. 4. Hypertension. 5. Acute systolic congestive heart failure. 6. Staphylococcus aureus tracheobronchitis. 7. Hyperlipidemia. 8. Constipation. 9. Transaminitis  HISTORY OF PRESENT ILLNESS:  This is a 66 year old right-handed female, history of hypertension, mitral valve prolapse, lives with spouse independent prior to admission.  Presented September 04, 2017, with intermittent chest pain, without radiation.  No shortness of breath. There was some concern of possible seizure.  EEG negative.  Troponin 3.15.  EKG, normal sinus rhythm.  Q-waves anteriorly, mild ST-elevation, oxygen saturations dipping into the 80s.  Cardiac catheterization showed distal LAD lesion, 65% stenosed area and diffuse tapering from the mid vessel distally around the apex.  Moderate-to-severe left ventricular systolic dysfunction, ejection fraction 25-35%.  Findings most consistent with takotsubo cardiomyopathy.  Hospital course complicated by intubation, respiratory failure.  Echocardiogram completed showing no evidence of thrombus.  Medical management with aspirin.  She was found to have seizure started on Keppra, later discontinued due to somnolence. There was discussion of possible need for MRI of the brain; however, the patient returned back to baseline and order was discontinued.  Staph aureus tracheobronchitis maintained on antibiotic coverage.  She was extubated  September 06, 2017.  Physical and occupational therapy ongoing.  The patient was admitted for a comprehensive rehab program.  PAST MEDICAL HISTORY:  See discharge diagnoses.  SOCIAL HISTORY:  Lives with spouse, independent prior to admission.  FUNCTIONAL STATUS:  Upon admission to rehab services was min-to-mod assist, 160 feet without assistive device; min assist, sit to stand; min- to-mod assist, activities of daily living.  PHYSICAL EXAMINATION:  VITAL SIGNS:  Blood pressure 127/74, pulse 76, temperature 98, and respirations 18. GENERAL:  Alert female, oriented to person, place, and time. EYES:  EOMs intact. NECK:  Supple.  Nontender.  No JVD. CARDIAC:  Rate controlled. ABDOMEN:  Soft, nontender.  Good bowel sounds. LUNGS:  Clear to auscultation without wheeze. MUSCULOSKELETAL:  Motor strength 4-/5, 4+/5 grossly throughout.  REHABILITATION HOSPITAL COURSE:  The patient was admitted to inpatient rehab services.  Therapies initiated on a 3-hour daily basis, consisting of physical therapy, occupational therapy, and rehabilitation nursing. The following issues were addressed during the patient's rehabilitation stay.  Pertaining to Brenda Lloyd's debilitation related to cardiomyopathy, cardiogenic shock, remained stable, maintained on aspirin therapy.  She would follow up with Cardiology Services. Subcutaneous heparin, no bleeding episodes.  Blood pressures controlled, monitored with Lasix as well as Cozaar.  She exhibited no other signs of fluid overload.  She was completing a course of doxycycline for Staph aureus tracheobronchitis, she remained afebrile. Patient with elevated LFTs felt to be related to statin drugs from Lipitor was initially  decreased to 40 mg later discontinued. Plan was for follow-up outpatient LFTs consider restarting statin at that time. The patient received weekly collaborative interdisciplinary team conferences to discuss estimated length of stay, family  teaching, any barriers to her discharge.  She was ambulating extended distances, assistive device as needed, monitoring of balance, supervision bed mobility, minimal guard sit to stand, ambulate up to 250 feet, gather belongings for activities of daily living and homemaking, was discussed no driving.  Full family teaching was completed and plan discharge to home.  DISCHARGE MEDICATIONS: 1. Aspirin 81 mg p.o. daily. 2. Coreg 3.125 mg p.o. b.i.d. 3. Lasix 40 mg p.o. daily. 4. Cozaar 12.5 mg p.o. daily. 5. Potassium chloride 20 mEq p.o. b.i.d. 6. Doxycycline 100 mg p.o. every 12 hours x2 days and stop.  DIET:  Low-sodium.  FOLLOWUP:  The patient would follow up with Dr. Maryla Morrow at the Outpatient Rehab Service office 1 month as directed; Dr. Royann Shivers, cardiology Services call for appointment; and Dr. [name] of [facility], medical management.  SPECIAL INSTRUCTION:  No driving.  Follow-up LFTs as outpatient with follow-up with PCP Dr. Angelita Ingles 09/21/2017.   Brenda Lloyd, P.A.   ______________________________ Maryla Morrow, MD    DA/MEDQ  D:  09/13/2017  T:  09/13/2017  Job:  [id number]  cc:   [name], MD Maryla Morrow, MD [name], MD

## 2017-09-13 NOTE — Progress Notes (Signed)
Occupational Therapy Session Note  Patient Details  Name: Brenda Lloyd MRN: [id number] Date of Birth: 1951-07-09  Today's Date: 09/13/2017 OT Individual Time: 0945-1100 OT Individual Time Calculation (min): 75 min    Short Term Goals: Week 1:  OT Short Term Goal 1 (Week 1): STGs = LTGs  Skilled Therapeutic Interventions/Progress Updates:    Pt seen for OT ADL bathing/dressing session and session focusing on community ambulation and functional activity tolerance. Pt sitting up in recliner upon arrival, ready for tx session and showering task. She completed ambulation throughout session at mod I/ I level. She gathered all items and showered and dressed from standing position.  She ambulated throughout hospital to coffee shop and throughout unit. She required intermittent seated rest breaks which she was able to self initiate. Educated throughout session regarding energy conservation, cardiac rehab, return to [employer] and community re-integration. Pt performing at mod I level with decreased activity tolerance biggest limitation, however, she is able to comfortably ambulate household and short distance community distances. CSW and PT made aware and plan for d/c home tomorrow. Pt's husband is home with her throughout the day for assist if needed. Pt excited about progress and feels ready for home. Pt returned to room at end of session, left seated in recliner with all needs in reach and husband present.   Therapy Documentation Precautions:  Precautions Precautions: Fall Restrictions Weight Bearing Restrictions: No Pain:   No/ denies pain ADL: ADL ADL Comments: refer to functional navigator  See Function Navigator for Current Functional Status.   Therapy/Group: Individual Therapy  Lewis, Godfrey Tritschler C 09/13/2017, 7:15 AM

## 2017-09-13 NOTE — Progress Notes (Signed)
Subjective/Complaints: Pt seen laying in bed this AM. She slept well overnight.  She denies complaints this AM.  She is doing well with therapies and wants to go home tomorrow.   ROS: Denies CP, SOB, N/V/D  Objective: Vital Signs: Blood pressure (!) 135/58, pulse 71, temperature 98.2 F (36.8 C), temperature source Oral, resp. rate 18, weight 83.9 kg (185 lb), SpO2 95 %. No results found. Results for orders placed or performed during the hospital encounter of 09/10/17 (from the past 72 hour(s))  CBC     Status: Abnormal   Collection Time: 09/10/17  4:39 PM  Result Value Ref Range   WBC 9.7 4.0 - 10.5 K/uL   RBC 3.78 (L) 3.87 - 5.11 MIL/uL   Hemoglobin 10.7 (L) 12.0 - 15.0 g/dL   HCT 32.9 (L) 36.0 - 46.0 %   MCV 87.0 78.0 - 100.0 fL   MCH 28.3 26.0 - 34.0 pg   MCHC 32.5 30.0 - 36.0 g/dL   RDW 14.0 11.5 - 15.5 %   Platelets 269 150 - 400 K/uL  Creatinine, serum     Status: None   Collection Time: 09/10/17  4:39 PM  Result Value Ref Range   Creatinine, Ser 0.62 0.44 - 1.00 mg/dL   GFR calc non Af Amer >60 >60 mL/min   GFR calc Af Amer >60 >60 mL/min    Comment: (NOTE) The eGFR has been calculated using the CKD EPI equation. This calculation has not been validated in all clinical situations. eGFR's persistently <60 mL/min signify possible Chronic Kidney Disease.   CBC WITH DIFFERENTIAL     Status: Abnormal (Preliminary result)   Collection Time: 09/13/17  5:05 AM  Result Value Ref Range   WBC 9.5 4.0 - 10.5 K/uL   RBC 3.82 (L) 3.87 - 5.11 MIL/uL   Hemoglobin 11.1 (L) 12.0 - 15.0 g/dL   HCT 33.4 (L) 36.0 - 46.0 %   MCV 87.4 78.0 - 100.0 fL   MCH 29.1 26.0 - 34.0 pg   MCHC 33.2 30.0 - 36.0 g/dL   RDW 14.3 11.5 - 15.5 %   Platelets 328 150 - 400 K/uL   Neutrophils Relative % PENDING %   Neutro Abs PENDING 1.7 - 7.7 K/uL   Band Neutrophils PENDING %   Lymphocytes Relative PENDING %   Lymphs Abs PENDING 0.7 - 4.0 K/uL   Monocytes Relative PENDING %   Monocytes Absolute  PENDING 0.1 - 1.0 K/uL   Eosinophils Relative PENDING %   Eosinophils Absolute PENDING 0.0 - 0.7 K/uL   Basophils Relative PENDING %   Basophils Absolute PENDING 0.0 - 0.1 K/uL   WBC Morphology PENDING    RBC Morphology PENDING    Smear Review PENDING    nRBC PENDING 0 /100 WBC   Metamyelocytes Relative PENDING %   Myelocytes PENDING %   Promyelocytes Absolute PENDING %   Blasts PENDING %  Comprehensive metabolic panel     Status: Abnormal   Collection Time: 09/13/17  5:05 AM  Result Value Ref Range   Sodium 140 135 - 145 mmol/L   Potassium 4.1 3.5 - 5.1 mmol/L   Chloride 105 101 - 111 mmol/L   CO2 27 22 - 32 mmol/L   Glucose, Bld 109 (H) 65 - 99 mg/dL   BUN 7 6 - 20 mg/dL   Creatinine, Ser 0.58 0.44 - 1.00 mg/dL   Calcium 9.0 8.9 - 10.3 mg/dL   Total Protein 6.6 6.5 - 8.1 g/dL   Albumin  3.0 (L) 3.5 - 5.0 g/dL   AST 189 (H) 15 - 41 U/L   ALT 243 (H) 14 - 54 U/L   Alkaline Phosphatase 89 38 - 126 U/L   Total Bilirubin 0.6 0.3 - 1.2 mg/dL   GFR calc non Af Amer >60 >60 mL/min   GFR calc Af Amer >60 >60 mL/min    Comment: (NOTE) The eGFR has been calculated using the CKD EPI equation. This calculation has not been validated in all clinical situations. eGFR's persistently <60 mL/min signify possible Chronic Kidney Disease.    Anion gap 8 5 - 15     HEENT: Normocephalic, atraumatic.  Cardio: RRR and no JVD. Resp: CTA B/L and unlabored GI: BS positive and ND Skin:   Intact. Warm and dry. Neuro: Alert and Oriented  Motor: 5/5 throughout Musc/Skel:  No edema, no tenderness. Gen NAD. Vital signs reviewed  Assessment/Plan: 1. Functional deficits secondary to debility which require 3+ hours per day of interdisciplinary therapy in a comprehensive inpatient rehab setting. Physiatrist is providing close team supervision and 24 hour management of active medical problems listed below. Physiatrist and rehab team continue to assess barriers to discharge/monitor patient progress  toward functional and medical goals. FIM: Function - Bathing Position: Shower Body parts bathed by patient: Right arm, Left arm, Chest, Abdomen, Front perineal area, Buttocks, Right upper leg, Left lower leg, Right lower leg, Left upper leg (stands in shower) Assist Level: Supervision or verbal cues  Function- Upper Body Dressing/Undressing What is the patient wearing?: Button up shirt Button up shirt - Perfomed by patient: Thread/unthread left sleeve, Pull shirt around back, Thread/unthread right sleeve, Button/unbutton shirt Assist Level: No help, No cues Function - Lower Body Dressing/Undressing What is the patient wearing?: Underwear, Pants, Shoes Position: Wheelchair/chair at sink Underwear - Performed by patient: Thread/unthread right underwear leg, Thread/unthread left underwear leg, Pull underwear up/down Pants- Performed by patient: Thread/unthread right pants leg, Thread/unthread left pants leg, Pull pants up/down Shoes - Performed by patient: Don/doff right shoe, Don/doff left shoe Assist for footwear: Supervision/touching assist Assist for lower body dressing: Supervision or verbal cues  Function - Toileting Toileting steps completed by patient: Adjust clothing prior to toileting, Performs perineal hygiene, Adjust clothing after toileting Assist level: Supervision or verbal cues  Function - Air cabin crew transfer assistive device: Elevated toilet seat/BSC over toilet, Walker Assist level to toilet: Supervision or verbal cues Assist level from toilet: Supervision or verbal cues  Function - Chair/bed transfer Chair/bed transfer method: Ambulatory Chair/bed transfer assist level: Touching or steadying assistance (Pt > 75%) Chair/bed transfer assistive device: Armrests  Function - Locomotion: Wheelchair Will patient use wheelchair at discharge?: No Function - Locomotion: Ambulation Assistive device: No device Max distance: 250' Assist level: Touching or  steadying assistance (Pt > 75%) Assist level: Touching or steadying assistance (Pt > 75%) Assist level: Touching or steadying assistance (Pt > 75%) Assist level: Touching or steadying assistance (Pt > 75%) Assist level: Touching or steadying assistance (Pt > 75%)  Function - Comprehension Comprehension: Auditory Comprehension assist level: Follows complex conversation/direction with no assist  Function - Expression Expression: Verbal Expression assist level: Expresses complex ideas: With no assist  Function - Social Interaction Social Interaction assist level: Interacts appropriately with others - No medications needed.  Function - Problem Solving Problem solving assist level: Solves complex problems: Recognizes & self-corrects  Function - Memory Memory assist level: Complete Independence: No helper Patient normally able to recall (first 3 days only): Current season, Location  of own room, Staff names and faces, That he or she is in a hospital  Medical Problem List and Plan: 1.  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  DVT Prophylaxis/Anticoagulation: Subcutaneous heparin. Monitor platelet counts and any signs of bleeding 3. Pain Management: Tylenol as needed 4. Mood: Provide emotional support 5. Neuropsych: This patient is capable of making decisions on her own behalf. 6. Skin/Wound Care: Routine skin checks 7. Fluids/Electrolytes/Nutrition: Routine I&Os 8. Hypertension. Lasix 40 mg daily, Coreg 3.125 mg twice a day, Cozaar 12.5 g daily. Monitor with increased mobility Vitals:   09/12/17 1400 09/13/17 0500  BP: (!) 111/49 (!) 135/58  Pulse: 81 71  Resp: 18 18  Temp: 98.1 F (36.7 C) 98.2 F (36.8 C)  SpO2: 94% 95%   Relatively controlled 9/17 9.Acute systolic congestive heart failure. Continue Lasix therapy. No signs of fluid overload, 10. Staph aureus Tracheobronchitis. Complete  course of doxycycline, robitussin for cough 11. Hyperlipidemia. Lipitor 12. Leukocytosis: Follow CBC. 13.  Constipation cont sorbitol 14. ABLA  Hb 11.1 on 9/17  Cont to monitor 15. Transaminitis  Lipitor decreased to 89m  Meds reviewed with pharmacy    LOS (Days) 3 A FACE TO FACE EVALUATION WAS PERFORMED  Ankit ALorie Phenix9/17/2018, 8:06 AM

## 2017-09-13 NOTE — Care Management Note (Signed)
Inpatient Rehabilitation Center Individual Statement of Services  Patient Name:  Brenda Lloyd  Date:  09/13/2017  Welcome to the Inpatient Rehabilitation Center.  Our goal is to provide you with an individualized program based on your diagnosis and situation, designed to meet your specific needs.  With this comprehensive rehabilitation program, you will be expected to participate in at least 3 hours of rehabilitation therapies Monday-Friday, with modified therapy programming on the weekends.  Your rehabilitation program will include the following services:  Physical Therapy (PT), Occupational Therapy (OT), 24 hour per day rehabilitation nursing, Therapeutic Recreaction (TR), Case Management (Social Worker), Rehabilitation Medicine, Nutrition Services and Pharmacy Services  Weekly team conferences will be held on Wednesdays to discuss your progress.  Your Social Worker will talk with you frequently to get your input and to update you on team discussions.  Team conferences with you and your family in attendance may also be held.  Expected length of stay: 5 days  Overall anticipated outcome: Independent to supervision  Depending on your progress and recovery, your program may change. Your Social Worker will coordinate services and will keep you informed of any changes. Your Social Worker's name and contact numbers are listed  below.  The following services may also be recommended but are not provided by the Inpatient Rehabilitation Center:   Driving Evaluations  Home Health Rehabiltiation Services  Outpatient Rehabilitation Services    Arrangements will be made to provide these services after discharge if needed.  Arrangements include referral to agencies that provide these services.  Your insurance has been verified to be:  [facility] Your primary doctor is:  [name]  Pertinent information will be shared with your doctor and your insurance company.  Social Worker:  [place], [place] [phone] or [phone]   Information discussed with and copy given to patient by: Amada Jupiter, 09/13/2017, 4:26 PM

## 2017-09-13 NOTE — Discharge Summary (Signed)
Discharge summary time [phone]

## 2017-09-13 NOTE — Progress Notes (Signed)
Patient information reviewed and entered into eRehab system by Derron Pipkins, RN, CRRN, PPS Coordinator.  Information including medical coding and functional independence measure will be reviewed and updated through discharge.     Per nursing patient was given "Data Collection Information Summary for Patients in Inpatient Rehabilitation Facilities with attached "Privacy Act Statement-Health Care Records" upon admission.  

## 2017-09-13 NOTE — Progress Notes (Signed)
Physical Therapy Session Note  Patient Details  Name: Brenda Lloyd MRN: [id number] Date of Birth: 10/18/1951  Today's Date: 09/13/2017 PT Individual Time: 0800-0900 PT Individual Time Calculation (min): 60 min   Short Term Goals: Week 1:  PT Short Term Goal 1 (Week 1): =LTGs due to ELOS  Skilled Therapeutic Interventions/Progress Updates:   Pt received lying in bed, agreeable to therapy. Pt supervision with all sit<>stand transfers throughout session. Pt performed hand and mouth hygiene with supervision and no cues. Pt performed toilet transfer and hygiene at mod I level. Pt performed rocker board r/l & f/b with perturbations with close supervision and occasional support with 1 UE. Pt request to do walking activities for remainder of session. Pt ambulated with supervision 200 ft, 200 ft, 500 ft, 500 ft, and 300 ft. Pt performed fast ambulating 330 ft x 3 with reciprocal UE movement. Pt O2 stats >93 throughout session, HR 96-101 bpm. Pt left sitting up in room recliner, hand off to RN.   Therapy Documentation Precautions:  Precautions Precautions: Fall Restrictions Weight Bearing Restrictions: No   See Function Navigator for Current Functional Status.   Therapy/Group: Individual Therapy  Joeseph Amor, SPT 09/13/2017, 10:10 AM

## 2017-09-13 NOTE — Progress Notes (Signed)
Occupational Therapy Discharge Summary  Patient Details  Name: Brenda Lloyd MRN: [id number] Date of Birth: 05/26/1951   Patient has met 8 of 8 long term goals due to improved activity tolerance, improved balance and postural control.  Patient to discharge at overall Independent level.  Patient does not require assist at d/c, however, her husband is present and able to provide assist if needed   Recommendation:  Patient with no further OT needs.   Equipment: No equipment provided  Reasons for discharge: treatment goals met and discharge from hospital  Patient/family agrees with progress made and goals achieved: Yes  OT Discharge Precautions/Restrictions  Precautions Precautions: Fall Restrictions Weight Bearing Restrictions: No ADL ADL ADL Comments: refer to functional navigator Vision Baseline Vision/History: Wears glasses Wears Glasses: At all times Vision Assessment?: No apparent visual deficits Cognition Overall Cognitive Status: Within Functional Limits for tasks assessed Arousal/Alertness: Awake/alert Orientation Level: Oriented X4 Memory: Appears intact Awareness: Appears intact Problem Solving: Appears intact Safety/Judgment: Appears intact Sensation Sensation Light Touch: Appears Intact Stereognosis: Appears Intact Hot/Cold: Appears Intact Proprioception: Appears Intact Coordination Gross Motor Movements are Fluid and Coordinated: Yes Fine Motor Movements are Fluid and Coordinated: Yes Motor  Motor Motor: Within Functional Limits Trunk/Postural Assessment  Cervical Assessment Cervical Assessment: Within Functional Limits Thoracic Assessment Thoracic Assessment: Within Functional Limits Lumbar Assessment Lumbar Assessment: Within Functional Limits Postural Control Postural Control: Within Functional Limits  Balance Balance Balance Assessed: Yes Static Standing Balance Static Standing - Balance Support: During functional activity Static  Standing - Level of Assistance: 6: Modified independent (Device/Increase time) Dynamic Standing Balance Dynamic Standing - Level of Assistance: 6: Modified independent (Device/Increase time) Extremity/Trunk Assessment RUE Assessment RUE Assessment: Within Functional Limits LUE Assessment LUE Assessment: Within Functional Limits   See Function Navigator for Current Functional Status.  Lewis,  C 09/13/2017, 3:31 PM 

## 2017-09-14 ENCOUNTER — Inpatient Hospital Stay (HOSPITAL_COMMUNITY): Payer: Medicare Other

## 2017-09-14 ENCOUNTER — Inpatient Hospital Stay (HOSPITAL_COMMUNITY): Payer: Medicare Other | Admitting: Physical Therapy

## 2017-09-14 ENCOUNTER — Inpatient Hospital Stay (HOSPITAL_COMMUNITY): Payer: Medicare Other | Admitting: Occupational Therapy

## 2017-09-14 DIAGNOSIS — I5181 Takotsubo syndrome: Secondary | ICD-10-CM

## 2017-09-14 LAB — COMPREHENSIVE METABOLIC PANEL
ALBUMIN: 3 g/dL — AB (ref 3.5–5.0)
ALT: 214 U/L — ABNORMAL HIGH (ref 14–54)
ANION GAP: 9 (ref 5–15)
AST: 157 U/L — ABNORMAL HIGH (ref 15–41)
Alkaline Phosphatase: 94 U/L (ref 38–126)
BUN: 8 mg/dL (ref 6–20)
CHLORIDE: 105 mmol/L (ref 101–111)
CO2: 24 mmol/L (ref 22–32)
Calcium: 9.2 mg/dL (ref 8.9–10.3)
Creatinine, Ser: 0.67 mg/dL (ref 0.44–1.00)
GFR calc Af Amer: >60 mL/min (ref >60)
GFR calc non Af Amer: >60 mL/min (ref >60)
GLUCOSE: 104 mg/dL — AB (ref 65–99)
POTASSIUM: 4.3 mmol/L (ref 3.5–5.1)
SODIUM: 138 mmol/L (ref 135–145)
Total Bilirubin: 0.9 mg/dL (ref 0.3–1.2)
Total Protein: 6.5 g/dL (ref 6.5–8.1)

## 2017-09-14 MED ORDER — DOXYCYCLINE HYCLATE 100 MG PO TABS: 100.0000 mg | ORAL_TABLET | Freq: Two times a day (BID) | ORAL | 0 refills | Status: DC

## 2017-09-14 MED ORDER — HYDROCODONE-HOMATROPINE 5-1.5 MG/5ML PO SYRP: 5.0000 mL | ORAL_SOLUTION | Freq: Four times a day (QID) | ORAL | 0 refills | Status: DC | PRN

## 2017-09-14 MED ORDER — FUROSEMIDE 40 MG PO TABS: 40.0000 mg | ORAL_TABLET | Freq: Every day | ORAL | 3 refills | Status: DC

## 2017-09-14 MED ORDER — LOSARTAN POTASSIUM 25 MG PO TABS: 12.5000 mg | ORAL_TABLET | Freq: Every day | ORAL | 1 refills | Status: DC

## 2017-09-14 MED ORDER — CARVEDILOL 3.125 MG PO TABS
3.1250 mg | ORAL_TABLET | Freq: Two times a day (BID) | ORAL | 11 refills | Status: DC
Start: 2017-09-14 — End: 2017-10-13

## 2017-09-14 MED ORDER — POTASSIUM CHLORIDE CRYS ER 20 MEQ PO TBCR: 20.0000 meq | EXTENDED_RELEASE_TABLET | Freq: Two times a day (BID) | ORAL | 0 refills | Status: DC

## 2017-09-14 MED ORDER — ATORVASTATIN CALCIUM 40 MG PO TABS: 40.0000 mg | ORAL_TABLET | Freq: Every day | ORAL | 1 refills | Status: DC

## 2017-09-14 NOTE — Progress Notes (Signed)
Please schedule for limited echo for wall motion and EF in about 3 weeks (for acute systolic HF) and then clinic follow up in 4 weeks. MCr

## 2017-09-14 NOTE — Discharge Instructions (Signed)
Inpatient Rehab Discharge Instructions  Brenda Lloyd Discharge date and time: No discharge date for patient encounter.   Activities/Precautions/ Functional Status: Activity: activity as tolerated Diet: regular diet Wound Care: none needed Functional status:  ___ No restrictions     ___ Walk up steps independently ___ 24/7 supervision/assistance   ___ Walk up steps with assistance ___ Intermittent supervision/assistance  ___ Bathe/dress independently ___ Walk with walker     _x__ Bathe/dress with assistance ___ Walk Independently    ___ Shower independently ___ Walk with assistance    ___ Shower with assistance ___ No alcohol     ___ Return to work/school ________    COMMUNITY REFERRALS UPON DISCHARGE:    Outpatient: PT                    Agency:  [facility] [place] Phone: [phone]                Appointment Date/Time:  09/16/17 @ 2:00 pm (please arrive @ 1:45 pm)        Special Instructions: No driving  Follow-up outpatient LFTs with results to Dr.Tameka Providence Lanius 09/21/[phone]   My questions have been answered and I understand these instructions. I will adhere to these goals and the provided educational materials after my discharge from the hospital.  Patient/Caregiver Signature _______________________________ Date __________  Clinician Signature _______________________________________ Date __________  Please bring this form and your medication list with you to all your follow-up doctor's appointments.

## 2017-09-14 NOTE — Progress Notes (Signed)
Subjective/Complaints: Pt seen laying in bed this AM.  She slept well overnight.  She has questions regarding her statin and LFTs. She is dressed and awaiting discharge.   ROS: Denies CP, SOB, N/V/D  Objective: Vital Signs: Blood pressure (!) 118/54, pulse 70, temperature 98.2 F (36.8 C), temperature source Oral, resp. rate 18, weight 82.6 kg (182 lb), SpO2 95 %. No results found. Results for orders placed or performed during the hospital encounter of 09/10/17 (from the past 72 hour(s))  CBC WITH DIFFERENTIAL     Status: Abnormal   Collection Time: 09/13/17  5:05 AM  Result Value Ref Range   WBC 9.5 4.0 - 10.5 K/uL   RBC 3.82 (L) 3.87 - 5.11 MIL/uL   Hemoglobin 11.1 (L) 12.0 - 15.0 g/dL   HCT 33.4 (L) 36.0 - 46.0 %   MCV 87.4 78.0 - 100.0 fL   MCH 29.1 26.0 - 34.0 pg   MCHC 33.2 30.0 - 36.0 g/dL   RDW 14.3 11.5 - 15.5 %   Platelets 328 150 - 400 K/uL   Neutrophils Relative % 64 %   Lymphocytes Relative 25 %   Monocytes Relative 8 %   Eosinophils Relative 2 %   Basophils Relative 1 %   Neutro Abs 6.0 1.7 - 7.7 K/uL   Lymphs Abs 2.4 0.7 - 4.0 K/uL   Monocytes Absolute 0.8 0.1 - 1.0 K/uL   Eosinophils Absolute 0.2 0.0 - 0.7 K/uL   Basophils Absolute 0.1 0.0 - 0.1 K/uL   WBC Morphology ATYPICAL LYMPHOCYTES   Comprehensive metabolic panel     Status: Abnormal   Collection Time: 09/13/17  5:05 AM  Result Value Ref Range   Sodium 140 135 - 145 mmol/L   Potassium 4.1 3.5 - 5.1 mmol/L   Chloride 105 101 - 111 mmol/L   CO2 27 22 - 32 mmol/L   Glucose, Bld 109 (H) 65 - 99 mg/dL   BUN 7 6 - 20 mg/dL   Creatinine, Ser 0.58 0.44 - 1.00 mg/dL   Calcium 9.0 8.9 - 10.3 mg/dL   Total Protein 6.6 6.5 - 8.1 g/dL   Albumin 3.0 (L) 3.5 - 5.0 g/dL   AST 189 (H) 15 - 41 U/L   ALT 243 (H) 14 - 54 U/L   Alkaline Phosphatase 89 38 - 126 U/L   Total Bilirubin 0.6 0.3 - 1.2 mg/dL   GFR calc non Af Amer >60 >60 mL/min   GFR calc Af Amer >60 >60 mL/min    Comment: (NOTE) The eGFR has been  calculated using the CKD EPI equation. This calculation has not been validated in all clinical situations. eGFR's persistently <60 mL/min signify possible Chronic Kidney Disease.    Anion gap 8 5 - 15  Comprehensive metabolic panel     Status: Abnormal   Collection Time: 09/14/17  6:48 AM  Result Value Ref Range   Sodium 138 135 - 145 mmol/L   Potassium 4.3 3.5 - 5.1 mmol/L   Chloride 105 101 - 111 mmol/L   CO2 24 22 - 32 mmol/L   Glucose, Bld 104 (H) 65 - 99 mg/dL   BUN 8 6 - 20 mg/dL   Creatinine, Ser 0.67 0.44 - 1.00 mg/dL   Calcium 9.2 8.9 - 10.3 mg/dL   Total Protein 6.5 6.5 - 8.1 g/dL   Albumin 3.0 (L) 3.5 - 5.0 g/dL   AST 157 (H) 15 - 41 U/L   ALT 214 (H) 14 - 54 U/L  Alkaline Phosphatase 94 38 - 126 U/L   Total Bilirubin 0.9 0.3 - 1.2 mg/dL   GFR calc non Af Amer >60 >60 mL/min   GFR calc Af Amer >60 >60 mL/min    Comment: (NOTE) The eGFR has been calculated using the CKD EPI equation. This calculation has not been validated in all clinical situations. eGFR's persistently <60 mL/min signify possible Chronic Kidney Disease.    Anion gap 9 5 - 15     HEENT: Normocephalic, atraumatic.  Cardio: RRR and no JVD. Resp: CTA B/L and Unlabored GI: BS positive and ND Skin:   Intact. Warm and dry. Neuro: Alert and Oriented  Motor: 5/5 throughout Musc/Skel:  No edema, no tenderness. Gen: NAD. Vital signs reviewed  Assessment/Plan: 1. Functional deficits secondary to debility which require 3+ hours per day of interdisciplinary therapy in a comprehensive inpatient rehab setting. Physiatrist is providing close team supervision and 24 hour management of active medical problems listed below. Physiatrist and rehab team continue to assess barriers to discharge/monitor patient progress toward functional and medical goals. FIM: Function - Bathing Position: Shower Body parts bathed by patient: Right arm, Left arm, Chest, Abdomen, Front perineal area, Buttocks, Right upper leg,  Left lower leg, Right lower leg, Left upper leg, Back Assist Level: No help, No cues  Function- Upper Body Dressing/Undressing What is the patient wearing?: Pull over shirt/dress Pull over shirt/dress - Perfomed by patient: Thread/unthread right sleeve, Thread/unthread left sleeve, Put head through opening, Pull shirt over trunk Button up shirt - Perfomed by patient: Thread/unthread left sleeve, Pull shirt around back, Thread/unthread right sleeve, Button/unbutton shirt Assist Level: No help, No cues Function - Lower Body Dressing/Undressing What is the patient wearing?: Underwear, Pants, Shoes Position: Other (comment) (standing in bathroom) Underwear - Performed by patient: Thread/unthread right underwear leg, Thread/unthread left underwear leg, Pull underwear up/down Pants- Performed by patient: Thread/unthread right pants leg, Thread/unthread left pants leg, Pull pants up/down Shoes - Performed by patient: Don/doff right shoe, Don/doff left shoe Assist for footwear: Independent Assist for lower body dressing: No Help, No cues  Function - Toileting Toileting steps completed by patient: Adjust clothing prior to toileting, Performs perineal hygiene, Adjust clothing after toileting Assist level: No help/no cues  Function - Air cabin crew transfer assistive device: Elevated toilet seat/BSC over toilet, Walker Assist level to toilet: No Help, No cues Assist level from toilet: No Help, No cues  Function - Chair/bed transfer Chair/bed transfer method: Ambulatory Chair/bed transfer assist level: No help, no cues Chair/bed transfer assistive device: Armrests  Function - Locomotion: Wheelchair Will patient use wheelchair at discharge?: No Function - Locomotion: Ambulation Assistive device: No device Max distance: 500 ft Assist level: No help, No cues, assistive device, takes more than a reasonable amount of time Assist level: No help, No cues, assistive device, takes more than  a reasonable amount of time Assist level: No help, No cues, assistive device, takes more than a reasonable amount of time Assist level: No help, No cues, assistive device, takes more than a reasonable amount of time Assist level: No help, No cues, assistive device, takes more than a reasonable amount of time  Function - Comprehension Comprehension: Auditory, Visual Comprehension assist level: Follows complex conversation/direction with no assist  Function - Expression Expression: Verbal Expression assist level: Expresses complex ideas: With no assist  Function - Social Interaction Social Interaction assist level: Interacts appropriately with others - No medications needed.  Function - Problem Solving Problem solving assist level: Solves complex  problems: Recognizes & self-corrects  Function - Memory Memory assist level: Complete Independence: No helper Patient normally able to recall (first 3 days only): Current season, That he or she is in a hospital  Medical Problem List and Plan: 1.  Deconditioning secondary to Takotsubo cardiomyopathy/cardiogenic shock and respiratory failure with aspiration pneumonia  D/c today  Will see patient for transitional care management in 1-2 weeks 2.  DVT Prophylaxis/Anticoagulation: Subcutaneous heparin. Monitor platelet counts and any signs of bleeding 3. Pain Management: Tylenol as needed 4. Mood: Provide emotional support 5. Neuropsych: This patient is capable of making decisions on her own behalf. 6. Skin/Wound Care: Routine skin checks 7. Fluids/Electrolytes/Nutrition: Routine I&Os 8. Hypertension. Lasix 40 mg daily, Coreg 3.125 mg twice a day, Cozaar 12.5 g daily. Monitor with increased mobility Vitals:   09/13/17 1436 09/14/17 0600  BP: (!) 116/55 (!) 118/54  Pulse: 73 70  Resp: 18 18  Temp: 98.6 F (37 C) 98.2 F (36.8 C)  SpO2: 98% 95%   Relatively controlled 9/18 9.Acute systolic congestive heart failure. Continue Lasix therapy.  No signs of fluid overload, 10. Staph aureus Tracheobronchitis. Complete course of doxycycline, robitussin for cough 11. Hyperlipidemia. Lipitor 12. Leukocytosis: Resolved. 13.  Constipation cont sorbitol 14. ABLA  Hb 11.1 on 9/17  Cont to monitor 15. Transaminitis  Lipitor d/ced 9/17  Meds reviewed with pharmacy  LFTs trending down, consider restarting statin as outpt    LOS (Days) 4 A FACE TO FACE EVALUATION WAS PERFORMED  Shainna Faux Lorie Phenix 09/14/2017, 8:05 AM

## 2017-09-14 NOTE — Progress Notes (Signed)
Social Work  Discharge Note  The overall goal for the admission was met for:   Discharge location: Yes - home with spouse who can provide 24/7 support  Length of Stay: Yes - 4 days  Discharge activity level: Yes - modified independent  Home/community participation: Yes  Services provided included: MD, RD, PT, OT, RN, Pharmacy and [place]: [facility] Medicare  Follow-up services arranged: Outpatient: PT via [facility] and Patient/Family has no preference for HH/DME agencies  Comments (or additional information):  Patient/Family verbalized understanding of follow-up arrangements: Yes  Individual responsible for coordination of the follow-up plan: pt  Confirmed correct DME delivered: NA   Brenda Lloyd

## 2017-09-14 NOTE — Progress Notes (Signed)
Discharged home per wheelchair with possessions accompanied by husband. Taken out by NT

## 2017-09-14 NOTE — Patient Care Conference (Signed)
Inpatient RehabilitationTeam Conference and Plan of Care Update Date: 09/14/2017   Time: 8:00 AM    Patient Name: Brenda Lloyd      Medical Record Number: [id number]  Date of Birth: 01/26/1951 Sex: Female         Room/Bed: 4M04C/4M04C-01 Payor Info: Payor: Theme park manager MEDICARE / Plan: UHC MEDICARE / Product Type: *No Product type* /    Admitting Diagnosis: Debility  Admit Date/Time:  09/10/2017  3:58 PM Admission Comments: No comment available   Primary Diagnosis:  <principal problem not specified> Principal Problem: <principal problem not specified>  Patient Active Problem List   Diagnosis Date Noted  . Takotsubo syndrome   . Transaminitis   . Acute blood loss anemia   . Essential hypertension   . Acute systolic heart failure ([place])   . Staphylococcus aureus pneumonia ([place])   . Debility 09/10/2017  . CHF (congestive heart failure) ([place])   . Leukocytosis   . Hyperlipidemia   . Benign essential HTN   . Flash pulmonary edema ([place]) 09/05/2017  . Acute respiratory failure with hypoxia ([place]) 09/05/2017  . Intubation of airway performed without difficulty   . Unstable angina ([place])   . Cardiogenic shock ([place])   . NSTEMI (non-ST elevated myocardial infarction) ([place]) 09/04/2017  . Systolic murmur 31/49/7026    Expected Discharge Date: Expected Discharge Date: 09/14/17  Team Members Present: Physician leading conference: Dr. Delice Lesch Social Worker Present: Lennart Pall, LCSW Nurse Present: Other (comment) Theador Hawthorne, RN) PT Present: Michaelene Song, PT OT Present: Napoleon Form, OT     Current Status/Progress Goal Weekly Team Focus  Medical   Deconditioning secondary to Takotsubo cardiomyopathy/cardiogenic shock and respiratory failure with aspiration pneumonia  Improve mobility, safety, BP, LFTs  See above   Bowel/Bladder   Patient is continent of bowel and bladder  min assist  monitor continentcy and ensure min assist with BRP   Swallow/Nutrition/ Hydration            ADL's   Mod I- Independent overall  Independent  tx goals met, pt to d/c home with husband   Mobility   Mod I- independent overall   Mod I  tx goals met, pt to d/c home with husband   Communication             Safety/Cognition/ Behavioral Observations  Patient remians free from injury   monitor and implement pt, safety      Pain    pain level at 0  assess pain   implement pain interventions per md order    Skin   skin intact and free from infection  assess skin  remains intact and free from injury    Rehab Goals Patient on target to meet rehab goals: Yes *See Care Plan and progress notes for long and short-term goals.     Barriers to Discharge  Current Status/Progress Possible Resolutions Date Resolved   Physician    Medical stability     See above  Therapies, follow BP, Lipitor d/ced - follow LFTs      Nursing                  PT  Medical stability;Home environment access/layout  Pt desats to 90% on room air w/ activity               OT                  SLP  SW                Discharge Planning/Teaching Needs:  Home with husband able to provide 24/7 supervision/ assist.  NA - mod ind goals   Team Discussion:  Pt has made excellent gains and already met her mod ind goals - ready for d/c today with OPPT arranged.  Revisions to Treatment Plan:  None    Continued Need for Acute Rehabilitation Level of Care: The patient requires daily medical management by a physician with specialized training in physical medicine and rehabilitation for the following conditions: Daily direction of a multidisciplinary physical rehabilitation program to ensure safe treatment while eliciting the highest outcome that is of practical value to the patient.: Yes Daily medical management of patient stability for increased activity during participation in an intensive rehabilitation regime.: Yes Daily analysis of laboratory values and/or radiology reports with any  subsequent need for medication adjustment of medical intervention for : Cardiac problems;Other;Blood pressure problems  Brenda Lloyd 09/14/2017, 8:00 AM

## 2017-09-15 ENCOUNTER — Telehealth: Payer: Self-pay

## 2017-09-15 DIAGNOSIS — I5021 Acute systolic (congestive) heart failure: Secondary | ICD-10-CM

## 2017-09-15 NOTE — Telephone Encounter (Signed)
Croitoru, Mihai, MD at 09/14/2017 10:53 AM   Status: Signed    Please schedule for limited echo for wall motion and EF in about 3 weeks (for acute systolic HF) and then clinic follow up in 4 weeks. MCr

## 2017-09-16 ENCOUNTER — Ambulatory Visit: Payer: Self-pay | Admitting: Physical Therapy

## 2017-09-16 NOTE — Telephone Encounter (Signed)
Attempted to contact patient x2.  Cell # - mailbox full Home # - line busy  Limited echo ordered. Message routed to admin for scheduling.

## 2017-09-17 ENCOUNTER — Telehealth (HOSPITAL_COMMUNITY): Payer: Self-pay | Admitting: Cardiovascular Disease

## 2017-09-22 ENCOUNTER — Encounter: Payer: Self-pay | Admitting: Rehabilitative and Restorative Service Providers"

## 2017-09-22 ENCOUNTER — Ambulatory Visit (INDEPENDENT_AMBULATORY_CARE_PROVIDER_SITE_OTHER): Payer: Medicare Other | Admitting: Rehabilitative and Restorative Service Providers"

## 2017-09-22 DIAGNOSIS — M6281 Muscle weakness (generalized): Secondary | ICD-10-CM | POA: Diagnosis not present

## 2017-09-22 DIAGNOSIS — R5381 Other malaise: Secondary | ICD-10-CM | POA: Diagnosis not present

## 2017-09-22 NOTE — Therapy (Addendum)
[facility] [place] [address] [place], [place], [address] Phone: [phone]   Fax:  [phone]  Physical Therapy Evaluation  Patient Details  Name: Brenda Lloyd MRN: [id number] Date of Birth: [birth year] Referring Provider: Dr Posey Pronto   Encounter Date: 09/22/2017      PT End of Session - 09/22/17 1010    Visit Number 1   Number of Visits 6   Date for PT Re-Evaluation 11/03/17   PT Start Time 0926   PT Stop Time 1013   PT Time Calculation (min) 47 min   Activity Tolerance Patient tolerated treatment well      Past Medical History:  Diagnosis Date  . Hypertension     Past Surgical History:  Procedure Laterality Date  . LEFT HEART CATH AND CORONARY ANGIOGRAPHY N/A 09/05/2017   Procedure: LEFT HEART CATH AND CORONARY ANGIOGRAPHY;  Surgeon: Leonie Man, MD;  Location: [facility];  Service: Cardiovascular;  Laterality: N/A;    There were no vitals filed for this visit.       Subjective Assessment - 09/22/17 0927    Subjective Patient reports that she had an MI ~ 09/05/17 and was hospitalized for two weeks and has been discharged ~ 1 week. She was physically fit before the MI and very active. She has some trouble with endurance.    Pertinent History denies any medical problems, "broken heart symdrome"   Diagnostic tests multiple tests in the hospital    Patient Stated Goals increased endurance and stamina; drive again; return to normal activities    Currently in Pain? No/denies            [facility] PT Assessment - 09/22/17 0001      Assessment   Medical Diagnosis Decreased endurance/activity level   Referring Provider Dr Posey Pronto    Onset Date/Surgical Date 09/05/17   Hand Dominance Right   Next MD Visit 10/18   Prior Therapy rehab in hospital x 3 days      Precautions   Precautions None   Precaution Comments no driving; no caffine      Balance Screen   Has the patient fallen in the past 6 months  No   Has the patient had a decrease in activity level because of a fear of falling?  No   Is the patient reluctant to leave their home because of a fear of falling?  No     Home Ecologist residence   Additional Comments single level home - 3 steps into home      Prior Function   Level of [place] Retired   Leisure keeps her grandson 3 years old- household chores; yard work     Designer, fashion/clothing Comments WFL's      Posture/Postural Control   Posture Comments head forward posture      AROM   Overall AROM Comments WFL's bilat U/LE's      Strength   Overall Strength Comments 5/5 bilat U/LE's     6 Minute Walk- Baseline   BP (mmHg) 97/61   HR (bpm) 67   Perceived Rate of Exertion (Borg) 6-     6 Minute walk- Post Test   BP (mmHg) (!)  177/159  returned to pre test level in 3 min post test   HR (bpm) 154  returned to 68 in 3 minutes post test    Perceived Rate of Exertion (Borg) 12-  6 minute walk test results    Aerobic Endurance Distance Walked 960  feet             Objective measurements completed on examination: See above findings.                  PT Education - 18-Oct-2017 1007    Education provided Yes   Education Details HEP    Person(s) Educated Patient   Methods Explanation;Demonstration;Tactile cues;Verbal cues;Handout   Comprehension Verbalized understanding;Returned demonstration;Verbal cues required;Tactile cues required             PT Long Term Goals - 10-18-2017 1155      PT LONG TERM GOAL #1   Title Patient reports return to normal functional activity level 11/03/17   Time 6   Period Weeks   Status New     PT LONG TERM GOAL #2   Title Patient participating in a regular walking program at least 3x/wk fir 20-25 min with minimal fatigue 11/03/17   Time 6   Period Weeks   Status New     PT LONG TERM GOAL #3   Title Independent in HEP 11/03/17   Time 6    Period Weeks   Status New                Plan - 10/18/2017 [place] presents s/p MI 09/05/17 with significant limitations in endurance and activity tolerance. She has elevation of BP and HR with 6 minute walk test with increased Borg (perceived exertion). Patient will benefit from PT to improve functional activity level and endurance, returning to active lifestyle and exercise program.    Clinical Presentation due to: on an ACE inhibitor - Target heart rate for 66 yo = 80% 123.2; 65% 100.1; 45% 69.3       Patient will benefit from skilled therapeutic intervention in order to improve the following deficits and impairments:  Cardiopulmonary status limiting activity, Decreased activity tolerance, Decreased endurance  Visit Diagnosis: Debility - Plan: PT plan of care cert/re-cert  Muscle weakness (generalized) - Plan: PT plan of care cert/re-cert      [facility] PT PB G-CODES - 10/18/2017 1147    Functional Assessment Tool Used  Evaluation; 6 min walk test; vital sign changes; clinical assessment   Functional Limitations Mobility: Walking and moving around   Mobility: Walking and Moving Around Current Status At least 60 percent but less than 80 percent impaired, limited or restricted   Mobility: Walking and Moving Around Goal Status [phone]) At least 40 percent but less than 60 percent impaired, limited or restricted       Problem List Patient Active Problem List   Diagnosis Date Noted  . Takotsubo syndrome   . Transaminitis   . Acute blood loss anemia   . Essential hypertension   . Acute systolic heart failure ([place])   . Staphylococcus aureus pneumonia ([place])   . Debility 09/10/2017  . CHF (congestive heart failure) ([place])   . Leukocytosis   . Hyperlipidemia   . Benign essential HTN   . Flash pulmonary edema ([place]) 09/05/2017  . Acute respiratory failure with hypoxia ([place]) 09/05/2017  . Intubation of airway performed without difficulty   .  Unstable angina ([place])   . Cardiogenic shock ([place])   . NSTEMI (non-ST elevated myocardial infarction) ([place]) 09/04/2017  . Systolic murmur 30/16/0109    Jaskarn Schweer Nilda Simmer PT, MPH  2017/10/18, 1:37 PM  [facility] [phone]  [place], [place], [address] Phone: [phone]   Fax:  [phone]  Name: Brenda Lloyd MRN: [id number] Date of Birth: 01-15-1951  PHYSICAL THERAPY DISCHARGE SUMMARY  Visits from Start of Care: Eval only   Current functional level related to goals / functional outcomes: See Evaluation note for discharge status   Remaining deficits: Unknown    Education / Equipment: HEP  Plan: Patient agrees to discharge.  Patient goals were not met. Patient is being discharged due to not returning since the last visit.  ?????    Tanvir Hipple P. Helene Kelp PT, MPH 11/24/17 3:28 PM

## 2017-09-22 NOTE — Patient Instructions (Addendum)
WALKING  Walking is a great form of exercise to increase your strength, endurance and overall fitness.  A walking program can help you start slowly and gradually build endurance as you go.  Everyone's ability is different, so each person's starting point will be different.  You do not have to follow them exactly.  The are just samples. You should simply find out what's right for you and stick to that program.   In the beginning, you'll start off walking 2-3 times a day for short distances.  As you get stronger, you'll be walking further at just 1-2 times per day.  A. You Can Walk For A Certain Length Of Time Each Day    Walk 2-3 minutes 3 times per day.  Increase 1 minutes every 2-3 days (3 times per day).  Work up to 25-30 minutes (1-2 times per day).   Example:   Day 1-2 3 minutes 3 times per day   Day 7-8 5-6 minutes 2-3 times per day   Day 21-28 25 minutes 1-2 times per day  B. You Can Walk For a Certain Distance Each Day     Distance can be substituted for time.    Example:   3 trips to mailbox (at road)   3 trips to corner of block   3 trips around the block  C. Go to local high school and use the track.    Walk for distance ____ around track  Or time ____ minutes  D. Walk __yes__ Nickie Retort _no ___ Run __no_    POSTURAL CORRECTION Tips C    VISUALIZING two plumb lines will help you think of your body as a whole unit. The Side Plumb Line is a straight line passing through the ear, shoulder, hip, just behind the knee, and front of the ankle. The Front / Back Plumb Line is a straight line passing through the middle of the body, dividing the body side to side. Stand during a commercial of a 30 min TV show 2 times

## 2017-09-23 NOTE — Telephone Encounter (Signed)
User: Trina Ao A Date/time: 09/23/17 2:14 PM  Comment: Called pt and her mailbox is full and was unable to leave a message.   Context:  Outcome: Left Message  Phone number: [phone] Phone Type: Mobile  Comm. type: Telephone Call type: Outgoing  Contact: Demetrio Lapping D Relation to patient: Self    User: Trina Ao A Date/time: 09/20/17 3:08 PM  Comment: Called pt on both numbers and mobile number does not have a VM set up and home rings busy.  Context:  Outcome: No Answer/Busy  Phone number: [phone] Phone Type: Mobile  Comm. type: Telephone Call type: Outgoing  Contact: Demetrio Lapping D Relation to patient: Self    User: Trina Ao A Date/time: 09/17/17 9:48 AM  Comment: Tried calling patient on both numbers , mobile's VM was full and home# rang busy 2x(attempted).   Context:  Outcome: No Answer/Busy  Phone number: [phone] Phone Type: Mobile  Comm. type: Telephone Call type: Outgoing  Contact: Demetrio Lapping D Relation to patient: Self

## 2017-10-13 ENCOUNTER — Encounter: Payer: Self-pay | Admitting: Cardiology

## 2017-10-13 ENCOUNTER — Ambulatory Visit (INDEPENDENT_AMBULATORY_CARE_PROVIDER_SITE_OTHER): Payer: Medicare Other | Admitting: Cardiology

## 2017-10-13 DIAGNOSIS — R570 Cardiogenic shock: Secondary | ICD-10-CM | POA: Diagnosis not present

## 2017-10-13 DIAGNOSIS — I2510 Atherosclerotic heart disease of native coronary artery without angina pectoris: Secondary | ICD-10-CM | POA: Diagnosis not present

## 2017-10-13 DIAGNOSIS — I25110 Atherosclerotic heart disease of native coronary artery with unstable angina pectoris: Secondary | ICD-10-CM | POA: Insufficient documentation

## 2017-10-13 DIAGNOSIS — I5181 Takotsubo syndrome: Secondary | ICD-10-CM

## 2017-10-13 DIAGNOSIS — I255 Ischemic cardiomyopathy: Secondary | ICD-10-CM | POA: Diagnosis not present

## 2017-10-13 MED ORDER — LOSARTAN POTASSIUM 25 MG PO TABS: 25.0000 mg | ORAL_TABLET | Freq: Every day | ORAL | 1 refills | Status: DC

## 2017-10-13 MED ORDER — NITROGLYCERIN 0.4 MG SL SUBL: 0.4000 mg | SUBLINGUAL_TABLET | SUBLINGUAL | 12 refills | Status: DC | PRN

## 2017-10-13 MED ORDER — CARVEDILOL 6.25 MG PO TABS: 3.1250 mg | ORAL_TABLET | Freq: Two times a day (BID) | ORAL | 6 refills | Status: DC

## 2017-10-13 MED ORDER — ATORVASTATIN CALCIUM 40 MG PO TABS: 40.0000 mg | ORAL_TABLET | Freq: Every day | ORAL | 6 refills | Status: DC

## 2017-10-13 MED ORDER — CARVEDILOL 6.25 MG PO TABS: 6.2500 mg | ORAL_TABLET | Freq: Two times a day (BID) | ORAL | 6 refills | Status: DC

## 2017-10-13 NOTE — Assessment & Plan Note (Signed)
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 Diffuse tapering from the mid vessel distally around the apex. The vessel has the appearance of potentially diffuse intramural thrombus.

## 2017-10-13 NOTE — Progress Notes (Signed)
10/13/2017 Brenda Lloyd   [birth year]  [id number]  Primary Physician Devra DoppHowell, Tamieka, MD Primary Cardiologist: Dr Royann Shiversroitoru  HPI:  66 y/o female without prior medical problems who presented to [facility] 09/04/17 around 9PM With a NSTEMI. She was placed on Heparin, statin, and ASA and [facility]. The next morning she became acutely shocky with respiratory failure and was taken urgently to the cath lab. Cath revealed "Dist LAD lesion, 65 %stenosed. Diffuse tapering from the mid vessel distally around the apex. The vessel has the appearance of potentially diffuse intramural thrombus" and "moderate to severe left ventricular systolic dysfunction. The left ventricular ejection fraction is 25-35% by visual estimate. - In a Takotsubo pattern". Later that afternoon the pt was noted to be seizing and a STAT neurology consult was obtained. She did not a have a CVA and this was felt to be secondary to hypotension and hypoxia. She was on IV inotropics for a time in ICU. F/U echo before discharge showed her EF to be 30-35%. She was weak at the time of discharge and was sent to in patient rehab but she left after a couple of days because she felt better.   She is in the office today for follow up. She says she is doing better, walking twice a day, no chest pain, no unusual SOB. She is very anxious to resume running and weight lifting.    Current Outpatient Prescriptions  Medication Sig Dispense Refill  . acetaminophen (TYLENOL) 500 MG tablet Take 1,000 mg by mouth every 6 (six) hours as needed (pain).    Marland Kitchen. aspirin EC 81 MG tablet Take 81 mg by mouth daily.    . carvedilol (COREG) 3.125 MG tablet Take 1 tablet (3.125 mg total) by mouth 2 (two) times daily with a meal. 60 tablet 11  . losartan (COZAAR) 25 MG tablet Take 0.5 tablets (12.5 mg total) by mouth daily. 30 tablet 1  . nitroGLYCERIN (NITROSTAT) 0.4 MG SL tablet Place 1 tablet (0.4 mg total) under the tongue every 5 (five) minutes as  needed for chest pain. 25 tablet 12  . potassium chloride SA (K-DUR,KLOR-CON) 20 MEQ tablet Take 1 tablet (20 mEq total) by mouth 2 (two) times daily. 60 tablet 0   No current facility-administered medications for this visit.     Allergies  Allergen Reactions  . Amoxicillin Anaphylaxis  . Ciprofloxacin Anaphylaxis  . Fluocinolone Rash    Reported by [facility] 12/09/11 - unknown to spouse  . Penicillins Anaphylaxis    Has patient had a PCN reaction causing immediate rash, facial/tongue/throat swelling, SOB or lightheadedness with hypotension: Yes Has patient had a PCN reaction causing severe rash involving mucus membranes or skin necrosis: No Has patient had a PCN reaction that required hospitalization: No Has patient had a PCN reaction occurring within the last 10 years: No If all of the above answers are "NO", then may proceed with Cephalosporin use.  . Sulfa Antibiotics Anaphylaxis  . Influenza Vaccines Swelling    Caused arm swelling Caused arm swelling    Past Medical History:  Diagnosis Date  . Hypertension     Social History   Social History  . Marital status: Married    Spouse name: N/A  . Number of children: N/A  . Years of education: N/A   Occupational History  . Not on file.   Social History Main Topics  . Smoking status: Never Smoker  . Smokeless tobacco: Never Used  . Alcohol use Yes  Comment: occ  . Drug use: No  . Sexual activity: Not on file   Other Topics Concern  . Not on file   Social History Narrative  . No narrative on file     No family history on file.   Review of Systems: General: negative for chills, fever, night sweats or weight changes.  Cardiovascular: negative for chest pain, dyspnea on exertion, edema, orthopnea, palpitations, paroxysmal nocturnal dyspnea or shortness of breath Dermatological: negative for rash Respiratory: negative for cough or wheezing Urologic: negative for hematuria Abdominal: negative for nausea,  vomiting, diarrhea, bright red blood per rectum, melena, or hematemesis Neurologic: negative for visual changes, syncope, or dizziness All other systems reviewed and are otherwise negative except as noted above.    Blood pressure 128/76, pulse 72, height 5\' 9"  (1.753 m), weight 182 lb (82.6 kg).  General appearance: alert, cooperative, no distress and a little anxious Neck: no carotid bruit and no JVD Lungs: clear to auscultation bilaterally Heart: regular rate and rhythm Extremities: extremities normal, atraumatic, no cyanosis or edema Skin: Skin color, texture, turgor normal. No rashes or lesions Neurologic: Grossly normal  EKG NSR, anterior, lateral, and septal TWI  ASSESSMENT AND PLAN:   Takotsubo syndrome Takotsubo type event 09/04/17  Cardiogenic shock (HCC) With seizure, intubation Sept 2018  CAD (coronary artery disease) Cath 09/05/17- Dist LAD lesion, 65 %stenosed. Diffuse tapering from the mid vessel distally around the apex. The vessel has the appearance of potentially diffuse intramural thrombus.  Cardiomyopathy, ischemic Echo 09/08/17-  Left ventricle: The cavity size was normal. There was moderate   concentric hypertrophy. Systolic function was moderately to   severely reduced. The estimated ejection fraction was in the   range of 30% to 35%. There is akinesis of the anteroseptal,   lateral, inferior, and apical myocardium.  Hyperlipidemia LDL 133- will add Lipitor 40 mg  CHF (congestive heart failure) (HCC) Currently compensated   PLAN  I suggested the pt hold off on "running" and weight lifting for another couple of weeks. I did tell her it was OK to drive as its been 4 weeks and she is doing well. I increased her Coreg to 6.25 mg BID, stopped her Lasix 40 mg, and increased her Cozaar to 25 mg Q HS, I know Dr Royann Shivers wanted to go slow with medication titration secondary to hypotension in the hospital. I did order an echo for 4 weeks and added Lipitor 40 mg. F/U  with Dr Royann Shivers 3 months.   Corine Shelter PA-C 10/13/2017 2:23 PM

## 2017-10-13 NOTE — Assessment & Plan Note (Signed)
Takotsubo type event 09/04/17

## 2017-10-13 NOTE — Patient Instructions (Addendum)
Medication Instructions:  STOP Lasix INCREASE Cozaar to 25 mg Take 1 tablet once a day at NIGHT INCREASE Coreg to 6.25 mg Take 1 tablet twice a day START Lipitor 40 mg take 1 tablet once a day  Labwork: Your physician recommends that you return for lab work in: 1 month on same day as ECHO- MUST FAST FOR LIPIDS and CMET  Testing/Procedures: Your physician has requested that you have an echocardiogram. Echocardiography is a painless test that uses sound waves to create images of your heart. It provides your doctor with information about the size and shape of your heart and how well your heart's chambers and valves are working. This procedure takes approximately one hour. There are no restrictions for this procedure. ECHO TO BE SCHEDULED IN 1 MONTH [address]  Follow-Up: Your physician recommends that you schedule a follow-up appointment in: 3 Months with Dr Royann Shiversroitoru  Any Other Special Instructions Will Be Listed Below (If Applicable). 1. IT IS OK FOR YOU TO DRIVE 2. NO RUNNING 3. NO HEAVY LIFTING   If you need a refill on your cardiac medications before your next appointment, please call your pharmacy.

## 2017-10-13 NOTE — Assessment & Plan Note (Signed)
LDL 133- will add Lipitor 40 mg

## 2017-10-13 NOTE — Assessment & Plan Note (Signed)
With seizure, intubation Sept 2018

## 2017-10-13 NOTE — Progress Notes (Signed)
Great, thanks MCr 

## 2017-10-13 NOTE — Assessment & Plan Note (Signed)
Currently compensated 

## 2017-10-13 NOTE — Assessment & Plan Note (Signed)
Echo 09/08/17-  Left ventricle: The cavity size was normal. There was moderate   concentric hypertrophy. Systolic function was moderately to   severely reduced. The estimated ejection fraction was in the   range of 30% to 35%. There is akinesis of the anteroseptal,   lateral, inferior, and apical myocardium.

## 2017-10-14 ENCOUNTER — Encounter: Payer: Medicare Other | Admitting: Physical Medicine & Rehabilitation

## 2017-10-14 ENCOUNTER — Inpatient Hospital Stay: Payer: Medicare Other | Admitting: Physical Medicine & Rehabilitation

## 2017-10-15 ENCOUNTER — Telehealth: Payer: Self-pay | Admitting: Cardiovascular Disease

## 2017-10-15 NOTE — Telephone Encounter (Signed)
New message    Pt c/o medication issue:  1. Name of Medication: potassium chloride   2. How are you currently taking this medication (dosage and times per day)?  1 tablet a day  3. Are you having a reaction (difficulty breathing--STAT)? No  4. What is your medication issue?  Is she suppose to continue taking this, since they stopped the fluid pill

## 2017-10-15 NOTE — Telephone Encounter (Signed)
Recommendations discussed with patient, who verbalized understanding and thanks.  

## 2017-10-15 NOTE — Telephone Encounter (Signed)
Yes, stop K+  Tobin Cadiente PA-C 10/15/2017 1:49 PM

## 2017-11-11 ENCOUNTER — Telehealth: Payer: Self-pay | Admitting: Cardiovascular Disease

## 2017-11-11 DIAGNOSIS — I2510 Atherosclerotic heart disease of native coronary artery without angina pectoris: Secondary | ICD-10-CM

## 2017-11-11 NOTE — Telephone Encounter (Signed)
Spoke with pt, [facility] [address] [facility]. appt made.

## 2017-11-11 NOTE — Telephone Encounter (Signed)
Please call,pt says she need lab work tomorrow. [facility] [address]where does she need to get her lab work?

## 2017-11-12 ENCOUNTER — Ambulatory Visit (HOSPITAL_COMMUNITY): Payer: Medicare Other | Attending: Cardiology

## 2017-11-12 ENCOUNTER — Other Ambulatory Visit: Payer: Self-pay

## 2017-11-12 ENCOUNTER — Other Ambulatory Visit: Payer: Medicare Other | Admitting: *Deleted

## 2017-11-12 DIAGNOSIS — R570 Cardiogenic shock: Secondary | ICD-10-CM | POA: Insufficient documentation

## 2017-11-12 DIAGNOSIS — I255 Ischemic cardiomyopathy: Secondary | ICD-10-CM | POA: Diagnosis not present

## 2017-11-12 DIAGNOSIS — I2510 Atherosclerotic heart disease of native coronary artery without angina pectoris: Secondary | ICD-10-CM | POA: Diagnosis not present

## 2017-11-12 DIAGNOSIS — I5181 Takotsubo syndrome: Secondary | ICD-10-CM | POA: Diagnosis not present

## 2017-11-12 LAB — COMPREHENSIVE METABOLIC PANEL
ALBUMIN: 4.3 g/dL (ref 3.6–4.8)
ALK PHOS: 97 IU/L (ref 39–117)
ALT: 35 IU/L — ABNORMAL HIGH (ref 0–32)
AST: 20 IU/L (ref 0–40)
Albumin/Globulin Ratio: 1.7 (ref 1.2–2.2)
BUN / CREAT RATIO: 16 (ref 12–28)
BUN: 8 mg/dL (ref 8–27)
Bilirubin Total: 0.6 mg/dL (ref 0.0–1.2)
CALCIUM: 9.5 mg/dL (ref 8.7–10.3)
CO2: 26 mmol/L (ref 20–29)
CREATININE: 0.5 mg/dL — AB (ref 0.57–1.00)
Chloride: 106 mmol/L (ref 96–106)
GFR, EST AFRICAN AMERICAN: 117 mL/min/{1.73_m2} (ref >59)
GFR, EST NON AFRICAN AMERICAN: 101 mL/min/{1.73_m2} (ref >59)
GLOBULIN, TOTAL: 2.6 g/dL (ref 1.5–4.5)
Glucose: 114 mg/dL — ABNORMAL HIGH (ref 65–99)
Potassium: 4.4 mmol/L (ref 3.5–5.2)
SODIUM: 146 mmol/L — AB (ref 134–144)
TOTAL PROTEIN: 6.9 g/dL (ref 6.0–8.5)

## 2017-11-12 LAB — LIPID PANEL
Chol/HDL Ratio: 2.9 ratio (ref 0.0–4.4)
Cholesterol, Total: 128 mg/dL (ref 100–199)
HDL: 44 mg/dL (ref >39)
LDL Calculated: 63 mg/dL (ref 0–99)
TRIGLYCERIDES: 103 mg/dL (ref 0–149)
VLDL Cholesterol Cal: 21 mg/dL (ref 5–40)

## 2017-11-16 ENCOUNTER — Telehealth: Payer: Self-pay | Admitting: Cardiovascular Disease

## 2017-11-16 NOTE — Telephone Encounter (Signed)
F/U Call: ° °Patient returning call °

## 2017-11-16 NOTE — Telephone Encounter (Signed)
New Message    Pt is returning your call about myocardial perfusion

## 2017-11-16 NOTE — Telephone Encounter (Signed)
Attempted to return call to patient. Mailbox is full. Unable to leave message.

## 2017-11-17 NOTE — Telephone Encounter (Signed)
Results reviewed. Patient verbalized understanding.

## 2017-11-17 NOTE — Telephone Encounter (Signed)
Returning your call. °

## 2017-11-17 NOTE — Telephone Encounter (Signed)
Pt returning RN call

## 2017-12-16 ENCOUNTER — Other Ambulatory Visit: Payer: Self-pay | Admitting: Cardiology

## 2017-12-16 DIAGNOSIS — I5181 Takotsubo syndrome: Secondary | ICD-10-CM

## 2017-12-16 DIAGNOSIS — R570 Cardiogenic shock: Secondary | ICD-10-CM

## 2017-12-16 DIAGNOSIS — I2510 Atherosclerotic heart disease of native coronary artery without angina pectoris: Secondary | ICD-10-CM

## 2017-12-16 DIAGNOSIS — I255 Ischemic cardiomyopathy: Secondary | ICD-10-CM

## 2017-12-27 DIAGNOSIS — I10 Essential (primary) hypertension: Secondary | ICD-10-CM | POA: Insufficient documentation

## 2018-01-20 ENCOUNTER — Ambulatory Visit: Payer: Medicare Other | Admitting: Cardiovascular Disease

## 2018-01-20 ENCOUNTER — Encounter: Payer: Self-pay | Admitting: Cardiovascular Disease

## 2018-01-20 VITALS — BP 122/70 | HR 61 | Ht 69.0 in | Wt 180.0 lb

## 2018-01-20 DIAGNOSIS — I5181 Takotsubo syndrome: Secondary | ICD-10-CM

## 2018-01-20 DIAGNOSIS — I2510 Atherosclerotic heart disease of native coronary artery without angina pectoris: Secondary | ICD-10-CM | POA: Diagnosis not present

## 2018-01-20 DIAGNOSIS — E7800 Pure hypercholesterolemia, unspecified: Secondary | ICD-10-CM | POA: Diagnosis not present

## 2018-01-20 NOTE — Progress Notes (Signed)
Cardiology Office Note:    Date:  01/21/2018   ID:  Brenda Lloyd, DOB Aug 16, 1951, MRN [id number]  PCP:  [name], MD  Cardiologist:  [name], MD   Referring MD: [name], MD   Chief Complaint  Patient presents with  . 3 month f/u    pt states she thinks carvedilol may be making her hair fall out; states she has not started exercising; no other Sx.    History of Present Illness:    Brenda Lloyd is a 67 y.o. female with a hx of stress cardiomyopathy complicated by cardiogenic shock and acute heart failure roughly 3 months ago.  Cardiac catheterization performed at that time showed an incidental finding of a 65% stenosis in the distal LAD artery.  Her ejection fraction was as low as 25-35%.  Follow-up echocardiogram performed on November 12, 2017 shows complete recovery, with a left ventricular ejection fraction of 55-60%.  Regional wall motions have resolved completely.  From a physical standpoint she feels great.  She does not have any problems with shortness of breath, palpitations, leg edema, chest pain at rest or with activity, syncope, focal neurological deficits or intermittent claudication.  She remains physically active, but has withdrawn socially to some degree.  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  She does not appear to be melancholy or tearful.  She does state things like "I am sorry I did not die".  "It would have been so peaceful".  She is not suicidal.  She makes it very clear that she has no intention to cause her self-harm but would be very much pleased if she would pass away in a graceful an uncomplicated manner.  She is taking aspirin and statin therapy without side effects.  She does complain about hair loss which she believes is substantial.  It is probably related to her beta-blocker.  She is also on a low-dose of losartan.  Past Medical History:  Diagnosis  Date  . Hypertension     Past Surgical History:  Procedure Laterality Date  . LEFT HEART CATH AND CORONARY ANGIOGRAPHY N/A 09/05/2017   Procedure: LEFT HEART CATH AND CORONARY ANGIOGRAPHY;  Surgeon: [name], MD;  Location: [facility] INVASIVE CV LAB;  Service: Cardiovascular;  Laterality: N/A;    Current Medications: Current Meds  Medication Sig  . acetaminophen (TYLENOL) 500 MG tablet Take 1,000 mg by mouth every 6 (six) hours as needed (pain).  Marland Kitchen aspirin EC 81 MG tablet Take 81 mg by mouth daily.  . carvedilol (COREG) 6.25 MG tablet Take 1 tablet (6.25 mg total) by mouth 2 (two) times daily with a meal.  . losartan (COZAAR) 25 MG tablet TAKE 1 TABLET BY MOUTH EVERY DAY  . nitroGLYCERIN (NITROSTAT) 0.4 MG SL tablet Place 1 tablet (0.4 mg total) under the tongue every 5 (five) minutes as needed for chest pain.     Allergies:   Amoxicillin; Ciprofloxacin; Fluocinolone; Penicillins; Sulfa antibiotics; and Influenza vaccines   Social History   Socioeconomic History  . Marital status: Married    Spouse name: None  . Number of children: None  . Years of education: None  . Highest education level: None  Social Needs  . Financial resource strain: None  . Food insecurity - worry: None  . Food insecurity - inability: None  . Transportation needs - medical: None  . Transportation needs - non-medical: None  Occupational History  .  None  Tobacco Use  . Smoking status: Never Smoker  . Smokeless tobacco: Never Used  Substance and Sexual Activity  . Alcohol use: Yes    Comment: occ  . Drug use: No  . Sexual activity: None  Other Topics Concern  . None  Social History Narrative  . None     Family History: The patient's family history significantly negative for premature coronary artery disease ROS:   Please see the history of present illness.     All other systems reviewed and are negative.  EKGs/Labs/Other Studies Reviewed:    The following studies were reviewed  today: Echocardiogram from November 2018  EKG:  EKG is not ordered today.    Recent Labs: 09/04/2017: B Natriuretic Peptide 147.3; TSH 0.107 09/08/2017: Magnesium 2.0 09/13/2017: Hemoglobin 11.1; Platelets 328 11/12/2017: ALT 35; BUN 8; Creatinine, Ser 0.50; Potassium 4.4; Sodium 146  Recent Lipid Panel    Component Value Date/Time   CHOL 128 11/12/2017 0916   TRIG 103 11/12/2017 0916   HDL 44 11/12/2017 0916   CHOLHDL 2.9 11/12/2017 0916   CHOLHDL 5.5 09/04/2017 2105   VLDL 20 09/04/2017 2105   LDLCALC 63 11/12/2017 0916    Physical Exam:    VS:  BP 122/70 (BP Location: Left Arm, Patient Position: Sitting, Cuff Size: Large)   Pulse 61   Ht 5\' 9"  (1.753 m)   Wt 180 lb (81.6 kg)   BMI 26.58 kg/m     Wt Readings from Last 3 Encounters:  01/20/18 180 lb (81.6 kg)  10/13/17 182 lb (82.6 kg)  09/14/17 182 lb (82.6 kg)     GEN:  Well nourished, well developed in no acute distress HEENT: Normal NECK: No JVD; No carotid bruits LYMPHATICS: No lymphadenopathy CARDIAC: RRR, no murmurs, rubs, gallops RESPIRATORY:  Clear to auscultation without rales, wheezing or rhonchi  ABDOMEN: Soft, non-tender, non-distended MUSCULOSKELETAL:  No edema; No deformity  SKIN: Warm and dry NEUROLOGIC:  Alert and oriented x 3 PSYCHIATRIC:  Normal affect   ASSESSMENT:    1. Coronary artery disease involving native coronary artery of native heart without angina pectoris   2. Takotsubo cardiomyopathy   3. Hypercholesterolemia    PLAN:    In order of problems listed above:  1. CAD: She had incidentally discovered relatively minor coronary artery disease that did not play a direct pathophysiological role in her acute illness last year.  She does not have angina pectoris even with activity.  I do not think there is a compelling reason for her to take a beta-blocker and will discontinue it since it is likely the cause for her hair loss.  She is willing to continue taking aspirin and statin to prevent  disease progression and future acute coronary events.  We will continue losartan for the time being and wait to see how her blood pressure response to the discontinuation of carvedilol.  Instructed her to wean the carvedilol over a period of a week, by first dropping the dose in half before stopping it altogether. 2. Takotsubo sd.:  She was under considerable emotional stress when these events occurred last year.  She has made a full and complete physical recovery.  Emotional turmoil still appears to be present. 3. HLP: All lipid parameters are well within desirable range on current medications.   Medication Adjustments/Labs and Tests Ordered: Current medicines are reviewed at length with the patient today.  Concerns regarding medicines are outlined above.  No orders of the defined types were placed in  this encounter.  No orders of the defined types were placed in this encounter.   Signed, [name], MD  01/21/2018 6:14 PM    [facility]

## 2018-01-20 NOTE — Patient Instructions (Signed)
Dr Croitoru recommends that you schedule a follow-up appointment in 12 months. You will receive a reminder letter in the mail two months in advance. If you don't receive a letter, please call our office to schedule the follow-up appointment.  If you need a refill on your cardiac medications before your next appointment, please call your pharmacy. 

## 2018-03-14 IMAGING — CT CT ANGIO NECK
1 of 11 series · 5 of 35 positions shown · IV contrast (APPLIED)
Comparison: None.

ADDENDUM:
Bilateral pleural effusions.
CLINICAL DATA: Seizure today. Cardiac catheterization earlier this
morning.

EXAM:
CT ANGIOGRAPHY HEAD AND NECK
TECHNIQUE: Multidetector CT imaging of the head and neck was performed using
the standard protocol during bolus administration of intravenous
contrast. Multiplanar CT image reconstructions and MIPs were
obtained to evaluate the vascular anatomy. Carotid stenosis
measurements (when applicable) are obtained utilizing NASCET
criteria, using the distal internal carotid diameter as the
denominator.
CONTRAST:  90 mL Isovue 370

[Series 11: ax thins · axial · 0.33mm/px · z∈[-235,+5]mm · 5 of 360 slices shown]
[im 60/360  soft-tissue]
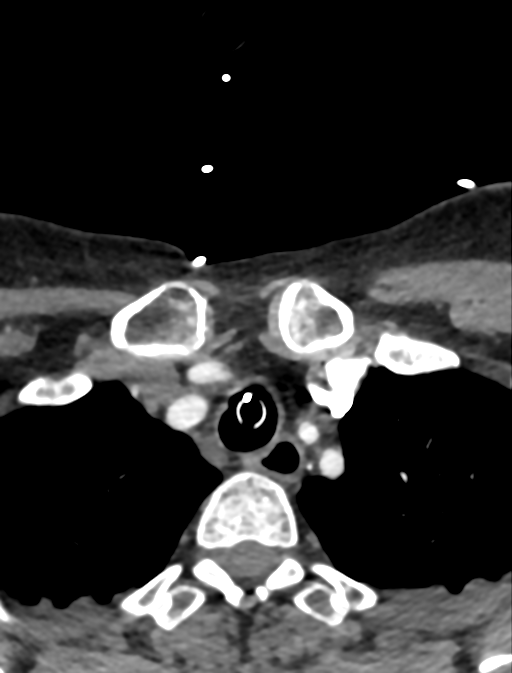
[im 120/360  bone]
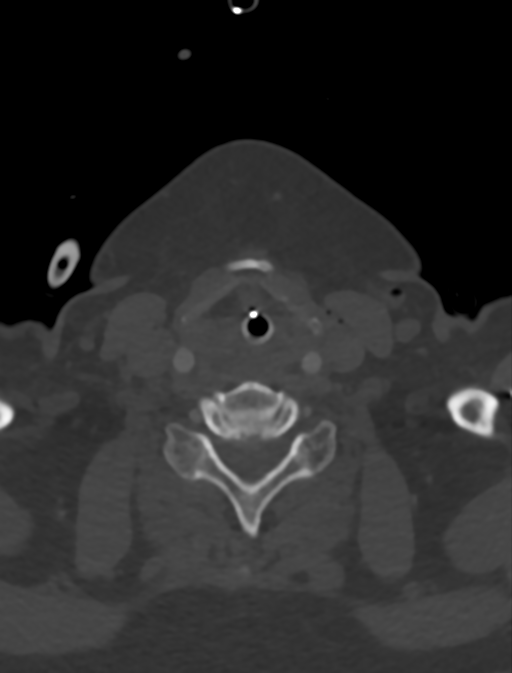
[im 180/360  soft-tissue]
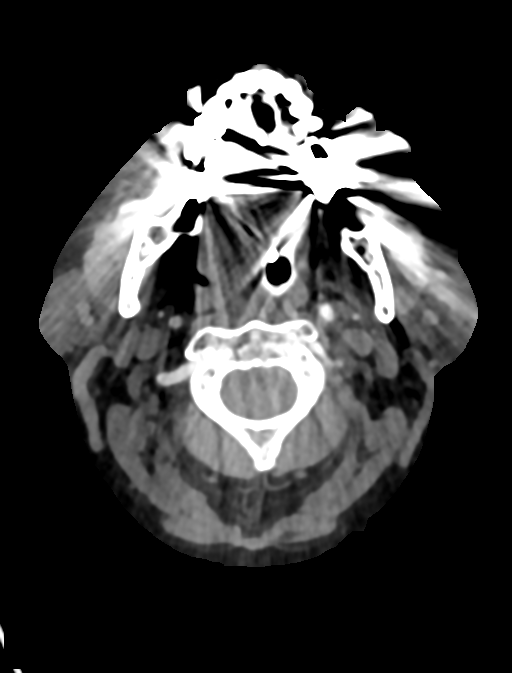
[im 240/360  bone]
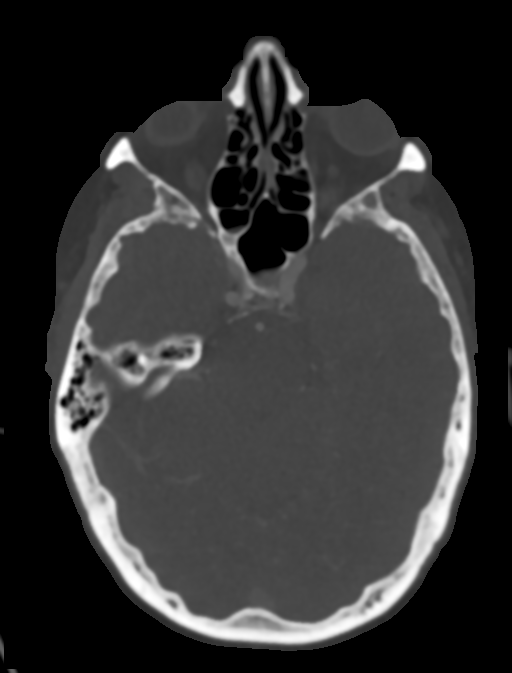
[im 300/360  soft-tissue]
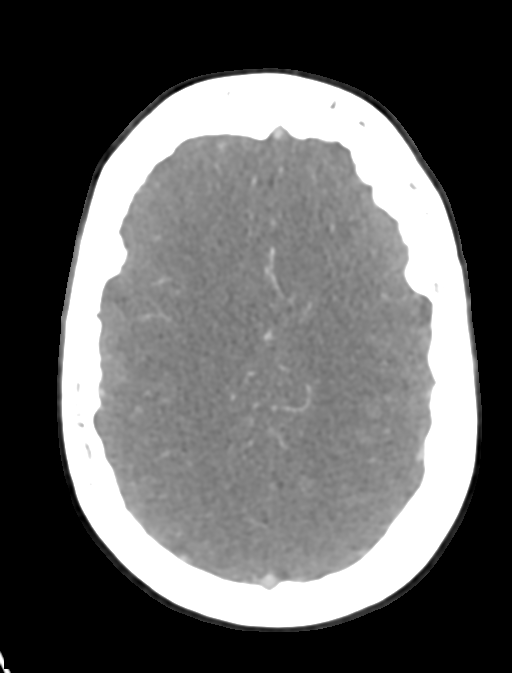

[5 of 35 positions shown; findings below may reference images not displayed]

FINDINGS: CT HEAD FINDINGS

Brain: There is no evidence of acute infarct, intracranial
hemorrhage, mass, midline shift, or extra-axial fluid collection.
The ventricles and sulci are normal.

Vascular: Minimal calcified atherosclerosis at the skullbase.

Skull: No fracture or destructive osseous lesion.

Sinuses: Mild mucosal thickening in the maxillary sinuses. Clear
mastoid air cells.

Orbits: Unremarkable.

Review of the MIP images confirms the above findings

CTA NECK FINDINGS

Aortic arch: Standard 3 vessel aortic arch. Widely patent
brachiocephalic and subclavian arteries.

Right carotid system: Patent without evidence of stenosis,
dissection, or significant atherosclerosis.

Left carotid system: Patent without evidence of stenosis or
dissection. Mild calcified plaque at the carotid bulb.

Vertebral arteries: Patent without evidence of stenosis or
dissection. The right vertebral artery is minimally larger than the
left. Small focal ossification is incidentally noted in the soft
tissues adjacent to the left V3 segment at the C1 level, not
affecting the vessel.

Skeleton: Incidental incomplete fusion of the C1 posterior ring, a
normal variant. Moderate disc degeneration at C5-6 and C6-7.

Other neck: Asymmetric enlargement and heterogeneity of the right
thyroid lobe with coarse calcification. Retained secretions in the
pharynx with an endotracheal tube in place, terminating well above
the carina.

Upper chest: Partially visualized small bilateral pleural effusions
and dependent atelectasis.

Review of the MIP images confirms the above findings

CTA HEAD FINDINGS

Anterior circulation: The internal carotid arteries are widely
patent from skullbase to carotid termini. ACAs and MCAs are patent
with mild branch vessel irregularity but no evidence of proximal
branch occlusion or significant stenosis. No aneurysm.

Posterior circulation: The intracranial vertebral arteries are
widely patent to the basilar. Patent right PICA, bilateral AICA,
bilateral SCA origins are identified. The basilar artery is widely
patent. There is a small right posterior communicating artery. PCAs
are patent without evidence of significant stenosis. No aneurysm.

Venous sinuses: As permitted by contrast timing, patent.

Anatomic variants: None.

Delayed phase: Not performed.

Review of the MIP images confirms the above findings
IMPRESSION: 1. No emergent large vessel occlusion.
2. Mild intracranial and extracranial atherosclerosis without
significant stenosis.
3. No evidence of acute intracranial abnormality on CT.

These results were called by telephone at the time of interpretation
on 09/05/2017 at [DATE] to Dr. ALISHIA LOZA , who verbally
acknowledged these results.

## 2018-03-15 ENCOUNTER — Telehealth: Payer: Self-pay | Admitting: Cardiovascular Disease

## 2018-03-15 MED ORDER — LISINOPRIL 10 MG PO TABS: 10.0000 mg | ORAL_TABLET | Freq: Every day | ORAL | 3 refills | Status: DC

## 2018-03-15 NOTE — Telephone Encounter (Signed)
Returned call to patient with MD recommendations. She agrees w/plan for med change. Rx(s) sent to pharmacy electronically. Advised that she monitor her BP at home. She would like to keep her 3/26 appt for now, until she can determine if this medication is effective for her.

## 2018-03-15 NOTE — Telephone Encounter (Signed)
Please tell her that I do think she requires medications for her blood pressure, but I think we will stop the losartan and switch to something else.  She is likely to have difficulty finding this medication due to the recalls and it probably not providing her good 24-hour blood pressure coverage.  Start lisinopril 10 mg once daily instead, please. MCr

## 2018-03-15 NOTE — Telephone Encounter (Signed)
Returned call to patient of Dr. Salena Saner who was seen in Jan 2019 - scheduled for BP check on 03/22/18 with Dr. Salena Saner at the time of the call.  She reports yesterday was the first time she checked her BP in a while, she checked BP b/c she was feeling quite like herself/hasn't felt as great as she usually does. She has had a headache for 3-4 days. She denies dizziness. She doesn't "feel badly". She reports she is under a lot stress, but does not feel this has an affect on her BP. She was advised to wean off carvedilol at her last visit so she is only taking losartan 25mg .   She reports her BP was in the 160s yesterday. She reports her BP was 160/83 this AM before her med (losartan 25mg )   Advised that patient check her BP before losartan dose and then again about 1-2 hours after the medication to see how much of an effect the medication has on her BP readings and also can check in afternoon or evening. Advised she call in on Friday AM with BP readings so that BP trends can be forwarded to MD to review and advise if her losartan dose should be adjusted. If BP meds need to be adjusted and can be done so over the phone, patient states she does not feel she would need to see MD for BP med adjustment next week.   Forwarded to MD/CMA for advice/FYI

## 2018-03-15 NOTE — Telephone Encounter (Signed)
New Message   Pt c/o BP issue:  1. What are your last 5 BP readings? Patient has them but did not have them available at the time of call 2. Are you having any other symptoms (ex. Dizziness, headache, blurred vision, passed out)? Had a headache 3. What is your medication issue? None   Patient states that her BP is going up and down. She thinks that she needs her medication adjusted.

## 2018-03-22 ENCOUNTER — Encounter: Payer: Self-pay | Admitting: Cardiovascular Disease

## 2018-03-22 ENCOUNTER — Ambulatory Visit: Payer: Medicare Other | Admitting: Cardiovascular Disease

## 2018-03-22 VITALS — BP 118/72 | HR 74 | Ht 69.0 in | Wt 186.6 lb

## 2018-03-22 DIAGNOSIS — I2510 Atherosclerotic heart disease of native coronary artery without angina pectoris: Secondary | ICD-10-CM | POA: Diagnosis not present

## 2018-03-22 DIAGNOSIS — I5181 Takotsubo syndrome: Secondary | ICD-10-CM

## 2018-03-22 DIAGNOSIS — I10 Essential (primary) hypertension: Secondary | ICD-10-CM | POA: Diagnosis not present

## 2018-03-22 DIAGNOSIS — E7800 Pure hypercholesterolemia, unspecified: Secondary | ICD-10-CM

## 2018-03-22 NOTE — Patient Instructions (Signed)
Dr Croitoru recommends that you schedule a follow-up appointment in 12 months. You will receive a reminder letter in the mail two months in advance. If you don't receive a letter, please call our office to schedule the follow-up appointment.  If you need a refill on your cardiac medications before your next appointment, please call your pharmacy. 

## 2018-03-22 NOTE — Progress Notes (Addendum)
Cardiology Office Note:    Date:  03/22/2018   ID:  Brenda Lloyd, DOB 08/12/1951, MRN [id number]  PCP:  Devra DoppHowell, Tamieka, MD  Cardiologist:  Thurmon FairMihai Hung Rhinesmith, MD   Referring MD: Devra DoppHowell, Tamieka, MD   chief complaint: recurrence of Takotsubo sd?  History of Present Illness:    Brenda Lloyd is a 67 y.o. 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  Cardiac catheterization performed at that time showed an incidental finding of a 65% stenosis in the distal LAD artery.  Her ejection fraction was as low as 25-35%.  Follow-up echocardiogram performed on November 12, 2017 shows complete recovery, with a left ventricular ejection fraction of 55-60%.  Regional wall motions have resolved completely.  For the most part she has done well in the last 3 months has had some issues with shortness of breath and chest pressure for a few days last week.  She noticed that her blood pressure was high at that time.  She switched to lisinopril: today her blood pressure is normal and she feels better.  She is here primarily since she is concerned that she has recurrence of the stress cardiomyopathy she is also upset that she has gained weight.  She try to go to cardiac rehab to do this she has been a lot of emotional distress after a "family tragedy".  She denies exertional angina or dyspnea, leg edema, claudication, dictations, syncope or focal neurological complaints.  She admits to being distressed emotionally, but she denies suicidal ideation.   Past Medical History:  Diagnosis Date  . Hypertension     Past Surgical History:  Procedure Laterality Date  . LEFT HEART CATH AND CORONARY ANGIOGRAPHY N/A 09/05/2017   Procedure: LEFT HEART CATH AND CORONARY ANGIOGRAPHY;  Surgeon: Marykay LexHarding, David W, MD;  Location: [facility] INVASIVE CV LAB;  Service: Cardiovascular;  Laterality: N/A;    Current Medications: Current Meds  Medication Sig    . acetaminophen (TYLENOL) 500 MG tablet Take 1,000 mg by mouth every 6 (six) hours as needed (pain).  Marland Kitchen. aspirin EC 81 MG tablet Take 81 mg by mouth daily.  Marland Kitchen. atorvastatin (LIPITOR) 40 MG tablet Take 1 tablet (40 mg total) by mouth daily.  Marland Kitchen. lisinopril (PRINIVIL,ZESTRIL) 10 MG tablet Take 1 tablet (10 mg total) by mouth daily.  . nitroGLYCERIN (NITROSTAT) 0.4 MG SL tablet Place 1 tablet (0.4 mg total) under the tongue every 5 (five) minutes as needed for chest pain.     Allergies:   Amoxicillin; Ciprofloxacin; Fluocinolone; Penicillins; Sulfa antibiotics; and Influenza vaccines   Social History   Socioeconomic History  . Marital status: Married    Spouse name: Not on file  . Number of children: Not on file  . Years of education: Not on file  . Highest education level: Not on file  Occupational History  . Not on file  Social Needs  . Financial resource strain: Not on file  . Food insecurity:    Worry: Not on file    Inability: Not on file  . Transportation needs:    Medical: Not on file    Non-medical: Not on file  Tobacco Use  . Smoking status: Never Smoker  . Smokeless tobacco: Never Used  Substance and Sexual Activity  . Alcohol use: Yes    Comment: occ  . Drug use: No  . Sexual activity: Not on file  Lifestyle  . Physical activity:    Days  per week: Not on file    Minutes per session: Not on file  . Stress: Not on file  Relationships  . Social connections:    Talks on phone: Not on file    Gets together: Not on file    Attends religious service: Not on file    Active member of club or organization: Not on file    Attends meetings of clubs or organizations: Not on file    Relationship status: Not on file  Other Topics Concern  . Not on file  Social History Narrative  . Not on file     Family History: The patient's family history is significantly negative for premature coronary artery disease ROS:   Please see the history of present illness.     All other  systems reviewed and are negative.  EKGs/Labs/Other Studies Reviewed:    The following studies were reviewed today: Echocardiogram from November 2018  EKG:  EKG is ordered today.  A completely normal tracing, showing sinus rhythm.  Recent Labs: 09/04/2017: B Natriuretic Peptide 147.3; TSH 0.107 09/08/2017: Magnesium 2.0 09/13/2017: Hemoglobin 11.1; Platelets 328 11/12/2017: ALT 35; BUN 8; Creatinine, Ser 0.50; Potassium 4.4; Sodium 146  Recent Lipid Panel    Component Value Date/Time   CHOL 128 11/12/2017 0916   TRIG 103 11/12/2017 0916   HDL 44 11/12/2017 0916   CHOLHDL 2.9 11/12/2017 0916   CHOLHDL 5.5 09/04/2017 2105   VLDL 20 09/04/2017 2105   LDLCALC 63 11/12/2017 0916    Physical Exam:    VS:  BP 118/72   Pulse 74   Ht 5\' 9"  (1.753 m)   Wt 186 lb 9.6 oz (84.6 kg)   BMI 27.56 kg/m     Wt Readings from Last 3 Encounters:  03/22/18 186 lb 9.6 oz (84.6 kg)  01/20/18 180 lb (81.6 kg)  10/13/17 182 lb (82.6 kg)     General: Alert, oriented x3, no distress, mildly overweight Head: no evidence of trauma, PERRL, EOMI, no exophtalmos or lid lag, no myxedema, no xanthelasma; normal ears, nose and oropharynx Neck: normal jugular venous pulsations and no hepatojugular reflux; brisk carotid pulses without delay and no carotid bruits Chest: clear to auscultation, no signs of consolidation by percussion or palpation, normal fremitus, symmetrical and full respiratory excursions Cardiovascular: normal position and quality of the apical impulse, regular rhythm, normal first and second heart sounds, no murmurs, rubs or gallops Abdomen: no tenderness or distention, no masses by palpation, no abnormal pulsatility or arterial bruits, normal bowel sounds, no hepatosplenomegaly Extremities: no clubbing, cyanosis or edema; 2+ radial, ulnar and brachial pulses bilaterally; 2+ right femoral, posterior tibial and dorsalis pedis pulses; 2+ left femoral, posterior tibial and dorsalis pedis pulses;  no subclavian or femoral bruits Neurological: grossly nonfocal Psych: Normal mood and affect   ASSESSMENT:    1. Coronary artery disease involving native coronary artery of native heart without angina pectoris   2. Essential hypertension   3. Takotsubo cardiomyopathy   4. Hypercholesterolemia    PLAN:    In order of problems listed above:  1. CAD: She had incidentally discovered relatively minor coronary artery disease that did not play a direct pathophysiological role in her acute illness last year.  She does not have angina pectoris with activity.   2. HTN: Seems to be doing better with lisinopril.  We will continue this medication. 3. Takotsubo sd.:  As far as I can tell by physical exam and ECG, there is no recurrence of this  disorder.  I offered an echocardiogram, but she declined.  She was under considerable emotional stress when these events occurred last year.  She has made a full and complete physical recovery.  Emotional turmoil still appears to be present. 4. HLP: All lipid parameters are well within desirable range on current medications.   Medication Adjustments/Labs and Tests Ordered: Current medicines are reviewed at length with the patient today.  Concerns regarding medicines are outlined above.  No orders of the defined types were placed in this encounter.  No orders of the defined types were placed in this encounter.   Signed, [name], MD  03/22/2018 9:37 AM    [facility]

## 2018-05-18 ENCOUNTER — Other Ambulatory Visit: Payer: Self-pay | Admitting: Cardiology

## 2018-05-18 DIAGNOSIS — I5181 Takotsubo syndrome: Secondary | ICD-10-CM

## 2018-05-18 DIAGNOSIS — R570 Cardiogenic shock: Secondary | ICD-10-CM

## 2018-05-18 DIAGNOSIS — I2510 Atherosclerotic heart disease of native coronary artery without angina pectoris: Secondary | ICD-10-CM

## 2018-05-18 DIAGNOSIS — I255 Ischemic cardiomyopathy: Secondary | ICD-10-CM

## 2018-05-18 NOTE — Telephone Encounter (Signed)
Rx sent to pharmacy   

## 2018-09-16 ENCOUNTER — Other Ambulatory Visit: Payer: Self-pay | Admitting: Cardiovascular Disease

## 2018-09-16 DIAGNOSIS — I255 Ischemic cardiomyopathy: Secondary | ICD-10-CM

## 2018-09-16 DIAGNOSIS — I5181 Takotsubo syndrome: Secondary | ICD-10-CM

## 2018-09-16 DIAGNOSIS — I2510 Atherosclerotic heart disease of native coronary artery without angina pectoris: Secondary | ICD-10-CM

## 2018-09-16 DIAGNOSIS — R570 Cardiogenic shock: Secondary | ICD-10-CM

## 2018-09-16 NOTE — Telephone Encounter (Signed)
Rx(s) sent to pharmacy electronically.  

## 2019-02-17 ENCOUNTER — Encounter: Payer: Self-pay | Admitting: Cardiovascular Disease

## 2019-02-17 ENCOUNTER — Ambulatory Visit: Payer: Medicare Other | Admitting: Cardiovascular Disease

## 2019-02-17 VITALS — BP 146/82 | HR 70 | Ht 69.0 in | Wt 176.4 lb

## 2019-02-17 DIAGNOSIS — I5181 Takotsubo syndrome: Secondary | ICD-10-CM | POA: Diagnosis not present

## 2019-02-17 DIAGNOSIS — E7800 Pure hypercholesterolemia, unspecified: Secondary | ICD-10-CM

## 2019-02-17 DIAGNOSIS — I10 Essential (primary) hypertension: Secondary | ICD-10-CM | POA: Diagnosis not present

## 2019-02-17 DIAGNOSIS — I2510 Atherosclerotic heart disease of native coronary artery without angina pectoris: Secondary | ICD-10-CM | POA: Diagnosis not present

## 2019-02-17 MED ORDER — ATORVASTATIN CALCIUM 40 MG PO TABS
40.0000 mg | ORAL_TABLET | Freq: Every day | ORAL | 3 refills | Status: DC
Start: 2019-02-17 — End: 2020-01-02

## 2019-02-17 MED ORDER — VALSARTAN 160 MG PO TABS: 160.0000 mg | ORAL_TABLET | Freq: Every day | ORAL | 3 refills | Status: DC

## 2019-02-17 MED ORDER — ATORVASTATIN CALCIUM 40 MG PO TABS: 40.0000 mg | ORAL_TABLET | Freq: Every day | ORAL | 3 refills | Status: DC

## 2019-02-17 NOTE — Patient Instructions (Signed)
Medication Instructions:  STOP LISINOPRIL  START VALSARTAN 160 MG ONCE DAILY If you need a refill on your cardiac medications before your next appointment, please call your pharmacy.   Lab work: Your physician recommends that you return for lab work in: ONE MONTH PRIOR TO EATING If you have labs (blood work) drawn today and your tests are completely normal, you will receive your results only by: Marland Kitchen MyChart Message (if you have MyChart) OR . A paper copy in the mail If you have any lab test that is abnormal or we need to change your treatment, we will call you to review the results.  Follow-Up: At [facility], you and your health needs are our priority.  As part of our continuing mission to provide you with exceptional heart care, we have created designated Provider Care Teams.  These Care Teams include your primary Cardiologist (physician) and Advanced Practice Providers (APPs -  Physician Assistants and Nurse Practitioners) who all work together to provide you with the care you need, when you need it. You will need a follow up appointment in 12 months.  Please call our office 2 months in advance to schedule this appointment.  You may see [name], MD or one of the following Advanced Practice Providers on your designated Care Team: [place], [place] . Micah Flesher, PA-C

## 2019-02-17 NOTE — Progress Notes (Signed)
Cardiology Office Note:    Date:  02/18/2019   ID:  Brenda Lloyd, DOB 19-Nov-1951, MRN [id number]  PCP:  [name], MD  Cardiologist:  [name], MD   Referring MD: [name], MD   Chief Complaint  Patient presents with  . Follow-up    hyperlipidemia, HTN  . Coronary Artery Disease    History of Present Illness:    Brenda Lloyd is a 68 y.o. 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  Cardiac catheterization performed at that time showed an incidental finding of a 65% stenosis in the distal LAD artery.  Her ejection fraction was as low as 25-35%.  Follow-up echocardiogram performed on November 12, 2017 shows complete recovery, with a left ventricular ejection fraction of 55-60%.   Generally feels very well.  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 The patient specifically denies any chest pain at rest exertion, dyspnea at rest or with exertion, orthopnea, paroxysmal nocturnal dyspnea, syncope, palpitations, focal neurological deficits, intermittent claudication, lower extremity edema, unexplained weight gain, hemoptysis or wheezing.  She has a dry cough and wonders whether it could be secondary to lisinopril.  She does not check her blood pressure at home.  She describes herself as being "too fat" and would like to get her weight down to 140 pounds.  She never checks her blood pressure at home.   Past Medical History:  Diagnosis Date  . Hypertension     Past Surgical History:  Procedure Laterality Date  . LEFT HEART CATH AND CORONARY ANGIOGRAPHY N/A 09/05/2017   Procedure: LEFT HEART CATH AND CORONARY ANGIOGRAPHY;  Surgeon: [name], MD;  Location: [facility] INVASIVE CV LAB;  Service: Cardiovascular;  Laterality: N/A;    Current Medications: Current Meds  Medication Sig  . acetaminophen (TYLENOL) 500 MG tablet Take 1,000 mg by  mouth every 6 (six) hours as needed (pain).  Marland Kitchen aspirin EC 81 MG tablet Take 81 mg by mouth daily.  Marland Kitchen atorvastatin (LIPITOR) 40 MG tablet Take 1 tablet (40 mg total) by mouth daily.  . nitroGLYCERIN (NITROSTAT) 0.4 MG SL tablet Place 1 tablet (0.4 mg total) under the tongue every 5 (five) minutes as needed for chest pain.  . [DISCONTINUED] atorvastatin (LIPITOR) 40 MG tablet Take 1 tablet (40 mg total) by mouth daily.  . [DISCONTINUED] atorvastatin (LIPITOR) 40 MG tablet Take 1 tablet (40 mg total) by mouth daily.  . [DISCONTINUED] lisinopril (PRINIVIL,ZESTRIL) 10 MG tablet Take 1 tablet (10 mg total) by mouth daily.     Allergies:   Amoxicillin; Ciprofloxacin; Fluocinolone; Penicillins; Sulfa antibiotics; and Influenza vaccines   Social History   Socioeconomic History  . Marital status: Married    Spouse name: Not on file  . Number of children: Not on file  . Years of education: Not on file  . Highest education level: Not on file  Occupational History  . Not on file  Social Needs  . Financial resource strain: Not on file  . Food insecurity:    Worry: Not on file    Inability: Not on file  . Transportation needs:    Medical: Not on file    Non-medical: Not on file  Tobacco Use  . Smoking status: Never Smoker  . Smokeless tobacco: Never Used  Substance and Sexual Activity  . Alcohol use: Yes    Comment: occ  . Drug use:  No  . Sexual activity: Not on file  Lifestyle  . Physical activity:    Days per week: Not on file    Minutes per session: Not on file  . Stress: Not on file  Relationships  . Social connections:    Talks on phone: Not on file    Gets together: Not on file    Attends religious service: Not on file    Active member of club or organization: Not on file    Attends meetings of clubs or organizations: Not on file    Relationship status: Not on file  Other Topics Concern  . Not on file  Social History Narrative  . Not on file     Family History: The  patient's family history is significantly negative for premature coronary artery disease ROS:   Please see the history of present illness.     All other systems reviewed and are negative.  EKGs/Labs/Other Studies Reviewed:    The following studies were reviewed today: Echocardiogram from November 2018  EKG:  EKG is ordered today.  It shows normal sinus rhythm and is completely normal.  QTc 423 ms  Recent Labs: No results found for requested labs within last 8760 hours.  Recent Lipid Panel    Component Value Date/Time   CHOL 128 11/12/2017 0916   TRIG 103 11/12/2017 0916   HDL 44 11/12/2017 0916   CHOLHDL 2.9 11/12/2017 0916   CHOLHDL 5.5 09/04/2017 2105   VLDL 20 09/04/2017 2105   LDLCALC 63 11/12/2017 0916    Physical Exam:    VS:  BP (!) 146/82   Pulse 70   Ht 5\' 9"  (1.753 m)   Wt 176 lb 6.4 oz (80 kg)   BMI 26.05 kg/m    Recheck BP 135/80 mmHg Wt Readings from Last 3 Encounters:  02/17/19 176 lb 6.4 oz (80 kg)  03/22/18 186 lb 9.6 oz (84.6 kg)  01/20/18 180 lb (81.6 kg)     General: Alert, oriented x3, no distress, minimally overweight Head: no evidence of trauma, PERRL, EOMI, no exophtalmos or lid lag, no myxedema, no xanthelasma; normal ears, nose and oropharynx Neck: normal jugular venous pulsations and no hepatojugular reflux; brisk carotid pulses without delay and no carotid bruits Chest: clear to auscultation, no signs of consolidation by percussion or palpation, normal fremitus, symmetrical and full respiratory excursions Cardiovascular: normal position and quality of the apical impulse, regular rhythm, normal first and second heart sounds, no murmurs, rubs or gallops Abdomen: no tenderness or distention, no masses by palpation, no abnormal pulsatility or arterial bruits, normal bowel sounds, no hepatosplenomegaly Extremities: no clubbing, cyanosis or edema; 2+ radial, ulnar and brachial pulses bilaterally; 2+ right femoral, posterior tibial and dorsalis pedis  pulses; 2+ left femoral, posterior tibial and dorsalis pedis pulses; no subclavian or femoral bruits Neurological: grossly nonfocal Psych: Normal mood and affect    ASSESSMENT:    1. Coronary artery disease involving native coronary artery of native heart without angina pectoris   2. Essential hypertension   3. Hypercholesterolemia   4. History of takotsubo syndrome    PLAN:    In order of problems listed above:  1. CAD: She had incidentally discovered relatively minor coronary artery disease.  She does not have angina at rest or with activity.  The focus is on risk factor modification. 2. HTN: Switch to angiotensin receptor blocker due to cough with ACE inhibitor.  Her blood pressure seems to be a little high.  Asked her  to send me some blood pressure recordings after couple of weeks on the new medication. 3. HLP: All lipid parameters are well within desirable range on current medications, but it's time for a recheck. 4. Takotsubo sd.:  Fully recovered.   Medication Adjustments/Labs and Tests Ordered: Current medicines are reviewed at length with the patient today.  Concerns regarding medicines are outlined above.  Orders Placed This Encounter  Procedures  . Basic metabolic panel  . Lipid panel  . EKG 12-Lead   Meds ordered this encounter  Medications  . DISCONTD: atorvastatin (LIPITOR) 40 MG tablet    Sig: Take 1 tablet (40 mg total) by mouth daily.    Dispense:  90 tablet    Refill:  3  . DISCONTD: valsartan (DIOVAN) 160 MG tablet    Sig: Take 1 tablet (160 mg total) by mouth daily.    Dispense:  90 tablet    Refill:  3  . atorvastatin (LIPITOR) 40 MG tablet    Sig: Take 1 tablet (40 mg total) by mouth daily.    Dispense:  90 tablet    Refill:  3  . valsartan (DIOVAN) 160 MG tablet    Sig: Take 1 tablet (160 mg total) by mouth daily.    Dispense:  90 tablet    Refill:  3    Signed, Thurmon FairMihai Camren Lipsett, MD  02/18/2019 8:57 AM    [facility]

## 2019-02-18 ENCOUNTER — Encounter: Payer: Self-pay | Admitting: Cardiovascular Disease

## 2019-03-16 LAB — BASIC METABOLIC PANEL
BUN/Creatinine Ratio: 17 (ref 12–28)
BUN: 10 mg/dL (ref 8–27)
CALCIUM: 9.7 mg/dL (ref 8.7–10.3)
CO2: 21 mmol/L (ref 20–29)
CREATININE: 0.59 mg/dL (ref 0.57–1.00)
Chloride: 102 mmol/L (ref 96–106)
GFR, EST AFRICAN AMERICAN: 110 mL/min/{1.73_m2} (ref >59)
GFR, EST NON AFRICAN AMERICAN: 95 mL/min/{1.73_m2} (ref >59)
GLUCOSE: 103 mg/dL — AB (ref 65–99)
Potassium: 4.2 mmol/L (ref 3.5–5.2)
Sodium: 140 mmol/L (ref 134–144)

## 2019-03-16 LAB — LIPID PANEL
CHOL/HDL RATIO: 3.3 ratio (ref 0.0–4.4)
Cholesterol, Total: 144 mg/dL (ref 100–199)
HDL: 44 mg/dL (ref >39)
LDL Calculated: 74 mg/dL (ref 0–99)
TRIGLYCERIDES: 129 mg/dL (ref 0–149)
VLDL CHOLESTEROL CAL: 26 mg/dL (ref 5–40)

## 2019-03-18 ENCOUNTER — Other Ambulatory Visit: Payer: Self-pay | Admitting: Cardiology

## 2019-03-18 DIAGNOSIS — I2510 Atherosclerotic heart disease of native coronary artery without angina pectoris: Secondary | ICD-10-CM

## 2019-03-18 DIAGNOSIS — I5181 Takotsubo syndrome: Secondary | ICD-10-CM

## 2019-03-18 DIAGNOSIS — I255 Ischemic cardiomyopathy: Secondary | ICD-10-CM

## 2019-03-18 DIAGNOSIS — R570 Cardiogenic shock: Secondary | ICD-10-CM

## 2019-03-20 ENCOUNTER — Telehealth: Payer: Self-pay | Admitting: Cardiovascular Disease

## 2019-03-20 NOTE — Telephone Encounter (Signed)
Notes recorded by [name], MD on 03/16/2019 at 6:27 PM EDT All labs excellent, except known borderline glucose level (even that is a little better than average over the last year )   Pt made aware of her lab results per Dr Royann Shivers, as mentioned above. Pt verbalized understanding.  Pt more than gracious for all the assistance provided.

## 2019-03-20 NOTE — Telephone Encounter (Signed)
Patient returned call for lab results.  

## 2019-03-28 ENCOUNTER — Telehealth: Payer: Self-pay | Admitting: Cardiovascular Disease

## 2019-03-28 NOTE — Telephone Encounter (Signed)
Follow Up:; ° ° °Returning your call. °

## 2019-03-28 NOTE — Telephone Encounter (Signed)
Patient would like to go over her medications with a nurse.  She states pharmacy gave her a medication she thinks she doesn't take.

## 2019-03-28 NOTE — Telephone Encounter (Signed)
Called patient back, she states that she received a new medication from her pharmacy and does not know if she should take it or not.   Patient was given Carvedilol, after review of the chart the last OV in Feb 2020, this was not on her active med list. It was recently sent in on 02/2019. Please verify if patient should start this medication, or discontinue, she believes at one time this medication made her cough.   Advised with patient I would send a message to Dr.C to verify, and would call back.  Patient verbalized understanding. Will not take this medication until told to do so by Dr.C.

## 2019-03-28 NOTE — Telephone Encounter (Signed)
She does not need the carvedilol. It has been discontinued.

## 2019-03-28 NOTE — Telephone Encounter (Signed)
Attempted to contact patient regarding her medication. Patient unable to leave a message as mailbox was full.  Will attempt later.

## 2019-03-28 NOTE — Telephone Encounter (Signed)
Notified patient, medication taken off list.

## 2019-03-28 NOTE — Telephone Encounter (Signed)
Noted will call patient and notify.  Thanks!

## 2020-01-02 ENCOUNTER — Other Ambulatory Visit: Payer: Self-pay | Admitting: Cardiovascular Disease

## 2020-01-02 DIAGNOSIS — I5181 Takotsubo syndrome: Secondary | ICD-10-CM

## 2020-01-02 DIAGNOSIS — I2510 Atherosclerotic heart disease of native coronary artery without angina pectoris: Secondary | ICD-10-CM

## 2020-01-02 MED ORDER — VALSARTAN 160 MG PO TABS: 160.0000 mg | ORAL_TABLET | Freq: Every day | ORAL | 0 refills | Status: DC

## 2020-01-02 MED ORDER — ATORVASTATIN CALCIUM 40 MG PO TABS: 40.0000 mg | ORAL_TABLET | Freq: Every day | ORAL | 0 refills | Status: DC

## 2020-01-02 NOTE — Telephone Encounter (Signed)
Rx has been sent to the pharmacy electronically. ° °

## 2020-01-02 NOTE — Addendum Note (Signed)
Addended by: Barrie Dunker on: 01/02/2020 11:19 AM   Modules accepted: Orders

## 2020-01-02 NOTE — Telephone Encounter (Signed)
New Message   *STAT* If patient is at the pharmacy, call can be transferred to refill team.   1. Which medications need to be refilled? (please list name of each medication and dose if known) atorvastatin (LIPITOR) 40 MG tablet  2. Which pharmacy/location (including street and city if local pharmacy) is medication to be sent to? [id number] - [place], [place] - [address]  3. Do they need a 30 day or 90 day supply? 90    *STAT* If patient is at the pharmacy, call can be transferred to refill team.   1. Which medications need to be refilled? (please list name of each medication and dose if known) valsartan (DIOVAN) 160 MG tablet  2. Which pharmacy/location (including street and city if local pharmacy) is medication to be sent to? [id number] - [place], [place] - [address]  3. Do they need a 30 day or 90 day supply? 90

## 2020-02-11 ENCOUNTER — Ambulatory Visit: Payer: Medicare PPO | Attending: Internal Medicine

## 2020-03-01 ENCOUNTER — Ambulatory Visit: Payer: Medicare Other | Admitting: Cardiovascular Disease

## 2020-03-05 ENCOUNTER — Ambulatory Visit: Payer: Medicare PPO | Admitting: Family Medicine

## 2020-04-19 ENCOUNTER — Other Ambulatory Visit: Payer: Self-pay | Admitting: Cardiovascular Disease

## 2020-04-19 NOTE — Telephone Encounter (Signed)
Rx(s) sent to pharmacy electronically. OV June 05, 2020 with Dr. Royann Shivers

## 2020-06-05 ENCOUNTER — Other Ambulatory Visit: Payer: Self-pay

## 2020-06-05 ENCOUNTER — Ambulatory Visit: Payer: Medicare PPO | Admitting: Cardiovascular Disease

## 2020-06-05 ENCOUNTER — Encounter: Payer: Self-pay | Admitting: Cardiovascular Disease

## 2020-06-05 VITALS — BP 142/78 | HR 70 | Ht 69.0 in | Wt 182.6 lb

## 2020-06-05 DIAGNOSIS — I10 Essential (primary) hypertension: Secondary | ICD-10-CM

## 2020-06-05 DIAGNOSIS — I5181 Takotsubo syndrome: Secondary | ICD-10-CM

## 2020-06-05 DIAGNOSIS — I2510 Atherosclerotic heart disease of native coronary artery without angina pectoris: Secondary | ICD-10-CM | POA: Diagnosis not present

## 2020-06-05 DIAGNOSIS — E7800 Pure hypercholesterolemia, unspecified: Secondary | ICD-10-CM | POA: Diagnosis not present

## 2020-06-05 NOTE — Patient Instructions (Signed)
Medication Instructions:  Continue current medications  *If you need a refill on your cardiac medications before your next appointment, please call your pharmacy*   Lab Work: None Ordered  Testing/Procedures: None ordered   Follow-Up: At [facility], you and your health needs are our priority.  As part of our continuing mission to provide you with exceptional heart care, we have created designated Provider Care Teams.  These Care Teams include your primary Cardiologist (physician) and Advanced Practice Providers (APPs -  Physician Assistants and Nurse Practitioners) who all work together to provide you with the care you need, when you need it.  We recommend signing up for the patient portal called "MyChart".  Sign up information is provided on this After Visit Summary.  MyChart is used to connect with patients for Virtual Visits (Telemedicine).  Patients are able to view lab/test results, encounter notes, upcoming appointments, etc.  Non-urgent messages can be sent to your provider as well.   To learn more about what you can do with MyChart, go to [email or web address].com.    Your next appointment:   1 year(s)  The format for your next appointment:   In Person  Provider:   You may see Mihai Croitoru, MD or one of the following Advanced Practice Providers on your designated Care Team:    Hao Meng, PA-C  Angela Duke, PA-C or   Krista Kroeger, PA-C      

## 2020-06-05 NOTE — Progress Notes (Signed)
Cardiology Office Note:    Date:  06/09/2020   ID:  Brenda Lloyd, DOB October 15, 1951, MRN [id number]  PCP:  [name], MD  Cardiologist:  [name], MD   Referring MD: [name], MD   Chief Complaint  Patient presents with  . Coronary Artery Disease    History of Present Illness:    Brenda Lloyd is a 69 y.o. 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  Cardiac catheterization performed at that time showed an incidental finding of a 65% stenosis in the distal LAD artery.  Her ejection fraction was as low as 25-35%.  Follow-up echocardiogram performed on November 12, 2017 shows complete recovery, with a left ventricular ejection fraction of 55-60%.   She is physically active and feels quite well.  Her cough resolved when replaced her ACE inhibitor with valsartan.  She is busy taking care of her 63-year-old grandson.  She would like to delay getting her labs rechecked next year.  The patient specifically denies any chest pain at rest exertion, dyspnea at rest or with exertion, orthopnea, paroxysmal nocturnal dyspnea, syncope, palpitations, focal neurological deficits, intermittent claudication, lower extremity edema, unexplained weight gain, cough, hemoptysis or wheezing.    Past Medical History:  Diagnosis Date  . Hypertension     Past Surgical History:  Procedure Laterality Date  . LEFT HEART CATH AND CORONARY ANGIOGRAPHY N/A 09/05/2017   Procedure: LEFT HEART CATH AND CORONARY ANGIOGRAPHY;  Surgeon: [name], MD;  Location: [facility] INVASIVE CV LAB;  Service: Cardiovascular;  Laterality: N/A;    Current Medications: Current Meds  Medication Sig  . acetaminophen (TYLENOL) 500 MG tablet Take 1,000 mg by mouth every 6 (six) hours as needed (pain).  Marland Kitchen aspirin EC 81 MG tablet Take 81 mg by mouth daily.  Marland Kitchen atorvastatin (LIPITOR) 40 MG tablet Take 1 tablet (40 mg total) by mouth daily.   . nitroGLYCERIN (NITROSTAT) 0.4 MG SL tablet Place 1 tablet (0.4 mg total) under the tongue every 5 (five) minutes as needed for chest pain.  . valsartan (DIOVAN) 160 MG tablet TAKE 1 TABLET BY MOUTH EVERY DAY     Allergies:   Amoxicillin, Ciprofloxacin, Fluocinolone, Penicillins, Sulfa antibiotics, and Influenza vaccines   Social History   Socioeconomic History  . Marital status: Married    Spouse name: Not on file  . Number of children: Not on file  . Years of education: Not on file  . Highest education level: Not on file  Occupational History  . Not on file  Tobacco Use  . Smoking status: Never Smoker  . Smokeless tobacco: Never Used  Substance and Sexual Activity  . Alcohol use: Yes    Comment: occ  . Drug use: No  . Sexual activity: Not on file  Other Topics Concern  . Not on file  Social History Narrative  . Not on file   Social Determinants of Health   Financial Resource Strain:   . Difficulty of Paying Living Expenses:   Food Insecurity:   . Worried About Programme researcher, broadcasting/film/video in the Last Year:   . Barista in the Last Year:   Transportation Needs:   . Freight forwarder (Medical):   Marland Kitchen Lack of Transportation (Non-Medical):   Physical Activity:   . Days of Exercise per Week:   . Minutes of Exercise per Session:   Stress:   . Feeling of Stress :  Social Connections:   . Frequency of Communication with Friends and Family:   . Frequency of Social Gatherings with Friends and Family:   . Attends Religious Services:   . Active Member of Clubs or Organizations:   . Attends Banker Meetings:   Marland Kitchen Marital Status:      Family History: The patient's family history is significantly negative for premature coronary artery disease ROS:   Please see the history of present illness.     All other systems reviewed and are negative.  EKGs/Labs/Other Studies Reviewed:    The following studies were reviewed today: Echocardiogram from November  2018  EKG:  EKG is ordered today.  Shows normal sinus rhythm with PVCs otherwise normal tracing  Recent Labs: No results found for requested labs within last 8760 hours.  Recent Lipid Panel    Component Value Date/Time   CHOL 144 03/16/2019 0956   TRIG 129 03/16/2019 0956   HDL 44 03/16/2019 0956   CHOLHDL 3.3 03/16/2019 0956   CHOLHDL 5.5 09/04/2017 2105   VLDL 20 09/04/2017 2105   LDLCALC 74 03/16/2019 0956    Physical Exam:    VS:  BP (!) 142/78   Pulse 70   Ht 5\' 9"  (1.753 m)   Wt 182 lb 9.6 oz (82.8 kg)   SpO2 99%   BMI 26.97 kg/m    Recheck BP 133/77 mmHg Wt Readings from Last 3 Encounters:  06/05/20 182 lb 9.6 oz (82.8 kg)  02/17/19 176 lb 6.4 oz (80 kg)  03/22/18 186 lb 9.6 oz (84.6 kg)     General: Alert, oriented x3, no distress, minimally overweight Head: no evidence of trauma, PERRL, EOMI, no exophtalmos or lid lag, no myxedema, no xanthelasma; normal ears, nose and oropharynx Neck: normal jugular venous pulsations and no hepatojugular reflux; brisk carotid pulses without delay and no carotid bruits Chest: clear to auscultation, no signs of consolidation by percussion or palpation, normal fremitus, symmetrical and full respiratory excursions Cardiovascular: normal position and quality of the apical impulse, regular rhythm, normal first and second heart sounds, no murmurs, rubs or gallops Abdomen: no tenderness or distention, no masses by palpation, no abnormal pulsatility or arterial bruits, normal bowel sounds, no hepatosplenomegaly Extremities: no clubbing, cyanosis or edema; 2+ radial, ulnar and brachial pulses bilaterally; 2+ right femoral, posterior tibial and dorsalis pedis pulses; 2+ left femoral, posterior tibial and dorsalis pedis pulses; no subclavian or femoral bruits Neurological: grossly nonfocal Psych: Normal mood and affect   ASSESSMENT:    1. Coronary artery disease involving native coronary artery of native heart without angina pectoris    2. Essential hypertension   3. Hypercholesterolemia   4. History of takotsubo syndrome    PLAN:    In order of problems listed above:  1. CAD: She does not have angina pectoris.  She had incidentally discovered relatively minor coronary artery disease.   The focus is on risk factor modification. 2. HTN: Adequate control on valsartan monotherapy.  Target BP 130/80 or less. 3. HLP: Wants to delay her labs until next year. 4. Takotsubo sd.:  Fully recovered.   Medication Adjustments/Labs and Tests Ordered: Current medicines are reviewed at length with the patient today.  Concerns regarding medicines are outlined above.  Orders Placed This Encounter  Procedures  . EKG 12-Lead   No orders of the defined types were placed in this encounter.  Patient Instructions  Medication Instructions:  Continue current medications  *If you need a refill on your cardiac  medications before your next appointment, please call your pharmacy*   Lab Work: None Ordered   Testing/Procedures: None ordered   Follow-Up: At [facility], you and your health needs are our priority.  As part of our continuing mission to provide you with exceptional heart care, we have created designated Provider Care Teams.  These Care Teams include your primary Cardiologist (physician) and Advanced Practice Providers (APPs -  Physician Assistants and Nurse Practitioners) who all work together to provide you with the care you need, when you need it.  We recommend signing up for the patient portal called "MyChart".  Sign up information is provided on this After Visit Summary.  MyChart is used to connect with patients for Virtual Visits (Telemedicine).  Patients are able to view lab/test results, encounter notes, upcoming appointments, etc.  Non-urgent messages can be sent to your provider as well.   To learn more about what you can do with MyChart, go to [email or web address].    Your next appointment:   1  year(s)  The format for your next appointment:   In Person  Provider:   You may see Sanda Klein, MD or one of the following Advanced Practice Providers on your designated Care Team:    Almyra Deforest, PA-C  Fabian Sharp, [place] or   Roby Lofts, PA-C       Signed, Sanda Klein, MD  06/09/2020 10:10 AM    [employer]

## 2020-06-09 ENCOUNTER — Encounter: Payer: Self-pay | Admitting: Cardiovascular Disease

## 2020-06-26 ENCOUNTER — Other Ambulatory Visit: Payer: Self-pay

## 2020-06-26 DIAGNOSIS — I5181 Takotsubo syndrome: Secondary | ICD-10-CM

## 2020-06-26 DIAGNOSIS — I2510 Atherosclerotic heart disease of native coronary artery without angina pectoris: Secondary | ICD-10-CM

## 2020-06-26 MED ORDER — ATORVASTATIN CALCIUM 40 MG PO TABS: 40.0000 mg | ORAL_TABLET | Freq: Every day | ORAL | 3 refills | Status: DC

## 2020-06-26 NOTE — Telephone Encounter (Signed)
Rx(s) sent to pharmacy electronically.  

## 2020-07-26 ENCOUNTER — Other Ambulatory Visit: Payer: Self-pay | Admitting: Cardiovascular Disease

## 2021-03-22 ENCOUNTER — Other Ambulatory Visit: Payer: Self-pay | Admitting: Cardiovascular Disease

## 2021-03-22 DIAGNOSIS — I5181 Takotsubo syndrome: Secondary | ICD-10-CM

## 2021-03-22 DIAGNOSIS — I2510 Atherosclerotic heart disease of native coronary artery without angina pectoris: Secondary | ICD-10-CM

## 2021-04-29 ENCOUNTER — Telehealth: Payer: Self-pay | Admitting: Cardiovascular Disease

## 2021-04-29 NOTE — Telephone Encounter (Signed)
Pt c/o of Chest Pain: STAT if CP now or developed within 24 hours  1. Are you having CP right now? No not having it right now Chest Tightness   2. Are you experiencing any other symptoms (ex. SOB, nausea, vomiting, sweating)? No  3. How long have you been experiencing CP? Chest Tightness not Chest pain for 3 days  4. Is your CP continuous or coming and going? Coming and going  5. Have you taken Nitroglycerin? No   Pt c/o BP issue: STAT if pt c/o blurred vision, one-sided weakness or slurred speech  1. What are your last 5 BP readings? 111/68 HR 80  2. Are you having any other symptoms (ex. Dizziness, headache, blurred vision, passed out)? No  3. What is your BP issue? Pt feels like she's not her normal self, she feels weak since Saturday evening. Pt said this doesn't feel the same as when she had her first heart attack  ?

## 2021-04-29 NOTE — Telephone Encounter (Signed)
Follow up: ° ° ° °Patient returning call back. Please call patient back. °

## 2021-04-29 NOTE — Telephone Encounter (Signed)
Spoke with pt who report recently she's been experiencing chest tightness whenever she exert herself. She state symptoms aren't the same as when she had her MI but just doesn't feel right. She also report she just feel exhausted.   Appointment scheduled for 5/6 with [facility] for further evalutions. Pt made aware of ED precaution should any new symptoms develop or worsen. Pt verbalized understanding.

## 2021-04-29 NOTE — Telephone Encounter (Signed)
Attempted to contact pt. Unable to leave message as mailbox is full.  °

## 2021-05-02 ENCOUNTER — Ambulatory Visit: Payer: Medicare PPO | Admitting: Physician Assistant

## 2021-05-02 ENCOUNTER — Encounter: Payer: Self-pay | Admitting: Physician Assistant

## 2021-05-02 ENCOUNTER — Other Ambulatory Visit: Payer: Self-pay

## 2021-05-02 VITALS — BP 127/71 | HR 70 | Ht 69.0 in | Wt 183.0 lb

## 2021-05-02 DIAGNOSIS — I2510 Atherosclerotic heart disease of native coronary artery without angina pectoris: Secondary | ICD-10-CM

## 2021-05-02 DIAGNOSIS — I10 Essential (primary) hypertension: Secondary | ICD-10-CM | POA: Diagnosis not present

## 2021-05-02 DIAGNOSIS — E785 Hyperlipidemia, unspecified: Secondary | ICD-10-CM

## 2021-05-02 DIAGNOSIS — Z01812 Encounter for preprocedural laboratory examination: Secondary | ICD-10-CM

## 2021-05-02 DIAGNOSIS — I5181 Takotsubo syndrome: Secondary | ICD-10-CM

## 2021-05-02 DIAGNOSIS — Z01818 Encounter for other preprocedural examination: Secondary | ICD-10-CM | POA: Diagnosis not present

## 2021-05-02 DIAGNOSIS — I25118 Atherosclerotic heart disease of native coronary artery with other forms of angina pectoris: Secondary | ICD-10-CM

## 2021-05-02 MED ORDER — CARVEDILOL 3.125 MG PO TABS: 3.1250 mg | ORAL_TABLET | Freq: Two times a day (BID) | ORAL | 1 refills | Status: DC

## 2021-05-02 NOTE — Progress Notes (Addendum)
Cardiology Office Note:    Date:  05/04/2021   ID:  Brenda Lloyd, DOB 1951/05/07, MRN [id number]  PCP:  [name], MD   [facility] Providers Cardiologist:  [name], MD {  Referring MD: [name], MD   Chief Complaint  Patient presents with  . Chest Pain    Evaluate chest pain    History of Present Illness:    Brenda Lloyd is a 70 y.o. 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  Cardiac catheterization at the time showed a 65% lesion from mid to distal LAD, otherwise no significant blockage to explain her low EF.  Her ejection fraction at the time was 25 to 35%.  Follow-up echocardiogram in November 2018 shows complete recovery with EF improved to 55 to 60%.  She was last seen by Dr. Royann Shivers on 06/05/2020 at which time she was doing well.  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  She mentions she had the first cardiac catheterization while pushing a wheelchair on the baseball field years ago.  Last Saturday, her team had another game and that she was pushing wheelchair again when she started noticing left-sided chest discomfort.  It is not worse with deep inspiration, body rotation on palpation.  Symptom lasted a few minutes before resolving.  When she was pushing another wheelchair a few minute later, the same symptom started again.  Since then, she has been noticing consistent left-sided chest tingling sensation every time she does exertional activity.  Last episode was yesterday while holding her grandbaby and going out to pick up the mail.  EKG today shows T wave inversion in the anterior leads which is new.  Her symptom is concerning for stable angina.  I discussed case with Dr. Royann Shivers, we will add a 3.125 mg twice a day of carvedilol to her medication regimen.  We discussed benefit and risk of cardiac catheterization with her,  she is willing to proceed with cardiac catheterization next Thursday.  Past Medical History:  Diagnosis Date  . Hypertension     Past Surgical History:  Procedure Laterality Date  . LEFT HEART CATH AND CORONARY ANGIOGRAPHY N/A 09/05/2017   Procedure: LEFT HEART CATH AND CORONARY ANGIOGRAPHY;  Surgeon: [name], MD;  Location: [facility] INVASIVE CV LAB;  Service: Cardiovascular;  Laterality: N/A;    Current Medications: Current Meds  Medication Sig  . acetaminophen (TYLENOL) 500 MG tablet Take 1,000 mg by mouth every 6 (six) hours as needed (pain).  Marland Kitchen aspirin EC 81 MG tablet Take 81 mg by mouth daily.  Marland Kitchen atorvastatin (LIPITOR) 40 MG tablet TAKE 1 TABLET(40 MG) BY MOUTH DAILY  . carvedilol (COREG) 3.125 MG tablet Take 1 tablet (3.125 mg total) by mouth 2 (two) times daily with a meal.  . nitroGLYCERIN (NITROSTAT) 0.4 MG SL tablet Place 1 tablet (0.4 mg total) under the tongue every 5 (five) minutes as needed for chest pain.  . valsartan (DIOVAN) 160 MG tablet TAKE 1 TABLET BY MOUTH EVERY DAY     Allergies:   Amoxicillin, Ciprofloxacin, Fluocinolone, Penicillins, Sulfa antibiotics, and Influenza vaccines   Social History   Socioeconomic History  . Marital status: Married    Spouse name: Not on file  . Number of children: Not on file  . Years of education: Not on file  . Highest education level: Not on file  Occupational History  . Not on file  Tobacco  Use  . Smoking status: Never Smoker  . Smokeless tobacco: Never Used  Substance and Sexual Activity  . Alcohol use: Yes    Comment: occ  . Drug use: No  . Sexual activity: Not on file  Other Topics Concern  . Not on file  Social History Narrative  . Not on file   Social Determinants of Health   Financial Resource Strain: Not on file  Food Insecurity: Not on file  Transportation Needs: Not on file  Physical Activity: Not on file  Stress: Not on file  Social Connections: Not on file     Family History: The patient's  family history is not on file.  ROS:   Please see the history of present illness.     All other systems reviewed and are negative.  EKGs/Labs/Other Studies Reviewed:    The following studies were reviewed today:  Cath 09/05/2017  Dist LAD lesion, 65 %stenosed. Diffuse tapering from the mid vessel distally around the apex. The vessel has the appearance of potentially diffuse intramural thrombus.  There is moderate to severe left ventricular systolic dysfunction. The left ventricular ejection fraction is 25-35% by visual estimate. - In a Takotsubo pattern  LV end diastolic pressure is moderately elevated. 22 mmHg  There is moderate (3+) mitral regurgitation.  There is no aortic valve stenosis.   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 There is pruning of the septal branches and area that should be covered with with a diagonal branch is empty.  The patient was somewhat hypotensive due to sedation in the Cath Lab, but does not have the appearance of being a cardiac shock. Her blood pressures were mostly in the 110s. I did start low-dose Levophed to allow for sedation as she became agitated with the ventilator in place.  The patient was transferred back to the CCU for supportive care. ABG looked excellent. I suspect that she would benefit from at least one day of rest and potentially another dose of IV Lasix depending on her pressures.  As her pressure tolerate, we can wean off Levophed and potentially consider adding either amlodipine or Imdur for her definitive diffuse LAD disease.    EKG:  EKG is ordered today.  The ekg ordered today demonstrates normal sinus rhythm, T wave inversion in anterior leads.  Recent Labs: 05/02/2021: BUN 15; Creatinine, Ser 0.69; Hemoglobin 12.8; Platelets 321; Potassium 4.5; Sodium 141  Recent Lipid  Panel    Component Value Date/Time   CHOL 144 03/16/2019 0956   TRIG 129 03/16/2019 0956   HDL 44 03/16/2019 0956   CHOLHDL 3.3 03/16/2019 0956   CHOLHDL 5.5 09/04/2017 2105   VLDL 20 09/04/2017 2105   LDLCALC 74 03/16/2019 0956     Risk Assessment/Calculations:       Physical Exam:    VS:  BP 127/71   Pulse 70   Ht 5\' 9"  (1.753 m)   Wt 183 lb (83 kg)   SpO2 97%   BMI 27.02 kg/m     Wt Readings from Last 3 Encounters:  05/02/21 183 lb (83 kg)  06/05/20 182 lb 9.6 oz (82.8 kg)  02/17/19 176 lb 6.4 oz (80 kg)     GEN:  Well nourished, well developed in no acute distress HEENT: Normal NECK: No JVD; No carotid bruits LYMPHATICS: No lymphadenopathy CARDIAC: RRR, no murmurs, rubs, gallops RESPIRATORY:  Clear  to auscultation without rales, wheezing or rhonchi  ABDOMEN: Soft, non-tender, non-distended MUSCULOSKELETAL:  No edema; No deformity  SKIN: Warm and dry NEUROLOGIC:  Alert and oriented x 3 PSYCHIATRIC:  Normal affect   ASSESSMENT:    1. Coronary artery disease of native artery of native heart with stable angina pectoris (HCC)   2. Pre-procedure lab exam   3. Essential hypertension   4. Hyperlipidemia LDL goal <70   5. Takotsubo cardiomyopathy    PLAN:    In order of problems listed above:  1. CAD: Recent symptoms consistent with stable angina that is worse with exertion and improved with rest.  Previous cardiac catheterization in 2018 showed 65% mid to distal LAD lesion, I discussed the case with DOD Dr. Royann Shivers, we will add carvedilol 3.125 mg twice a day to her medical regiment and arrange cardiac catheterization  - Risk and benefit of procedure explained to the patient who display clear understanding and agree to proceed.  Discussed with patient possible procedural risk include bleeding, vascular injury, renal injury, arrythmia, MI, stroke and loss of limb or life.   2. Hypertension: Add carvedilol 3.125 mg twice a day for antianginal  purposes  3. Hyperlipidemia: Continue Lipitor  4. Takotsubo cardiomyopathy: EF improved back to normal in 2018 based on repeat echocardiogram.      Medication Adjustments/Labs and Tests Ordered: Current medicines are reviewed at length with the patient today.  Concerns regarding medicines are outlined above.  Orders Placed This Encounter  Procedures  . CBC  . Basic metabolic panel  . EKG 12-Lead   Meds ordered this encounter  Medications  . carvedilol (COREG) 3.125 MG tablet    Sig: Take 1 tablet (3.125 mg total) by mouth 2 (two) times daily with a meal.    Dispense:  180 tablet    Refill:  1    Patient Instructions  Medication Instructions:  BEGIN carvedilol 3.125mg  twice a day.  *If you need a refill on your cardiac medications before your next appointment, please call your pharmacy*   Lab Work: CBC, BMet today.  If you have labs (blood work) drawn today and your tests are completely normal, you will receive your results only by: Marland Kitchen MyChart Message (if you have MyChart) OR . A paper copy in the mail If you have any lab test that is abnormal or we need to change your treatment, we will call you to review the results.   Testing/Procedures: Covid test on Monday, May 9 at 2:05pm.    Follow-Up: At [facility], you and your health needs are our priority.  As part of our continuing mission to provide you with exceptional heart care, we have created designated Provider Care Teams.  These Care Teams include your primary Cardiologist (physician) and Advanced Practice Providers (APPs -  Physician Assistants and Nurse Practitioners) who all work together to provide you with the care you need, when you need it.  We recommend signing up for the patient portal called "MyChart".  Sign up information is provided on this After Visit Summary.  MyChart is used to connect with patients for Virtual Visits (Telemedicine).  Patients are able to view lab/test results, encounter notes,  upcoming appointments, etc.  Non-urgent messages can be sent to your provider as well.   To learn more about what you can do with MyChart, go to [email or web address].    Your next appointment:  2-3 weeks  The format for your next appointment:   In Person  Provider:   Rachelle Hora  Croitoru, MD or APP.    Other Instructions    [facility] [address] [place] [address] Dept: [phone] Loc: [phone]  Brenda Lloyd  05/02/2021  You are scheduled for a Cardiac Catheterization on Thursday, May 12 with Dr. Christopher End.  1. Please arrive at the [facility] Tower (Main Entrance A) at [facility]: [address] [place], [place] [address] at 10:00 AM (This time is two hours before your procedure to ensure your preparation). Free valet parking service is available.   Special note: Every effort is made to have your procedure done on time. Please understand that emergencies sometimes delay scheduled procedures.  2. Diet: Do not eat solid foods after midnight.  The patient may have clear liquids until 5am upon the day of the procedure.  3. Labs: You will need to have blood drawn today. Due to COVID-19 restrictions implemented by our local and state authorities and in an effort to keep both patients and staff as safe as possible, our hospital system requires COVID-19 testing prior to certain scheduled hospital procedures.  Please go to [address] [place], [place] [address] on May 9 at  2:05pm  This is a drive up testing site - please tell then you are there for pre-procedure testing.  You will not need to exit your vehicle.  You will not be billed at the time of testing but may receive a bill later depending on your insurance. You must agree to self-quarantine from the time of your testing until the procedure date .  This should included staying home with ONLY the people you live with.   Avoid eating out, grocery store shopping or leaving the house for any non-emergent reason, for example.  Failure to have your COVID-19 test done on the date and time you have been scheduled will result in cancellation of your procedure.  Please call our office at [phone] if you have any questions.     4. Medication instructions in preparation for your procedure: Hold valsartan the morning of your procedure.     On the morning of your procedure, take your Aspirin and any morning medicines NOT listed above.  You may use sips of water.  5. Plan for one night stay--bring personal belongings. 6. Bring a current list of your medications and current insurance cards. 7. You MUST have a responsible person to drive you home. 8. Someone MUST be with you the first 24 hours after you arrive home or your discharge will be delayed. 9. Please wear clothes that are easy to get on and off and wear slip-on shoes.  Thank you for allowing us to care for you!   -- [place] [facility]     Signed, Dillie Burandt, PA  05/04/2021 9:15 PM    [facility] 

## 2021-05-02 NOTE — Patient Instructions (Signed)
Medication Instructions:  BEGIN carvedilol 3.125mg  twice a day.  *If you need a refill on your cardiac medications before your next appointment, please call your pharmacy*   Lab Work: CBC, BMet today.  If you have labs (blood work) drawn today and your tests are completely normal, you will receive your results only by: Marland Kitchen MyChart Message (if you have MyChart) OR . A paper copy in the mail If you have any lab test that is abnormal or we need to change your treatment, we will call you to review the results.   Testing/Procedures: Covid test on Monday, May 9 at 2:05pm.    Follow-Up: At [facility], you and your health needs are our priority.  As part of our continuing mission to provide you with exceptional heart care, we have created designated Provider Care Teams.  These Care Teams include your primary Cardiologist (physician) and Advanced Practice Providers (APPs -  Physician Assistants and Nurse Practitioners) who all work together to provide you with the care you need, when you need it.  We recommend signing up for the patient portal called "MyChart".  Sign up information is provided on this After Visit Summary.  MyChart is used to connect with patients for Virtual Visits (Telemedicine).  Patients are able to view lab/test results, encounter notes, upcoming appointments, etc.  Non-urgent messages can be sent to your provider as well.   To learn more about what you can do with MyChart, go to [email or web address].    Your next appointment:  2-3 weeks  The format for your next appointment:   In Person  Provider:   [name], MD or APP.    Other Instructions    [facility] CARDIOVASCULAR DIVISION [facility] [address] [place] [address] Dept: [phone] Loc: [phone]  Brenda Lloyd  05/02/2021  You are scheduled for a Cardiac Catheterization on Thursday, May 12 with Dr. Cristal Deer End.  1.  Please arrive at the [facility] (Main Entrance A) at [facility]: [address] [place], [place] [address] at 10:00 AM (This time is two hours before your procedure to ensure your preparation). Free valet parking service is available.   Special note: Every effort is made to have your procedure done on time. Please understand that emergencies sometimes delay scheduled procedures.  2. Diet: Do not eat solid foods after midnight.  The patient may have clear liquids until 5am upon the day of the procedure.  3. Labs: You will need to have blood drawn today. Due to COVID-19 restrictions implemented by our local and state authorities and in an effort to keep both patients and staff as safe as possible, our hospital system requires COVID-19 testing prior to certain scheduled hospital procedures.  Please go to [address] [place], [place] [address] on May 9 at  2:05pm  This is a drive up testing site - please tell then you are there for pre-procedure testing.  You will not need to exit your vehicle.  You will not be billed at the time of testing but may receive a bill later depending on your insurance. You must agree to self-quarantine from the time of your testing until the procedure date .  This should included staying home with ONLY the people you live with.  Avoid eating out, grocery store shopping or leaving the house for any non-emergent reason, for example.  Failure to have your COVID-19 test done on the date and time you have been  scheduled will result in cancellation of your procedure.  Please call our office at [phone] if you have any questions.     4. Medication instructions in preparation for your procedure: Hold valsartan the morning of your procedure.     On the morning of your procedure, take your Aspirin and any morning medicines NOT listed above.  You may use sips of water.  5. Plan for one night stay--bring personal belongings. 6. Bring a current list of your  medications and current insurance cards. 7. You MUST have a responsible person to drive you home. 8. Someone MUST be with you the first 24 hours after you arrive home or your discharge will be delayed. 9. Please wear clothes that are easy to get on and off and wear slip-on shoes.  Thank you for allowing [place] to care for you!   -- [place] [facility]

## 2021-05-02 NOTE — H&P (View-Only) (Signed)
Cardiology Office Note:    Date:  05/04/2021   ID:  Brenda Lloyd, DOB 1951/05/07, MRN [id number]  PCP:  [name], MD   [facility] Providers Cardiologist:  [name], MD {  Referring MD: [name], MD   Chief Complaint  Patient presents with  . Chest Pain    Evaluate chest pain    History of Present Illness:    Brenda Lloyd is a 70 y.o. 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  Cardiac catheterization at the time showed a 65% lesion from mid to distal LAD, otherwise no significant blockage to explain her low EF.  Her ejection fraction at the time was 25 to 35%.  Follow-up echocardiogram in November 2018 shows complete recovery with EF improved to 55 to 60%.  She was last seen by Dr. Royann Shivers on 06/05/2020 at which time she was doing well.  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  She mentions she had the first cardiac catheterization while pushing a wheelchair on the baseball field years ago.  Last Saturday, her team had another game and that she was pushing wheelchair again when she started noticing left-sided chest discomfort.  It is not worse with deep inspiration, body rotation on palpation.  Symptom lasted a few minutes before resolving.  When she was pushing another wheelchair a few minute later, the same symptom started again.  Since then, she has been noticing consistent left-sided chest tingling sensation every time she does exertional activity.  Last episode was yesterday while holding her grandbaby and going out to pick up the mail.  EKG today shows T wave inversion in the anterior leads which is new.  Her symptom is concerning for stable angina.  I discussed case with Dr. Royann Shivers, we will add a 3.125 mg twice a day of carvedilol to her medication regimen.  We discussed benefit and risk of cardiac catheterization with her,  she is willing to proceed with cardiac catheterization next Thursday.  Past Medical History:  Diagnosis Date  . Hypertension     Past Surgical History:  Procedure Laterality Date  . LEFT HEART CATH AND CORONARY ANGIOGRAPHY N/A 09/05/2017   Procedure: LEFT HEART CATH AND CORONARY ANGIOGRAPHY;  Surgeon: [name], MD;  Location: [facility] INVASIVE CV LAB;  Service: Cardiovascular;  Laterality: N/A;    Current Medications: Current Meds  Medication Sig  . acetaminophen (TYLENOL) 500 MG tablet Take 1,000 mg by mouth every 6 (six) hours as needed (pain).  Marland Kitchen aspirin EC 81 MG tablet Take 81 mg by mouth daily.  Marland Kitchen atorvastatin (LIPITOR) 40 MG tablet TAKE 1 TABLET(40 MG) BY MOUTH DAILY  . carvedilol (COREG) 3.125 MG tablet Take 1 tablet (3.125 mg total) by mouth 2 (two) times daily with a meal.  . nitroGLYCERIN (NITROSTAT) 0.4 MG SL tablet Place 1 tablet (0.4 mg total) under the tongue every 5 (five) minutes as needed for chest pain.  . valsartan (DIOVAN) 160 MG tablet TAKE 1 TABLET BY MOUTH EVERY DAY     Allergies:   Amoxicillin, Ciprofloxacin, Fluocinolone, Penicillins, Sulfa antibiotics, and Influenza vaccines   Social History   Socioeconomic History  . Marital status: Married    Spouse name: Not on file  . Number of children: Not on file  . Years of education: Not on file  . Highest education level: Not on file  Occupational History  . Not on file  Tobacco  Use  . Smoking status: Never Smoker  . Smokeless tobacco: Never Used  Substance and Sexual Activity  . Alcohol use: Yes    Comment: occ  . Drug use: No  . Sexual activity: Not on file  Other Topics Concern  . Not on file  Social History Narrative  . Not on file   Social Determinants of Health   Financial Resource Strain: Not on file  Food Insecurity: Not on file  Transportation Needs: Not on file  Physical Activity: Not on file  Stress: Not on file  Social Connections: Not on file     Family History: The patient's  family history is not on file.  ROS:   Please see the history of present illness.     All other systems reviewed and are negative.  EKGs/Labs/Other Studies Reviewed:    The following studies were reviewed today:  Cath 09/05/2017  Dist LAD lesion, 65 %stenosed. Diffuse tapering from the mid vessel distally around the apex. The vessel has the appearance of potentially diffuse intramural thrombus.  There is moderate to severe left ventricular systolic dysfunction. The left ventricular ejection fraction is 25-35% by visual estimate. - In a Takotsubo pattern  LV end diastolic pressure is moderately elevated. 22 mmHg  There is moderate (3+) mitral regurgitation.  There is no aortic valve stenosis.   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 There is pruning of the septal branches and area that should be covered with with a diagonal branch is empty.  The patient was somewhat hypotensive due to sedation in the Cath Lab, but does not have the appearance of being a cardiac shock. Her blood pressures were mostly in the 110s. I did start low-dose Levophed to allow for sedation as she became agitated with the ventilator in place.  The patient was transferred back to the CCU for supportive care. ABG looked excellent. I suspect that she would benefit from at least one day of rest and potentially another dose of IV Lasix depending on her pressures.  As her pressure tolerate, we can wean off Levophed and potentially consider adding either amlodipine or Imdur for her definitive diffuse LAD disease.    EKG:  EKG is ordered today.  The ekg ordered today demonstrates normal sinus rhythm, T wave inversion in anterior leads.  Recent Labs: 05/02/2021: BUN 15; Creatinine, Ser 0.69; Hemoglobin 12.8; Platelets 321; Potassium 4.5; Sodium 141  Recent Lipid  Panel    Component Value Date/Time   CHOL 144 03/16/2019 0956   TRIG 129 03/16/2019 0956   HDL 44 03/16/2019 0956   CHOLHDL 3.3 03/16/2019 0956   CHOLHDL 5.5 09/04/2017 2105   VLDL 20 09/04/2017 2105   LDLCALC 74 03/16/2019 0956     Risk Assessment/Calculations:       Physical Exam:    VS:  BP 127/71   Pulse 70   Ht 5\' 9"  (1.753 m)   Wt 183 lb (83 kg)   SpO2 97%   BMI 27.02 kg/m     Wt Readings from Last 3 Encounters:  05/02/21 183 lb (83 kg)  06/05/20 182 lb 9.6 oz (82.8 kg)  02/17/19 176 lb 6.4 oz (80 kg)     GEN:  Well nourished, well developed in no acute distress HEENT: Normal NECK: No JVD; No carotid bruits LYMPHATICS: No lymphadenopathy CARDIAC: RRR, no murmurs, rubs, gallops RESPIRATORY:  Clear  to auscultation without rales, wheezing or rhonchi  ABDOMEN: Soft, non-tender, non-distended MUSCULOSKELETAL:  No edema; No deformity  SKIN: Warm and dry NEUROLOGIC:  Alert and oriented x 3 PSYCHIATRIC:  Normal affect   ASSESSMENT:    1. Coronary artery disease of native artery of native heart with stable angina pectoris (HCC)   2. Pre-procedure lab exam   3. Essential hypertension   4. Hyperlipidemia LDL goal <70   5. Takotsubo cardiomyopathy    PLAN:    In order of problems listed above:  1. CAD: Recent symptoms consistent with stable angina that is worse with exertion and improved with rest.  Previous cardiac catheterization in 2018 showed 65% mid to distal LAD lesion, I discussed the case with DOD Dr. Royann Shivers, we will add carvedilol 3.125 mg twice a day to her medical regiment and arrange cardiac catheterization  - Risk and benefit of procedure explained to the patient who display clear understanding and agree to proceed.  Discussed with patient possible procedural risk include bleeding, vascular injury, renal injury, arrythmia, MI, stroke and loss of limb or life.   2. Hypertension: Add carvedilol 3.125 mg twice a day for antianginal  purposes  3. Hyperlipidemia: Continue Lipitor  4. Takotsubo cardiomyopathy: EF improved back to normal in 2018 based on repeat echocardiogram.      Medication Adjustments/Labs and Tests Ordered: Current medicines are reviewed at length with the patient today.  Concerns regarding medicines are outlined above.  Orders Placed This Encounter  Procedures  . CBC  . Basic metabolic panel  . EKG 12-Lead   Meds ordered this encounter  Medications  . carvedilol (COREG) 3.125 MG tablet    Sig: Take 1 tablet (3.125 mg total) by mouth 2 (two) times daily with a meal.    Dispense:  180 tablet    Refill:  1    Patient Instructions  Medication Instructions:  BEGIN carvedilol 3.125mg  twice a day.  *If you need a refill on your cardiac medications before your next appointment, please call your pharmacy*   Lab Work: CBC, BMet today.  If you have labs (blood work) drawn today and your tests are completely normal, you will receive your results only by: Marland Kitchen MyChart Message (if you have MyChart) OR . A paper copy in the mail If you have any lab test that is abnormal or we need to change your treatment, we will call you to review the results.   Testing/Procedures: Covid test on Monday, May 9 at 2:05pm.    Follow-Up: At [facility], you and your health needs are our priority.  As part of our continuing mission to provide you with exceptional heart care, we have created designated Provider Care Teams.  These Care Teams include your primary Cardiologist (physician) and Advanced Practice Providers (APPs -  Physician Assistants and Nurse Practitioners) who all work together to provide you with the care you need, when you need it.  We recommend signing up for the patient portal called "MyChart".  Sign up information is provided on this After Visit Summary.  MyChart is used to connect with patients for Virtual Visits (Telemedicine).  Patients are able to view lab/test results, encounter notes,  upcoming appointments, etc.  Non-urgent messages can be sent to your provider as well.   To learn more about what you can do with MyChart, go to [email or web address].    Your next appointment:  2-3 weeks  The format for your next appointment:   In Person  Provider:   Rachelle Hora  Croitoru, MD or APP.    Other Instructions    [facility] [address] [place] [place] [address] Dept: [phone] Loc: [phone]  Jasmine AweCharlotte D Soots  05/02/2021  You are scheduled for a Cardiac Catheterization on Thursday, May 12 with Dr. Cristal Deerhristopher End.  1. [facility]: [address] [place], [place] [address] at 10:00 AM (This time is two hours before your procedure to ensure your preparation). Free valet parking service is available.   Special note: Every effort is made to have your procedure done on time. Please understand that emergencies sometimes delay scheduled procedures.  2. Diet: Do not eat solid foods after midnight.  The patient may have clear liquids until 5am upon the day of the procedure.  3. Labs: You will need to have blood drawn today. Due to COVID-19 restrictions implemented by our local and state authorities and in an effort to keep both patients and staff as safe as possible, our hospital system requires COVID-19 testing prior to certain scheduled hospital procedures.  Please go to [address] [place], [place] [address] on May 9 at  2:05pm  This is a drive up testing site - please tell then you are there for pre-procedure testing.  You will not need to exit your vehicle.  You will not be billed at the time of testing but may receive a bill later depending on your insurance. You must agree to self-quarantine from the time of your testing until the procedure date .  This should included staying home with ONLY the people you live with.   Avoid eating out, grocery store shopping or leaving the house for any non-emergent reason, for example.  Failure to have your COVID-19 test done on the date and time you have been scheduled will result in cancellation of your procedure.  Please call our office at [phone] if you have any questions.     4. Medication instructions in preparation for your procedure: Hold valsartan the morning of your procedure.     On the morning of your procedure, take your Aspirin and any morning medicines NOT listed above.  You may use sips of water.  5. Plan for one night stay--bring personal belongings. 6. Bring a current list of your medications and current insurance cards. 7. You MUST have a responsible person to drive you home. 8. Someone MUST be with you the first 24 hours after you arrive home or your discharge will be delayed. 9. Please wear clothes that are easy to get on and off and wear slip-on shoes.  Thank you for allowing us to care for you!   -- [facility]     Signed, Azalee CourseHao Spiros [place], [place]  05/04/2021 9:15 PM    [facility]

## 2021-05-03 LAB — CBC
Hematocrit: 37.6 % (ref 34.0–46.6)
Hemoglobin: 12.8 g/dL (ref 11.1–15.9)
MCH: 28.8 pg (ref 26.6–33.0)
MCHC: 34 g/dL (ref 31.5–35.7)
MCV: 85 fL (ref 79–97)
Platelets: 321 10*3/uL (ref 150–450)
RBC: 4.44 x10E6/uL (ref 3.77–5.28)
RDW: 12.7 % (ref 11.7–15.4)
WBC: 8.1 10*3/uL (ref 3.4–10.8)

## 2021-05-03 LAB — BASIC METABOLIC PANEL
BUN/Creatinine Ratio: 22 (ref 12–28)
BUN: 15 mg/dL (ref 8–27)
CO2: 22 mmol/L (ref 20–29)
Calcium: 10.1 mg/dL (ref 8.7–10.3)
Chloride: 103 mmol/L (ref 96–106)
Creatinine, Ser: 0.69 mg/dL (ref 0.57–1.00)
Glucose: 104 mg/dL — ABNORMAL HIGH (ref 65–99)
Potassium: 4.5 mmol/L (ref 3.5–5.2)
Sodium: 141 mmol/L (ref 134–144)
eGFR: 94 mL/min/{1.73_m2} (ref >59)

## 2021-05-04 ENCOUNTER — Other Ambulatory Visit: Payer: Self-pay | Admitting: Physician Assistant

## 2021-05-04 ENCOUNTER — Encounter: Payer: Self-pay | Admitting: Physician Assistant

## 2021-05-04 MED ORDER — SODIUM CHLORIDE 0.9% FLUSH: 3.0000 mL | Freq: Two times a day (BID) | INTRAVENOUS | Status: AC

## 2021-05-05 ENCOUNTER — Other Ambulatory Visit (HOSPITAL_COMMUNITY)
Admission: RE | Admit: 2021-05-05 | Discharge: 2021-05-05 | Disposition: A | Discharge status: Home / Self Care | Payer: Medicare PPO | Source: Ambulatory Visit | Attending: Internal Medicine | Admitting: Internal Medicine

## 2021-05-05 DIAGNOSIS — Z01812 Encounter for preprocedural laboratory examination: Secondary | ICD-10-CM | POA: Insufficient documentation

## 2021-05-05 DIAGNOSIS — Z20822 Contact with and (suspected) exposure to covid-19: Secondary | ICD-10-CM | POA: Diagnosis not present

## 2021-05-06 ENCOUNTER — Encounter: Payer: Self-pay | Admitting: *Deleted

## 2021-05-06 LAB — SARS CORONAVIRUS 2 (TAT 6-24 HRS): SARS Coronavirus 2: NEGATIVE

## 2021-05-06 NOTE — Progress Notes (Signed)
Normal red blood cell count, stable renal function and electrolyte

## 2021-05-07 ENCOUNTER — Telehealth: Payer: Self-pay | Admitting: *Deleted

## 2021-05-07 ENCOUNTER — Telehealth: Payer: Self-pay | Admitting: Cardiovascular Disease

## 2021-05-07 NOTE — Telephone Encounter (Signed)
Patient made aware and verbalized her understanding:  Pt contacted pre-catheterization scheduled at [facility] for: Thursday May 08, 2021 12 noon Verified arrival time and place: [facility] Main Entrance A [facility]) at: 10 AM   No solid food after midnight prior to cath, clear liquids until 5 AM day of procedure.   AM meds can be  taken pre-cath with sips of water including: ASA 81 mg   Confirmed patient has responsible adult to drive home post procedure and be with patient first 24 hours after arriving home:  You are allowed ONE visitor in the waiting room during the time you are at the hospital for your procedure. Both you and your visitor must wear a mask once you enter the hospital.

## 2021-05-07 NOTE — Telephone Encounter (Signed)
Return call placed to patient, mailbox full.

## 2021-05-07 NOTE — Telephone Encounter (Addendum)
Pt contacted pre-catheterization scheduled at [facility] for: Thursday May 08, 2021 12 noon Verified arrival time and place: [facility] Main Entrance A [facility]) at: 10 AM   No solid food after midnight prior to cath, clear liquids until 5 AM day of procedure.   AM meds can be  taken pre-cath with sips of water including: ASA 81 mg   Confirmed patient has responsible adult to drive home post procedure and be with patient first 24 hours after arriving home: yes   You are allowed ONE visitor in the waiting room during the time you are at the hospital for your procedure. Both you and your visitor must wear a mask once you enter the hospital. *

## 2021-05-07 NOTE — Telephone Encounter (Signed)
Pt is returning call in regards to her instructions for an upcoming surgery. Please advise

## 2021-05-07 NOTE — Telephone Encounter (Signed)
Return call placed to patient, mailbox full, unable to leave a message.

## 2021-05-07 NOTE — Telephone Encounter (Signed)
Patient is returning call to discuss procedure instructions. ?

## 2021-05-07 NOTE — Telephone Encounter (Signed)
I spoke with patient and reviewed procedure/mask/visitor instructions with patient.      *Patient asked if she could have more than one visitor,(her husband and her grandson),  wait in the waiting area during the time she is there for the procedure.         Patient is aware that [facility] Health Procedural Patient visitor policy is may have only one family member or support person with them. That person may remain in the  waiting area during the procedure.      Patient thanked me for the information, states she will make plans knowing that she will only be allowed one visitor wait in the waiting area during the time she is there for the procedure.       Patient knows her family member or support person allowed to wait in the waiting area should be responsible adult over 23.

## 2021-05-08 ENCOUNTER — Ambulatory Visit (HOSPITAL_COMMUNITY)
Admission: RE | Admit: 2021-05-08 | Discharge: 2021-05-08 | Disposition: A | Discharge status: Home / Self Care | Payer: Medicare PPO | Attending: Internal Medicine | Admitting: Internal Medicine

## 2021-05-08 ENCOUNTER — Other Ambulatory Visit: Payer: Self-pay

## 2021-05-08 ENCOUNTER — Encounter (HOSPITAL_COMMUNITY)
Admission: RE | Disposition: A | Discharge status: Home / Self Care | Payer: Self-pay | Source: Home / Self Care | Attending: Internal Medicine

## 2021-05-08 DIAGNOSIS — Z79899 Other long term (current) drug therapy: Secondary | ICD-10-CM | POA: Diagnosis not present

## 2021-05-08 DIAGNOSIS — I200 Unstable angina: Secondary | ICD-10-CM

## 2021-05-08 DIAGNOSIS — Z880 Allergy status to penicillin: Secondary | ICD-10-CM | POA: Diagnosis not present

## 2021-05-08 DIAGNOSIS — E785 Hyperlipidemia, unspecified: Secondary | ICD-10-CM | POA: Insufficient documentation

## 2021-05-08 DIAGNOSIS — I5181 Takotsubo syndrome: Secondary | ICD-10-CM | POA: Diagnosis not present

## 2021-05-08 DIAGNOSIS — Z882 Allergy status to sulfonamides status: Secondary | ICD-10-CM | POA: Insufficient documentation

## 2021-05-08 DIAGNOSIS — I25110 Atherosclerotic heart disease of native coronary artery with unstable angina pectoris: Secondary | ICD-10-CM | POA: Insufficient documentation

## 2021-05-08 DIAGNOSIS — Z7982 Long term (current) use of aspirin: Secondary | ICD-10-CM | POA: Diagnosis not present

## 2021-05-08 DIAGNOSIS — Z887 Allergy status to serum and vaccine status: Secondary | ICD-10-CM | POA: Insufficient documentation

## 2021-05-08 DIAGNOSIS — I10 Essential (primary) hypertension: Secondary | ICD-10-CM | POA: Insufficient documentation

## 2021-05-08 HISTORY — PX: LEFT HEART CATH AND CORONARY ANGIOGRAPHY: CATH118249

## 2021-05-08 SURGERY — LEFT HEART CATH AND CORONARY ANGIOGRAPHY
Anesthesia: LOCAL

## 2021-05-08 MED ORDER — VERAPAMIL HCL 2.5 MG/ML IV SOLN
INTRAVENOUS | Status: DC | PRN
  Administered 2021-05-08: 10 mL via INTRA_ARTERIAL

## 2021-05-08 MED ORDER — MIDAZOLAM HCL 2 MG/2ML IJ SOLN
INTRAMUSCULAR | Status: AC
  Filled 2021-05-08: qty 2

## 2021-05-08 MED ORDER — ACETAMINOPHEN 325 MG PO TABS: 650.0000 mg | ORAL_TABLET | ORAL | Status: DC | PRN

## 2021-05-08 MED ORDER — MIDAZOLAM HCL 2 MG/2ML IJ SOLN
INTRAMUSCULAR | Status: DC | PRN
  Administered 2021-05-08: 1 mg via INTRAVENOUS

## 2021-05-08 MED ORDER — HEPARIN (PORCINE) IN NACL 1000-0.9 UT/500ML-% IV SOLN
INTRAVENOUS | Status: AC
  Filled 2021-05-08: qty 1000

## 2021-05-08 MED ORDER — LIDOCAINE HCL (PF) 1 % IJ SOLN
INTRAMUSCULAR | Status: DC | PRN
  Administered 2021-05-08: 2 mL

## 2021-05-08 MED ORDER — VERAPAMIL HCL 2.5 MG/ML IV SOLN
INTRAVENOUS | Status: AC
  Filled 2021-05-08: qty 2

## 2021-05-08 MED ORDER — ASPIRIN 81 MG PO CHEW: 81.0000 mg | CHEWABLE_TABLET | ORAL | Status: DC

## 2021-05-08 MED ORDER — SODIUM CHLORIDE 0.9 % WEIGHT BASED INFUSION: 1.0000 mL/kg/h | INTRAVENOUS | Status: DC

## 2021-05-08 MED ORDER — SODIUM CHLORIDE 0.9 % IV SOLN: 250.0000 mL | INTRAVENOUS | Status: DC | PRN

## 2021-05-08 MED ORDER — ONDANSETRON HCL 4 MG/2ML IJ SOLN: 4.0000 mg | Freq: Four times a day (QID) | INTRAMUSCULAR | Status: DC | PRN

## 2021-05-08 MED ORDER — SODIUM CHLORIDE 0.9% FLUSH: 3.0000 mL | INTRAVENOUS | Status: DC | PRN

## 2021-05-08 MED ORDER — FENTANYL CITRATE (PF) 100 MCG/2ML IJ SOLN
INTRAMUSCULAR | Status: DC | PRN
  Administered 2021-05-08: 25 ug via INTRAVENOUS

## 2021-05-08 MED ORDER — FENTANYL CITRATE (PF) 100 MCG/2ML IJ SOLN
INTRAMUSCULAR | Status: AC
  Filled 2021-05-08: qty 2

## 2021-05-08 MED ORDER — HYDRALAZINE HCL 20 MG/ML IJ SOLN: 10.0000 mg | INTRAMUSCULAR | Status: DC | PRN

## 2021-05-08 MED ORDER — LABETALOL HCL 5 MG/ML IV SOLN: 10.0000 mg | INTRAVENOUS | Status: DC | PRN

## 2021-05-08 MED ORDER — IOHEXOL 350 MG/ML SOLN
INTRAVENOUS | Status: DC | PRN
  Administered 2021-05-08: 35 mL

## 2021-05-08 MED ORDER — HEPARIN (PORCINE) IN NACL 1000-0.9 UT/500ML-% IV SOLN
INTRAVENOUS | Status: DC | PRN
  Administered 2021-05-08 (×2): 500 mL

## 2021-05-08 MED ORDER — SODIUM CHLORIDE 0.9% FLUSH: 3.0000 mL | Freq: Two times a day (BID) | INTRAVENOUS | Status: DC

## 2021-05-08 MED ORDER — SODIUM CHLORIDE 0.9 % IV SOLN: INTRAVENOUS | Status: DC

## 2021-05-08 MED ORDER — SODIUM CHLORIDE 0.9 % WEIGHT BASED INFUSION
3.0000 mL/kg/h | INTRAVENOUS | Status: AC
  Administered 2021-05-08: 3 mL/kg/h via INTRAVENOUS

## 2021-05-08 MED ORDER — LIDOCAINE HCL (PF) 1 % IJ SOLN
INTRAMUSCULAR | Status: AC
  Filled 2021-05-08: qty 30

## 2021-05-08 MED ORDER — HEPARIN SODIUM (PORCINE) 1000 UNIT/ML IJ SOLN
INTRAMUSCULAR | Status: DC | PRN
  Administered 2021-05-08: 4000 [IU] via INTRAVENOUS

## 2021-05-08 SURGICAL SUPPLY — 9 items
CATH 5FR JL3.5 JR4 ANG PIG MP (CATHETERS) ×1 IMPLANT
DEVICE RAD TR BAND REGULAR (VASCULAR PRODUCTS) ×1 IMPLANT
GLIDESHEATH SLEND SS 6F .021 (SHEATH) ×1 IMPLANT
GUIDEWIRE INQWIRE 1.5J.035X260 (WIRE) IMPLANT
INQWIRE 1.5J .035X260CM (WIRE) ×2
KIT HEART LEFT (KITS) ×2 IMPLANT
PACK CARDIAC CATHETERIZATION (CUSTOM PROCEDURE TRAY) ×2 IMPLANT
TRANSDUCER W/STOPCOCK (MISCELLANEOUS) ×2 IMPLANT
TUBING CIL FLEX 10 FLL-RA (TUBING) ×2 IMPLANT

## 2021-05-08 NOTE — Discharge Instructions (Signed)
Radial Site Care  This sheet gives you information about how to care for yourself after your procedure. Your health care provider may also give you more specific instructions. If you have problems or questions, contact your health care provider. What can I expect after the procedure? After the procedure, it is common to have:  Bruising and tenderness at the catheter insertion area. Follow these instructions at home: Medicines  Take over-the-counter and prescription medicines only as told by your health care provider. Insertion site care 1. Follow instructions from your health care provider about how to take care of your insertion site. Make sure you: ? Wash your hands with soap and water before you remove your bandage (dressing). If soap and water are not available, use hand sanitizer. ? May remove dressing in 24 hours. 2. Check your insertion site every day for signs of infection. Check for: ? Redness, swelling, or pain. ? Fluid or blood. ? Pus or a bad smell. ? Warmth. 3. Do no take baths, swim, or use a hot tub for 5 days. 4. You may shower 24-48 hours after the procedure. ? Remove the dressing and gently wash the site with plain soap and water. ? Pat the area dry with a clean towel. ? Do not rub the site. That could cause bleeding. 5. Do not apply powder or lotion to the site. Activity  1. For 24 hours after the procedure, or as directed by your health care provider: ? Do not flex or bend the affected arm. ? Do not push or pull heavy objects with the affected arm. ? Do not drive yourself home from the hospital or clinic. You may drive 24 hours after the procedure. ? Do not operate machinery or power tools. ? KEEP ARM ELEVATED THE REMAINDER OF THE DAY. 2. Do not push, pull or lift anything that is heavier than 10 lb for 5 days. 3. Ask your health care provider when it is okay to: ? Return to work or school. ? Resume usual physical activities or sports. ? Resume sexual  activity. General instructions  If the catheter site starts to bleed, raise your arm and put firm pressure on the site. If the bleeding does not stop, get help right away. This is a medical emergency.  DRINK PLENTY OF FLUIDS FOR THE NEXT 2-3 DAYS.  No alcohol consumption for 24 hours after receiving sedation.  If you went home on the same day as your procedure, a responsible adult should be with you for the first 24 hours after you arrive home.  Keep all follow-up visits as told by your health care provider. This is important. Contact a health care provider if:  You have a fever.  You have redness, swelling, or yellow drainage around your insertion site. Get help right away if:  You have unusual pain at the radial site.  The catheter insertion area swells very fast.  The insertion area is bleeding, and the bleeding does not stop when you hold steady pressure on the area.  Your arm or hand becomes pale, cool, tingly, or numb. These symptoms may represent a serious problem that is an emergency. Do not wait to see if the symptoms will go away. Get medical help right away. Call your local emergency services (911 in the [place] Do not drive yourself to the hospital. Summary  After the procedure, it is common to have bruising and tenderness at the site.  Follow instructions from your health care provider about how to take care   of your radial site wound. Check the wound every day for signs of infection.  This information is not intended to replace advice given to you by your health care provider. Make sure you discuss any questions you have with your health care provider. Document Revised: 01/19/2018 Document Reviewed: 01/19/2018 Elsevier Patient Education  2020 [employer] 

## 2021-05-08 NOTE — Interval H&P Note (Signed)
History and Physical Interval Note:  05/08/2021 12:02 PM  TAUNIA FRASCO  has presented today for surgery, with the diagnosis of coronary artery disease with accelerating angina.  The various methods of treatment have been discussed with the patient and family. After consideration of risks, benefits and other options for treatment, the patient has consented to  Procedure(s): LEFT HEART CATH AND CORONARY ANGIOGRAPHY (N/A) as a surgical intervention.  The patient's history has been reviewed, patient examined, no change in status, stable for surgery.  I have reviewed the patient's chart and labs.  Questions were answered to the patient's satisfaction.    Cath Lab Visit (complete for each Cath Lab visit)  Clinical Evaluation Leading to the Procedure:   ACS: No.  Non-ACS:    Anginal Classification: CCS II  Anti-ischemic medical therapy: Minimal Therapy (1 class of medications)  Non-Invasive Test Results: No non-invasive testing performed  Prior CABG: No previous CABG  Brenda Lloyd

## 2021-05-08 NOTE — Telephone Encounter (Signed)
See other phone note 05/07/21.

## 2021-05-08 NOTE — Brief Op Note (Signed)
BRIEF CARDIAC CATHETERIZATION NOTE  05/08/2021  12:57 PM  PATIENT:  Brenda Lloyd  70 y.o. female  PRE-OPERATIVE DIAGNOSIS:  Accelerating angina  POST-OPERATIVE DIAGNOSIS:  Same PROCEDURE:  Procedure(s): LEFT HEART CATH AND CORONARY ANGIOGRAPHY (N/A)  SURGEON:  Surgeon(s) and Role:    * Brita Jurgensen, Cristal Deer, MD - Primary  FINDINGS: 1. No angiographically significant coronary artery disease.  There is tapering of the distal LAD, though a focal stenosis is not observed. 2. Normal left ventricular contraction with mildly elevated filling pressure.  RECOMMENDATIONS: 1. Continue medical therapy, including recently added carvedilol.  [name], MD [facility]

## 2021-05-08 NOTE — Progress Notes (Signed)
Pt ambulated without difficulty or bleeding.   Discharged home with husband who will drive and stay with pt x 24 hrs  

## 2021-05-09 ENCOUNTER — Encounter (HOSPITAL_COMMUNITY): Payer: Self-pay | Admitting: Internal Medicine

## 2021-05-23 ENCOUNTER — Other Ambulatory Visit: Payer: Self-pay

## 2021-05-23 ENCOUNTER — Encounter: Payer: Self-pay | Admitting: Cardiovascular Disease

## 2021-05-23 ENCOUNTER — Ambulatory Visit: Payer: Medicare PPO | Admitting: Cardiovascular Disease

## 2021-05-23 VITALS — BP 102/70 | HR 54 | Ht 69.0 in | Wt 183.0 lb

## 2021-05-23 DIAGNOSIS — I5181 Takotsubo syndrome: Secondary | ICD-10-CM | POA: Diagnosis not present

## 2021-05-23 DIAGNOSIS — E7800 Pure hypercholesterolemia, unspecified: Secondary | ICD-10-CM | POA: Diagnosis not present

## 2021-05-23 DIAGNOSIS — I25118 Atherosclerotic heart disease of native coronary artery with other forms of angina pectoris: Secondary | ICD-10-CM

## 2021-05-23 DIAGNOSIS — I10 Essential (primary) hypertension: Secondary | ICD-10-CM

## 2021-05-23 MED ORDER — AMLODIPINE BESYLATE 2.5 MG PO TABS: 2.5000 mg | ORAL_TABLET | Freq: Every day | ORAL | 1 refills | Status: DC

## 2021-05-23 NOTE — Patient Instructions (Signed)
Medication Instructions:  STOP the Valsartan  START Amlodipine 2.5 mg once daily  *If you need a refill on your cardiac medications before your next appointment, please call your pharmacy*   Lab Work: None ordered If you have labs (blood work) drawn today and your tests are completely normal, you will receive your results only by: Marland Kitchen MyChart Message (if you have MyChart) OR . A paper copy in the mail If you have any lab test that is abnormal or we need to change your treatment, we will call you to review the results.   Testing/Procedures: None ordered   Follow-Up: At [facility], you and your health needs are our priority.  As part of our continuing mission to provide you with exceptional heart care, we have created designated Provider Care Teams.  These Care Teams include your primary Cardiologist (physician) and Advanced Practice Providers (APPs -  Physician Assistants and Nurse Practitioners) who all work together to provide you with the care you need, when you need it.  We recommend signing up for the patient portal called "MyChart".  Sign up information is provided on this After Visit Summary.  MyChart is used to connect with patients for Virtual Visits (Telemedicine).  Patients are able to view lab/test results, encounter notes, upcoming appointments, etc.  Non-urgent messages can be sent to your provider as well.   To learn more about what you can do with MyChart, go to [email or web address].    Your next appointment:   Keep your appointment on 6/29 with Dr. Royann Shivers

## 2021-05-23 NOTE — Progress Notes (Signed)
Cardiology Office Note:    Date:  05/23/2021   ID:  Brenda Lloyd, DOB December 23, 1951, MRN [id number]  PCP:  [name], MD  Cardiologist:  [name], MD   Referring MD: [name], MD   Chief Complaint  Patient presents with  . Follow-up    Post cath.    History of Present Illness:    Brenda Lloyd is a 70 y.o. 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  Cardiac catheterization performed at that time showed an incidental finding of a 65% stenosis in the distal LAD artery.  Her ejection fraction was as low as 25-35%.  Follow-up echocardiogram performed on November 12, 2017 showed complete recovery, with a left ventricular ejection fraction of 55-60%.   She returns with complaints of chest discomfort duration reminiscent of her episode of presumed stress cardiomyopathy years ago.  ECG showed minor anterior wall T wave inversion.  She was started on treatment with carvedilol, which improved her episodes of chest discomfort.  Coronary angiography was performed and showed no evidence of coronary stenoses, although the distal LAD artery "tapered".  Note that at the time of cardiac catheterization the LVEDP was borderline high at 18 mmHg.  She has done quite well since her cardiac catheterization and has not had any cardiac symptoms.  However, her ECG today shows very severe and striking repolarization abnormalities with deep symmetrical anterior wall T wave inversion.  The QTC is normal at 451 ms.  She is not experiencing any chest discomfort and denies dizziness, dyspnea, palpitations, syncope or any other cardiac complaints.  She is surprised at how low her blood pressure is but has not had any symptoms related to this.     Past Medical History:  Diagnosis Date  . Hypertension     Past Surgical History:  Procedure Laterality Date  . LEFT HEART CATH AND CORONARY ANGIOGRAPHY N/A 09/05/2017    Procedure: LEFT HEART CATH AND CORONARY ANGIOGRAPHY;  Surgeon: [name], MD;  Location: [facility] INVASIVE CV LAB;  Service: Cardiovascular;  Laterality: N/A;  . LEFT HEART CATH AND CORONARY ANGIOGRAPHY N/A 05/08/2021   Procedure: LEFT HEART CATH AND CORONARY ANGIOGRAPHY;  Surgeon: [name], MD;  Location: [facility] INVASIVE CV LAB;  Service: Cardiovascular;  Laterality: N/A;    Current Medications: Current Meds  Medication Sig  . acetaminophen (TYLENOL) 500 MG tablet Take 1,000 mg by mouth every 6 (six) hours as needed (pain).  Marland Kitchen amLODipine (NORVASC) 2.5 MG tablet Take 1 tablet (2.5 mg total) by mouth daily.  Marland Kitchen aspirin EC 81 MG tablet Take 81 mg by mouth daily.  Marland Kitchen atorvastatin (LIPITOR) 40 MG tablet TAKE 1 TABLET(40 MG) BY MOUTH DAILY (Patient taking differently: Take 40 mg by mouth daily.)  . carvedilol (COREG) 3.125 MG tablet Take 1 tablet (3.125 mg total) by mouth 2 (two) times daily with a meal.  . nitroGLYCERIN (NITROSTAT) 0.4 MG SL tablet Place 1 tablet (0.4 mg total) under the tongue every 5 (five) minutes as needed for chest pain.  . [DISCONTINUED] valsartan (DIOVAN) 160 MG tablet TAKE 1 TABLET BY MOUTH EVERY DAY (Patient taking differently: Take 160 mg by mouth daily.)   Current Facility-Administered Medications for the 05/23/21 encounter (Office Visit) with [name], MD  Medication  . sodium chloride flush (NS) 0.9 % injection 3 mL     Allergies:   Amoxicillin, Ciprofloxacin, Fluocinolone, Penicillins, and Sulfa antibiotics   Social History  Socioeconomic History  . Marital status: Married    Spouse name: Not on file  . Number of children: Not on file  . Years of education: Not on file  . Highest education level: Not on file  Occupational History  . Not on file  Tobacco Use  . Smoking status: Never Smoker  . Smokeless tobacco: Never Used  Substance and Sexual Activity  . Alcohol use: Yes    Comment: occ  . Drug use: No  . Sexual activity: Not on file  Other  Topics Concern  . Not on file  Social History Narrative  . Not on file   Social Determinants of Health   Financial Resource Strain: Not on file  Food Insecurity: Not on file  Transportation Needs: Not on file  Physical Activity: Not on file  Stress: Not on file  Social Connections: Not on file     Family History: The patient's family history is significantly negative for premature coronary artery disease ROS:   Please see the history of present illness.     All other systems reviewed and are negative.  EKGs/Labs/Other Studies Reviewed:    The following studies were reviewed today: Echocardiogram from November 2018  EKG:  EKG is ordered today.  Shows normal sinus rhythm/mild sinus bradycardia with very prominent deep symmetrical T wave inversion in the anterior leads as well as the inferior leads.  QTC borderline at 451 ms. Recent Labs: 05/02/2021: BUN 15; Creatinine, Ser 0.69; Hemoglobin 12.8; Platelets 321; Potassium 4.5; Sodium 141  Recent Lipid Panel    Component Value Date/Time   CHOL 144 03/16/2019 0956   TRIG 129 03/16/2019 0956   HDL 44 03/16/2019 0956   CHOLHDL 3.3 03/16/2019 0956   CHOLHDL 5.5 09/04/2017 2105   VLDL 20 09/04/2017 2105   LDLCALC 74 03/16/2019 0956    Physical Exam:    VS:  BP 102/70 (BP Location: Left Arm, Patient Position: Sitting, Cuff Size: Normal)   Pulse (!) 54   Ht 5\' 9"  (1.753 m)   Wt 183 lb (83 kg)   BMI 27.02 kg/m     Wt Readings from Last 3 Encounters:  05/23/21 183 lb (83 kg)  05/08/21 176 lb (79.8 kg)  05/02/21 183 lb (83 kg)     General: Alert, oriented x3, no distress, very mildly overweight Head: no evidence of trauma, PERRL, EOMI, no exophtalmos or lid lag, no myxedema, no xanthelasma; normal ears, nose and oropharynx Neck: normal jugular venous pulsations and no hepatojugular reflux; brisk carotid pulses without delay and no carotid bruits Chest: clear to auscultation, no signs of consolidation by percussion or  palpation, normal fremitus, symmetrical and full respiratory excursions Cardiovascular: normal position and quality of the apical impulse, regular rhythm, normal first and second heart sounds, no murmurs, rubs or gallops Abdomen: no tenderness or distention, no masses by palpation, no abnormal pulsatility or arterial bruits, normal bowel sounds, no hepatosplenomegaly Extremities: no clubbing, cyanosis or edema; 2+ radial, ulnar and brachial pulses bilaterally; 2+ right femoral, posterior tibial and dorsalis pedis pulses; 2+ left femoral, posterior tibial and dorsalis pedis pulses; no subclavian or femoral bruits Neurological: grossly nonfocal Psych: Normal mood and affect   ASSESSMENT:    1. Coronary artery disease involving native coronary artery of native heart with other form of angina pectoris (HCC)   2. Essential hypertension   3. Hypercholesterolemia   4. Takotsubo syndrome    PLAN:    In order of problems listed above:  1. CAD: She  presented again with complaints suggesting angina pectoris.  However coronary angiography did not show any meaningful coronary stenosis.  Her ECG shows very striking dynamic changes suggestive of recovering Takotsubo syndrome or postischemic stunning.  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  She does not smoke or have Raynaud's syndrome.  Start amlodipine.  Carvedilol preferred to selective beta-blockers.  We will bring her back in a month to see evolution of ECG. 2. HTN: Well-controlled on carvedilol and valsartan, will use amlodipine instead of valsartan for empirical treatment of coronary vasospasm. 3. HLP: Most recent lipid profile showed all parameters at or very close to target range. 4. Takotsubo sd.:  echocardiography showed full recovery of LVEF.  Currently does not have any signs or symptoms of heart failure whatsoever.   Medication Adjustments/Labs and Tests Ordered: Current medicines  are reviewed at length with the patient today.  Concerns regarding medicines are outlined above.  Orders Placed This Encounter  Procedures  . EKG 12-Lead   Meds ordered this encounter  Medications  . amLODipine (NORVASC) 2.5 MG tablet    Sig: Take 1 tablet (2.5 mg total) by mouth daily.    Dispense:  90 tablet    Refill:  1   Patient Instructions  Medication Instructions:  STOP the Valsartan  START Amlodipine 2.5 mg once daily  *If you need a refill on your cardiac medications before your next appointment, please call your pharmacy*   Lab Work: None ordered If you have labs (blood work) drawn today and your tests are completely normal, you will receive your results only by: Marland Kitchen MyChart Message (if you have MyChart) OR . A paper copy in the mail If you have any lab test that is abnormal or we need to change your treatment, we will call you to review the results.   Testing/Procedures: None ordered   Follow-Up: At [facility], you and your health needs are our priority.  As part of our continuing mission to provide you with exceptional heart care, we have created designated Provider Care Teams.  These Care Teams include your primary Cardiologist (physician) and Advanced Practice Providers (APPs -  Physician Assistants and Nurse Practitioners) who all work together to provide you with the care you need, when you need it.  We recommend signing up for the patient portal called "MyChart".  Sign up information is provided on this After Visit Summary.  MyChart is used to connect with patients for Virtual Visits (Telemedicine).  Patients are able to view lab/test results, encounter notes, upcoming appointments, etc.  Non-urgent messages can be sent to your provider as well.   To learn more about what you can do with MyChart, go to [email or web address].    Your next appointment:   Keep your appointment on 6/29 with Dr. Royann Shivers    Signed, [name], MD  05/23/2021 12:28 PM     [facility]

## 2021-06-25 ENCOUNTER — Ambulatory Visit: Payer: Medicare PPO | Admitting: Cardiovascular Disease

## 2021-06-25 ENCOUNTER — Other Ambulatory Visit: Payer: Self-pay

## 2021-06-25 ENCOUNTER — Encounter: Payer: Self-pay | Admitting: Cardiovascular Disease

## 2021-06-25 VITALS — BP 146/82 | HR 63 | Ht 69.0 in | Wt 186.6 lb

## 2021-06-25 DIAGNOSIS — I5181 Takotsubo syndrome: Secondary | ICD-10-CM | POA: Diagnosis not present

## 2021-06-25 DIAGNOSIS — I201 Angina pectoris with documented spasm: Secondary | ICD-10-CM

## 2021-06-25 DIAGNOSIS — I10 Essential (primary) hypertension: Secondary | ICD-10-CM | POA: Diagnosis not present

## 2021-06-25 DIAGNOSIS — E7800 Pure hypercholesterolemia, unspecified: Secondary | ICD-10-CM

## 2021-06-25 NOTE — Progress Notes (Signed)
Cardiology Office Note:    Date:  06/25/2021   ID:  RENDI MAPEL, DOB [birth year], MRN [id number]  PCP:  [name], MD  Cardiologist:  [name], MD   Referring MD: [name], MD   Chief Complaint  Patient presents with   coronary vasospasm     History of Present Illness:    Brenda Lloyd is a 70 y.o. 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  Cardiac catheterization performed at that time showed an incidental finding of a 65% stenosis in the distal LAD artery.  Her ejection fraction was as low as 25-35%.  Follow-up echocardiogram performed on November 12, 2017 showed complete recovery, with a left ventricular ejection fraction of 55-60%.   She returned for follow-up earlier this year with vague complaints of chest discomfort and her ECG shows striking T wave inversion across the anterior precordium with very deep symmetrical T wave inversion.  Repeat coronary angiography performed 12/16/2021 showed no evidence of atherosclerotic CAD, just a tapering distal LAD artery without significant focal stenosis.  Left ventricular regional wall motion, overall EF and filling pressures were all normal.  BP is a little high today, but at home is typically in the 120s/70s.  She feels well and has not had any episodes of lingering chest discomfort.  She occasionally has a little "up-and-down spell" which is very brief and I think is her way of describing palpitations.  She has not had any dizziness, syncope or dyspnea.  She denies leg edema since starting the amlodipine.  She does not have claudication or Raynaud's syndrome and denies any neurological complaints.   Past Medical History:  Diagnosis Date   Hypertension     Past Surgical History:  Procedure Laterality Date   LEFT HEART CATH AND CORONARY ANGIOGRAPHY N/A 09/05/2017   Procedure: LEFT HEART CATH AND CORONARY ANGIOGRAPHY;  Surgeon:  [name], MD;  Location: [facility] [place] INVASIVE CV LAB;  Service: Cardiovascular;  Laterality: N/A;   LEFT HEART CATH AND CORONARY ANGIOGRAPHY N/A 05/08/2021   Procedure: LEFT HEART CATH AND CORONARY ANGIOGRAPHY;  Surgeon: [name], MD;  Location: [facility] INVASIVE CV LAB;  Service: Cardiovascular;  Laterality: N/A;    Current Medications: Current Meds  Medication Sig   acetaminophen (TYLENOL) 500 MG tablet Take 1,000 mg by mouth every 6 (six) hours as needed (pain).   amLODipine (NORVASC) 2.5 MG tablet Take 1 tablet (2.5 mg total) by mouth daily.   aspirin EC 81 MG tablet Take 81 mg by mouth daily.   atorvastatin (LIPITOR) 40 MG tablet TAKE 1 TABLET(40 MG) BY MOUTH DAILY   carvedilol (COREG) 3.125 MG tablet Take 1 tablet (3.125 mg total) by mouth 2 (two) times daily with a meal.   nitroGLYCERIN (NITROSTAT) 0.4 MG SL tablet Place 1 tablet (0.4 mg total) under the tongue every 5 (five) minutes as needed for chest pain.   Current Facility-Administered Medications for the 06/25/21 encounter (Office Visit) with Catalena Stanhope, Rachelle Hora, MD  Medication   sodium chloride flush (NS) 0.9 % injection 3 mL     Allergies:   Amoxicillin, Ciprofloxacin, Fluocinolone, Penicillins, and Sulfa antibiotics   Social History   Socioeconomic History   Marital status: Married    Spouse name: Not on file   Number of children: Not on file   Years of education: Not on file   Highest education level: Not on file  Occupational History   Not on file  Tobacco Use   Smoking status: Never   Smokeless tobacco: Never  Substance and Sexual Activity   Alcohol use: Yes    Comment: occ   Drug use: No   Sexual activity: Not on file  Other Topics Concern   Not on file  Social History Narrative   Not on file   Social Determinants of Health   Financial Resource Strain: Not on file  Food Insecurity: Not on file  Transportation Needs: Not on file  Physical Activity: Not on file  Stress: Not on file  Social Connections:  Not on file     Family History: The patient's family history is significantly negative for premature coronary artery disease ROS:   Please see the history of present illness.     All other systems reviewed and are negative.  EKGs/Labs/Other Studies Reviewed:    The following studies were reviewed today: Cardiac catheterization 05/08/2021 Conclusions: No angiographically significant coronary artery disease.  Distal LAD tapers to a small vessel as it nears the apex without focal stenosis. Normal left ventricular contraction with mildly elevated filling pressure.   Recommendations: Continue medical therapy, including addition of recently added carvedilol.  If symptoms persist, gentle diuresis may also be helpful in the setting of elevated left ventricular filling pressure and preserved LVEF.   EKG:  EKG is ordered today.  It is very similar with the previous tracing, showing sinus bradycardia and marked T wave inversion from V2 to V6 as well as in 1, 2 and aVL.  QTc is normal at 436 ms.  Recent Labs: 05/02/2021: BUN 15; Creatinine, Ser 0.69; Hemoglobin 12.8; Platelets 321; Potassium 4.5; Sodium 141  Recent Lipid Panel    Component Value Date/Time   CHOL 144 03/16/2019 0956   TRIG 129 03/16/2019 0956   HDL 44 03/16/2019 0956   CHOLHDL 3.3 03/16/2019 0956   CHOLHDL 5.5 09/04/2017 2105   VLDL 20 09/04/2017 2105   LDLCALC 74 03/16/2019 0956    Physical Exam:    VS:  BP (!) 146/82   Pulse 63   Ht 5\' 9"  (1.753 m)   Wt 186 lb 9.6 oz (84.6 kg)   SpO2 99%   BMI 27.56 kg/m     Wt Readings from Last 3 Encounters:  06/25/21 186 lb 9.6 oz (84.6 kg)  05/23/21 183 lb (83 kg)  05/08/21 176 lb (79.8 kg)      General: Alert, oriented x3, no distress, mildly overweight Head: no evidence of trauma, PERRL, EOMI, no exophtalmos or lid lag, no myxedema, no xanthelasma; normal ears, nose and oropharynx Neck: normal jugular venous pulsations and no hepatojugular reflux; brisk carotid pulses  without delay and no carotid bruits Chest: clear to auscultation, no signs of consolidation by percussion or palpation, normal fremitus, symmetrical and full respiratory excursions Cardiovascular: normal position and quality of the apical impulse, regular rhythm, normal first and second heart sounds, no murmurs, rubs or gallops Abdomen: no tenderness or distention, no masses by palpation, no abnormal pulsatility or arterial bruits, normal bowel sounds, no hepatosplenomegaly Extremities: no clubbing, cyanosis or edema; 2+ radial, ulnar and brachial pulses bilaterally; 2+ right femoral, posterior tibial and dorsalis pedis pulses; 2+ left femoral, posterior tibial and dorsalis pedis pulses; no subclavian or femoral bruits Neurological: grossly nonfocal Psych: Normal mood and affect    ASSESSMENT:    1. Coronary vasospasm (HCC)   2. Essential hypertension   3. Hypercholesterolemia   4. Takotsubo syndrome     PLAN:    In order  of problems listed above:  Coronary vasospasm: Symptoms suggestive of angina pectoris associated with striking dynamic changes in the precordial leads, despite the absence of coronary artery stenoses.  Strong case for coronary vasospasm in the LAD.  On carvedilol and amlodipine and atorvastatin.  I am a little surprised about the persistence of the changes on the ECG, but they were definitely not there in June 2021 and the changes were very subtle on 05/03/19/2022, becoming more dramatic on 05/23/2021, making it unlikely that they are a reflection of a cardiomyopathy.  We will bring her back for another electrocardiogram in a couple of months and if the changes are persistent we can discuss whether or not she needs a cardiac MRI. HTN: Prefer use of combined alpha-beta-blockers and calcium channel blockers in case she truly has coronary vasospasm.  Consider adding long-acting nitrates if necessary. HLP: Lipid profile shows satisfactory parameters. History of Takotsubo sd.:   Echocardiogram in the past and recent LV angiogram showed complete recovery of wall motion abnormalities and overall EF.  No signs or symptoms of heart failure.   Medication Adjustments/Labs and Tests Ordered: Current medicines are reviewed at length with the patient today.  Concerns regarding medicines are outlined above.  Orders Placed This Encounter  Procedures   EKG 12-Lead    No orders of the defined types were placed in this encounter.  Patient Instructions  Medication Instructions:  No changes *If you need a refill on your cardiac medications before your next appointment, please call your pharmacy*   Lab Work: None ordered If you have labs (blood work) drawn today and your tests are completely normal, you will receive your results only by: MyChart Message (if you have MyChart) OR A paper copy in the mail If you have any lab test that is abnormal or we need to change your treatment, we will call you to review the results.   Testing/Procedures: None ordered   Follow-Up: At [facility], you and your health needs are our priority.  As part of our continuing mission to provide you with exceptional heart care, we have created designated Provider Care Teams.  These Care Teams include your primary Cardiologist (physician) and Advanced Practice Providers (APPs -  Physician Assistants and Nurse Practitioners) who all work together to provide you with the care you need, when you need it.  We recommend signing up for the patient portal called "MyChart".  Sign up information is provided on this After Visit Summary.  MyChart is used to connect with patients for Virtual Visits (Telemedicine).  Patients are able to view lab/test results, encounter notes, upcoming appointments, etc.  Non-urgent messages can be sent to your provider as well.   To learn more about what you can do with MyChart, go to [email or web address].    Your next appointment:   Follow up in August with Azalee Course,  PA Follow up in 6 months with Dr. Royann Shivers  Signed, [name], MD  06/25/2021 9:00 AM    [facility]

## 2021-06-25 NOTE — Patient Instructions (Signed)
Medication Instructions:  No changes *If you need a refill on your cardiac medications before your next appointment, please call your pharmacy*   Lab Work: None ordered If you have labs (blood work) drawn today and your tests are completely normal, you will receive your results only by: MyChart Message (if you have MyChart) OR A paper copy in the mail If you have any lab test that is abnormal or we need to change your treatment, we will call you to review the results.   Testing/Procedures: None ordered   Follow-Up: At [facility], you and your health needs are our priority.  As part of our continuing mission to provide you with exceptional heart care, we have created designated Provider Care Teams.  These Care Teams include your primary Cardiologist (physician) and Advanced Practice Providers (APPs -  Physician Assistants and Nurse Practitioners) who all work together to provide you with the care you need, when you need it.  We recommend signing up for the patient portal called "MyChart".  Sign up information is provided on this After Visit Summary.  MyChart is used to connect with patients for Virtual Visits (Telemedicine).  Patients are able to view lab/test results, encounter notes, upcoming appointments, etc.  Non-urgent messages can be sent to your provider as well.   To learn more about what you can do with MyChart, go to [email or web address].    Your next appointment:   Follow up in August with Brenda Course, PA Follow up in 6 months with Dr. Royann Lloyd

## 2021-06-26 NOTE — Progress Notes (Signed)
I called to notify Brenda Lloyd that unfortunately may have inadvertently exposure to COVID-19.  Although I had a negative test yesterday morning before we met in the office and was asymptomatic, I did test positive for the illness today.  I advised her to be aware of any symptoms of viral illness such as fever, sore throat, dyspnea, cough, loss of taste/smell sensation, etc. and to promptly test for COVID-19 if these should occur.  If she does test positive I asked her to please call me or her primary care provider to discuss possible treatments if necessary.

## 2021-08-04 ENCOUNTER — Other Ambulatory Visit: Payer: Self-pay | Admitting: Cardiovascular Disease

## 2021-08-25 ENCOUNTER — Ambulatory Visit: Payer: Medicare PPO | Admitting: Physician Assistant

## 2021-08-25 ENCOUNTER — Encounter: Payer: Self-pay | Admitting: Physician Assistant

## 2021-08-25 ENCOUNTER — Other Ambulatory Visit: Payer: Self-pay

## 2021-08-25 VITALS — BP 140/88 | HR 66 | Resp 20 | Ht 69.0 in | Wt 180.4 lb

## 2021-08-25 DIAGNOSIS — E785 Hyperlipidemia, unspecified: Secondary | ICD-10-CM

## 2021-08-25 DIAGNOSIS — I10 Essential (primary) hypertension: Secondary | ICD-10-CM | POA: Diagnosis not present

## 2021-08-25 DIAGNOSIS — I201 Angina pectoris with documented spasm: Secondary | ICD-10-CM

## 2021-08-25 DIAGNOSIS — R9431 Abnormal electrocardiogram [ECG] [EKG]: Secondary | ICD-10-CM

## 2021-08-25 MED ORDER — AMLODIPINE BESYLATE 5 MG PO TABS: 5.0000 mg | ORAL_TABLET | Freq: Every day | ORAL | 1 refills | Status: DC

## 2021-08-25 NOTE — Patient Instructions (Addendum)
Medication Instructions:   INCREASE Amalodipine to 5 mg tablet by mouth ONCE daily;  Okay to take two of your 2.5 mg tablets daily until completed and then start new dose of 1 tablet at next refill  *If you need a refill on your cardiac medications before your next appointment, please call your pharmacy*   Lab Work: None  If you have labs (blood work) drawn today and your tests are completely normal, you will receive your results only by: MyChart Message (if you have MyChart) OR A paper copy in the mail If you have any lab test that is abnormal or we need to change your treatment, we will call you to review the results.   Testing/Procedures: none   Follow-Up: At [facility], you and your health needs are our priority.  As part of our continuing mission to provide you with exceptional heart care, we have created designated Provider Care Teams.  These Care Teams include your primary Cardiologist (physician) and Advanced Practice Providers (APPs -  Physician Assistants and Nurse Practitioners) who all work together to provide you with the care you need, when you need it.  We recommend signing up for the patient portal called "MyChart".  Sign up information is provided on this After Visit Summary.  MyChart is used to connect with patients for Virtual Visits (Telemedicine).  Patients are able to view lab/test results, encounter notes, upcoming appointments, etc.  Non-urgent messages can be sent to your provider as well.   To learn more about what you can do with MyChart, go to [email or web address].    Your next appointment:   Keep scheduled follow up with Dr. Royann Shivers in December  December 02, 2021 at [facility]    The format for your next appointment:   In Person  Provider:   [name], MD   Other Instructions none

## 2021-08-25 NOTE — Progress Notes (Signed)
Cardiology Office Note:    Date:  08/27/2021   ID:  Brenda Lloyd, DOB June 15, 1951, MRN [id number]  PCP:  [name], MD   [facility] Providers Cardiologist:  [name], MD     Referring MD: [name], MD   Chief Complaint  Patient presents with   Follow-up    Seen for Dr. Royann Shivers    History of Present Illness:    Brenda Lloyd is a 70 y.o. 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  Cardiac catheterization at the time showed a 65% lesion from mid to distal LAD, otherwise no significant blockage to explain her low EF.  Her ejection fraction at the time was 25 to 35%.  Follow-up echocardiogram in November 2018 shows complete recovery with EF improved to 55 to 60%.  I last saw the patient in May 2022 with recurrent chest discomfort with exertion.  Cardiac catheterization showed no angiographically significant coronary artery disease on 05/08/2021, there is tapering in the distal LAD artery without significant focal stenosis, LVEDP was elevated at 21 mmHg.   She was last seen by Dr. Royann Shivers on 06/25/2021 at which time her blood pressure was a little high.  It was suspected her symptoms he may 2022 may be related to coronary vasospasm in the LAD territory based on the EKG changes.  Patient presents today for follow-up.  She has not had any significant chest discomfort since the last visit.  Her EKG completely normalized.  I reviewed it with Dr. Royann Shivers, only explanation is coronary vasospasm at this point.  Her blood pressure is borderline elevated at home, I will increase her amlodipine to 5 mg daily.  Otherwise she can follow-up with Dr. Royann Shivers as previously scheduled.  Past Medical History:  Diagnosis Date   Hypertension     Past Surgical History:  Procedure Laterality Date   LEFT HEART CATH AND CORONARY ANGIOGRAPHY N/A 09/05/2017   Procedure: LEFT  HEART CATH AND CORONARY ANGIOGRAPHY;  Surgeon: [name], MD;  Location: [facility] INVASIVE CV LAB;  Service: Cardiovascular;  Laterality: N/A;   LEFT HEART CATH AND CORONARY ANGIOGRAPHY N/A 05/08/2021   Procedure: LEFT HEART CATH AND CORONARY ANGIOGRAPHY;  Surgeon: [name], MD;  Location: [facility] INVASIVE CV LAB;  Service: Cardiovascular;  Laterality: N/A;    Current Medications: Current Meds  Medication Sig   acetaminophen (TYLENOL) 500 MG tablet Take 1,000 mg by mouth every 6 (six) hours as needed (pain).   aspirin EC 81 MG tablet Take 81 mg by mouth daily.   atorvastatin (LIPITOR) 40 MG tablet TAKE 1 TABLET(40 MG) BY MOUTH DAILY   carvedilol (COREG) 3.125 MG tablet Take 1 tablet (3.125 mg total) by mouth 2 (two) times daily with a meal.   nitroGLYCERIN (NITROSTAT) 0.4 MG SL tablet Place 1 tablet (0.4 mg total) under the tongue every 5 (five) minutes as needed for chest pain.   Current Facility-Administered Medications for the 08/25/21 encounter (Office Visit) with Azalee Course, PA  Medication   sodium chloride flush (NS) 0.9 % injection 3 mL     Allergies:   Amoxicillin, Ciprofloxacin, Fluocinolone, Penicillins, and Sulfa antibiotics   Social History   Socioeconomic History   Marital status: Married    Spouse name: Not on file   Number of children: Not on file   Years of education: Not on file   Highest education level: Not on file  Occupational History  Not on file  Tobacco Use   Smoking status: Never   Smokeless tobacco: Never  Substance and Sexual Activity   Alcohol use: Yes    Comment: occ   Drug use: No   Sexual activity: Not on file  Other Topics Concern   Not on file  Social History Narrative   Not on file   Social Determinants of Health   Financial Resource Strain: Not on file  Food Insecurity: Not on file  Transportation Needs: Not on file  Physical Activity: Not on file  Stress: Not on file  Social Connections: Not on file     Family History: The  patient's family history is not on file.  ROS:   Please see the history of present illness.     All other systems reviewed and are negative.  EKGs/Labs/Other Studies Reviewed:    The following studies were reviewed today:  Echo 11/12/2017 LV EF: 55% -   60%  Study Conclusions   - Left ventricle: The cavity size was normal. There was mild    concentric hypertrophy. Systolic function was normal. The    estimated ejection fraction was in the range of 55% to 60%. Wall    motion was normal; there were no regional wall motion    abnormalities. Doppler parameters are consistent with abnormal    left ventricular relaxation (grade 1 diastolic dysfunction).  - Aortic valve: Trileaflet; mildly thickened, mildly calcified    leaflets.   Impressions:   - EF has returned to normal.    Cath 05/08/2021 Conclusions: No angiographically significant coronary artery disease.  Distal LAD tapers to a small vessel as it nears the apex without focal stenosis. Normal left ventricular contraction with mildly elevated filling pressure.   Recommendations: Continue medical therapy, including addition of recently added carvedilol.  If symptoms persist, gentle diuresis may also be helpful in the setting of elevated left ventricular filling pressure and preserved LVEF.   EKG:  EKG is ordered today.  The ekg ordered today demonstrates NSR with HR 56, previous TWI in the anterior leads resolved.   Recent Labs: 05/02/2021: BUN 15; Creatinine, Ser 0.69; Hemoglobin 12.8; Platelets 321; Potassium 4.5; Sodium 141  Recent Lipid Panel    Component Value Date/Time   CHOL 144 03/16/2019 0956   TRIG 129 03/16/2019 0956   HDL 44 03/16/2019 0956   CHOLHDL 3.3 03/16/2019 0956   CHOLHDL 5.5 09/04/2017 2105   VLDL 20 09/04/2017 2105   LDLCALC 74 03/16/2019 0956     Risk Assessment/Calculations:           Physical Exam:    VS:  BP 140/88   Pulse 66   Resp 20   Ht 5\' 9"  (1.753 m)   Wt 180 lb 6.4 oz (81.8  kg)   SpO2 97%   BMI 26.64 kg/m     Wt Readings from Last 3 Encounters:  08/25/21 180 lb 6.4 oz (81.8 kg)  06/25/21 186 lb 9.6 oz (84.6 kg)  05/23/21 183 lb (83 kg)     GEN:  Well nourished, well developed in no acute distress HEENT: Normal NECK: No JVD; No carotid bruits LYMPHATICS: No lymphadenopathy CARDIAC: RRR, no murmurs, rubs, gallops RESPIRATORY:  Clear to auscultation without rales, wheezing or rhonchi  ABDOMEN: Soft, non-tender, non-distended MUSCULOSKELETAL:  No edema; No deformity  SKIN: Warm and dry NEUROLOGIC:  Alert and oriented x 3 PSYCHIATRIC:  Normal affect   ASSESSMENT:    1. Coronary vasospasm (HCC)   2. Essential  hypertension   3. Hyperlipidemia LDL goal <70    PLAN:    In order of problems listed above:  Coronary Vasospasm: Previous cardiac catheterization did not reveal significant coronary artery disease.  However since cardiac catheterization patient had persistent anterior T wave inversion.  It is suspected she had coronary vasospasm.  Repeat EKG showed resolution of the T wave inversion.  No further work-up is needed at this time.  Continue on amlodipine  Hypertension: Blood pressure well controlled  Hyperlipidemia: On Lipitor        Medication Adjustments/Labs and Tests Ordered: Current medicines are reviewed at length with the patient today.  Concerns regarding medicines are outlined above.  Orders Placed This Encounter  Procedures   EKG 12-Lead   Meds ordered this encounter  Medications   amLODipine (NORVASC) 5 MG tablet    Sig: Take 1 tablet (5 mg total) by mouth daily.    Dispense:  90 tablet    Refill:  1    Patient Instructions  Medication Instructions:   INCREASE Amalodipine to 5 mg tablet by mouth ONCE daily;  Okay to take two of your 2.5 mg tablets daily until completed and then start new dose of 1 tablet at next refill  *If you need a refill on your cardiac medications before your next appointment, please call your  pharmacy*   Lab Work: None  If you have labs (blood work) drawn today and your tests are completely normal, you will receive your results only by: MyChart Message (if you have MyChart) OR A paper copy in the mail If you have any lab test that is abnormal or we need to change your treatment, we will call you to review the results.   Testing/Procedures: none   Follow-Up: At [facility], you and your health needs are our priority.  As part of our continuing mission to provide you with exceptional heart care, we have created designated Provider Care Teams.  These Care Teams include your primary Cardiologist (physician) and Advanced Practice Providers (APPs -  Physician Assistants and Nurse Practitioners) who all work together to provide you with the care you need, when you need it.  We recommend signing up for the patient portal called "MyChart".  Sign up information is provided on this After Visit Summary.  MyChart is used to connect with patients for Virtual Visits (Telemedicine).  Patients are able to view lab/test results, encounter notes, upcoming appointments, etc.  Non-urgent messages can be sent to your provider as well.   To learn more about what you can do with MyChart, go to [email or web address].    Your next appointment:   Keep scheduled follow up with Dr. Royann Shivers in December  December 02, 2021 at [facility]    The format for your next appointment:   In Person  Provider:   [name], MD   Other Instructions none   Signed, Azalee Course, [place]  08/27/2021 7:56 PM    [facility]

## 2021-08-27 ENCOUNTER — Encounter: Payer: Self-pay | Admitting: Physician Assistant

## 2021-09-05 ENCOUNTER — Other Ambulatory Visit: Payer: Self-pay | Admitting: Cardiovascular Disease

## 2021-11-24 ENCOUNTER — Other Ambulatory Visit: Payer: Self-pay | Admitting: Physician Assistant

## 2021-12-02 ENCOUNTER — Encounter: Payer: Self-pay | Admitting: Cardiovascular Disease

## 2021-12-02 ENCOUNTER — Other Ambulatory Visit: Payer: Self-pay

## 2021-12-02 ENCOUNTER — Ambulatory Visit (INDEPENDENT_AMBULATORY_CARE_PROVIDER_SITE_OTHER): Payer: Medicare PPO | Admitting: Cardiovascular Disease

## 2021-12-02 VITALS — BP 120/70 | HR 64 | Ht 69.0 in | Wt 184.4 lb

## 2021-12-02 DIAGNOSIS — E785 Hyperlipidemia, unspecified: Secondary | ICD-10-CM | POA: Diagnosis not present

## 2021-12-02 DIAGNOSIS — I201 Angina pectoris with documented spasm: Secondary | ICD-10-CM | POA: Diagnosis not present

## 2021-12-02 DIAGNOSIS — I5181 Takotsubo syndrome: Secondary | ICD-10-CM | POA: Diagnosis not present

## 2021-12-02 DIAGNOSIS — I10 Essential (primary) hypertension: Secondary | ICD-10-CM | POA: Diagnosis not present

## 2021-12-02 NOTE — Progress Notes (Signed)
Cardiology Office Note:    Date:  12/02/2021   ID:  Brenda Lloyd, DOB 1951-07-20, MRN [id number]  PCP:  [name], MD  Cardiologist:  [name], MD   Referring MD: [name], MD   No chief complaint on file.    History of Present Illness:    Brenda Lloyd is a 70 y.o. 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  Cardiac catheterization performed at that time showed an incidental finding of a 65% stenosis in the distal LAD artery.  Her ejection fraction was as low as 25-35%.  Follow-up echocardiogram performed on November 12, 2017 showed complete recovery, with a left ventricular ejection fraction of 55-60%.   She returned for follow-up earlier this year with vague complaints of chest discomfort and her ECG showed striking T wave inversion across the anterior precordium with very deep symmetrical T wave inversion.  Repeat coronary angiography performed 05/08/2021 showed no evidence of atherosclerotic CAD, just a tapering distal LAD artery without significant focal stenosis.  Left ventricular regional wall motion, overall EF and filling pressures were all normal. ECG changes normalized on follow up in August 2022. Amlodipine was added and increased for presumed coronary vasospasm.  She is active: gardening, raking leaves, digging in the yard. The patient specifically denies any chest pain at rest or with exertion, dyspnea at rest or with exertion, orthopnea, paroxysmal nocturnal dyspnea, syncope, palpitations, focal neurological deficits, intermittent claudication, lower extremity edema, unexplained weight gain, cough, hemoptysis or wheezing.  No symptoms of Raynaud's syndrome.  Past Medical History:  Diagnosis Date   Hypertension     Past Surgical History:  Procedure Laterality Date   LEFT HEART CATH AND CORONARY ANGIOGRAPHY N/A 09/05/2017   Procedure: LEFT HEART CATH AND CORONARY  ANGIOGRAPHY;  Surgeon: [name], MD;  Location: [facility] INVASIVE CV LAB;  Service: Cardiovascular;  Laterality: N/A;   LEFT HEART CATH AND CORONARY ANGIOGRAPHY N/A 05/08/2021   Procedure: LEFT HEART CATH AND CORONARY ANGIOGRAPHY;  Surgeon: [name], MD;  Location: [facility] INVASIVE CV LAB;  Service: Cardiovascular;  Laterality: N/A;    Current Medications: Current Meds  Medication Sig   acetaminophen (TYLENOL) 500 MG tablet Take 1,000 mg by mouth every 6 (six) hours as needed (pain).   aspirin EC 81 MG tablet Take 81 mg by mouth daily.   atorvastatin (LIPITOR) 40 MG tablet TAKE 1 TABLET(40 MG) BY MOUTH DAILY   carvedilol (COREG) 3.125 MG tablet TAKE 1 TABLET(3.125 MG) BY MOUTH TWICE DAILY WITH A MEAL   nitroGLYCERIN (NITROSTAT) 0.4 MG SL tablet Place 1 tablet (0.4 mg total) under the tongue every 5 (five) minutes as needed for chest pain.   valsartan (DIOVAN) 160 MG tablet TAKE 1 TABLET BY MOUTH EVERY DAY   Current Facility-Administered Medications for the 12/02/21 encounter (Office Visit) with Deryn Massengale, Rachelle Hora, MD  Medication   sodium chloride flush (NS) 0.9 % injection 3 mL     Allergies:   Amoxicillin, Ciprofloxacin, Fluocinolone, Penicillins, and Sulfa antibiotics   Social History   Socioeconomic History   Marital status: Married    Spouse name: Not on file   Number of children: Not on file   Years of education: Not on file   Highest education level: Not on file  Occupational History   Not on file  Tobacco Use   Smoking status: Never   Smokeless tobacco: Never  Substance and Sexual Activity   Alcohol use: Yes  Comment: occ   Drug use: No   Sexual activity: Not on file  Other Topics Concern   Not on file  Social History Narrative   Not on file   Social Determinants of Health   Financial Resource Strain: Not on file  Food Insecurity: Not on file  Transportation Needs: Not on file  Physical Activity: Not on file  Stress: Not on file  Social Connections: Not on  file     Family History: The patient's family history is significantly negative for premature coronary artery disease ROS:   Please see the history of present illness.     All other systems reviewed and are negative.  EKGs/Labs/Other Studies Reviewed:    The following studies were reviewed today: Cardiac catheterization 05/08/2021 Conclusions: No angiographically significant coronary artery disease.  Distal LAD tapers to a small vessel as it nears the apex without focal stenosis. Normal left ventricular contraction with mildly elevated filling pressure.   Recommendations: Continue medical therapy, including addition of recently added carvedilol.  If symptoms persist, gentle diuresis may also be helpful in the setting of elevated left ventricular filling pressure and preserved LVEF.   EKG:  EKG is ordered today.  It is very similar with the previous tracing, showing sinus bradycardia and marked T wave inversion from V2 to V6 as well as in 1, 2 and aVL.  QTc is normal at 436 ms.  Recent Labs: 05/02/2021: BUN 15; Creatinine, Ser 0.69; Hemoglobin 12.8; Platelets 321; Potassium 4.5; Sodium 141  Recent Lipid Panel    Component Value Date/Time   CHOL 144 03/16/2019 0956   TRIG 129 03/16/2019 0956   HDL 44 03/16/2019 0956   CHOLHDL 3.3 03/16/2019 0956   CHOLHDL 5.5 09/04/2017 2105   VLDL 20 09/04/2017 2105   LDLCALC 74 03/16/2019 0956    Physical Exam:    VS:  BP 120/70 (BP Location: Left Arm)   Pulse 64   Ht 5\' 9"  (1.753 m)   Wt 184 lb 6.4 oz (83.6 kg)   SpO2 96%   BMI 27.23 kg/m     Wt Readings from Last 3 Encounters:  12/02/21 184 lb 6.4 oz (83.6 kg)  08/25/21 180 lb 6.4 oz (81.8 kg)  06/25/21 186 lb 9.6 oz (84.6 kg)     General: Alert, oriented x3, no distress Head: no evidence of trauma, PERRL, EOMI, no exophtalmos or lid lag, no myxedema, no xanthelasma; normal ears, nose and oropharynx Neck: normal jugular venous pulsations and no hepatojugular reflux; brisk  carotid pulses without delay and no carotid bruits Chest: clear to auscultation, no signs of consolidation by percussion or palpation, normal fremitus, symmetrical and full respiratory excursions Cardiovascular: normal position and quality of the apical impulse, regular rhythm, normal first and second heart sounds, no murmurs, rubs or gallops Abdomen: no tenderness or distention, no masses by palpation, no abnormal pulsatility or arterial bruits, normal bowel sounds, no hepatosplenomegaly Extremities: no clubbing, cyanosis or edema; 2+ radial, ulnar and brachial pulses bilaterally; 2+ right femoral, posterior tibial and dorsalis pedis pulses; 2+ left femoral, posterior tibial and dorsalis pedis pulses; no subclavian or femoral bruits Neurological: grossly nonfocal Psych: Normal mood and affect     ASSESSMENT:    1. Vasospastic angina (HCC)   2. Essential hypertension   3. Hyperlipidemia LDL goal <70   4. Takotsubo syndrome      PLAN:    In order of problems listed above:  Coronary vasospasm: Clinical presentation (including the events that occurred in September  2018 that we initially thought was stress cardiomyopathy) strongly suggestive of coronary vasospasm.  ECG changes have come and gone without an obvious trigger.  She is on carvedilol, amlodipine, statin and aspirin.  She has not required any nitroglycerin since her last appointment.  HTN: Well-controlled with calcium channel blocker and combined alpha-beta-blocker.  Can add nitrates if needed.  On ARB for theoretical endothelial dysfunction advantage. HLP: Plan to repeat her lipid profile.  She ate today, including foods high in triglycerides.  We will check later this month. History of Takotsubo sd.  Versus ischemic myocardial stunning from vasospasm:  Echocardiogram in the past and recent LV angiogram showed complete recovery of wall motion abnormalities and overall EF.  No signs or symptoms of heart failure.   Medication  Adjustments/Labs and Tests Ordered: Current medicines are reviewed at length with the patient today.  Concerns regarding medicines are outlined above.  Orders Placed This Encounter  Procedures   Lipid panel     No orders of the defined types were placed in this encounter.  Patient Instructions  Medication Instructions:  No changes *If you need a refill on your cardiac medications before your next appointment, please call your pharmacy*   Lab Work: Your provider would like for you to return in the next few weeks to have the following labs drawn: fasting Lipid. You do not need an appointment for the lab. Once in our office lobby there is a podium where you can sign in and ring the doorbell to alert [place] that you are here. The lab is open from 8:00 am to 4:30 pm; closed for lunch from 12:45pm-1:45pm.  If you have labs (blood work) drawn today and your tests are completely normal, you will receive your results only by: MyChart Message (if you have MyChart) OR A paper copy in the mail If you have any lab test that is abnormal or we need to change your treatment, we will call you to review the results.   Testing/Procedures: None ordered   Follow-Up: At [facility], you and your health needs are our priority.  As part of our continuing mission to provide you with exceptional heart care, we have created designated Provider Care Teams.  These Care Teams include your primary Cardiologist (physician) and Advanced Practice Providers (APPs -  Physician Assistants and Nurse Practitioners) who all work together to provide you with the care you need, when you need it.  We recommend signing up for the patient portal called "MyChart".  Sign up information is provided on this After Visit Summary.  MyChart is used to connect with patients for Virtual Visits (Telemedicine).  Patients are able to view lab/test results, encounter notes, upcoming appointments, etc.  Non-urgent messages can be sent to your  provider as well.   To learn more about what you can do with MyChart, go to [email or web address].    Your next appointment:   6 month(s)  The format for your next appointment:   In Person  Provider:   [name], MD     Signed, [name], MD  12/02/2021 9:26 AM    [facility]

## 2021-12-02 NOTE — Patient Instructions (Signed)
Medication Instructions:  No changes *If you need a refill on your cardiac medications before your next appointment, please call your pharmacy*   Lab Work: Your provider would like for you to return in the next few weeks to have the following labs drawn: fasting Lipid. You do not need an appointment for the lab. Once in our office lobby there is a podium where you can sign in and ring the doorbell to alert [place] that you are here. The lab is open from 8:00 am to 4:30 pm; closed for lunch from 12:45pm-1:45pm.  If you have labs (blood work) drawn today and your tests are completely normal, you will receive your results only by: MyChart Message (if you have MyChart) OR A paper copy in the mail If you have any lab test that is abnormal or we need to change your treatment, we will call you to review the results.   Testing/Procedures: None ordered   Follow-Up: At [facility], you and your health needs are our priority.  As part of our continuing mission to provide you with exceptional heart care, we have created designated Provider Care Teams.  These Care Teams include your primary Cardiologist (physician) and Advanced Practice Providers (APPs -  Physician Assistants and Nurse Practitioners) who all work together to provide you with the care you need, when you need it.  We recommend signing up for the patient portal called "MyChart".  Sign up information is provided on this After Visit Summary.  MyChart is used to connect with patients for Virtual Visits (Telemedicine).  Patients are able to view lab/test results, encounter notes, upcoming appointments, etc.  Non-urgent messages can be sent to your provider as well.   To learn more about what you can do with MyChart, go to [email or web address].    Your next appointment:   6 month(s)  The format for your next appointment:   In Person  Provider:   [name], MD

## 2021-12-17 ENCOUNTER — Other Ambulatory Visit: Payer: Self-pay

## 2021-12-17 DIAGNOSIS — I5181 Takotsubo syndrome: Secondary | ICD-10-CM

## 2021-12-17 DIAGNOSIS — I2510 Atherosclerotic heart disease of native coronary artery without angina pectoris: Secondary | ICD-10-CM

## 2021-12-17 MED ORDER — ATORVASTATIN CALCIUM 40 MG PO TABS: ORAL_TABLET | ORAL | 1 refills | Status: DC

## 2021-12-30 ENCOUNTER — Other Ambulatory Visit: Payer: Self-pay

## 2021-12-30 DIAGNOSIS — I2510 Atherosclerotic heart disease of native coronary artery without angina pectoris: Secondary | ICD-10-CM

## 2021-12-30 DIAGNOSIS — I5181 Takotsubo syndrome: Secondary | ICD-10-CM

## 2021-12-30 MED ORDER — ATORVASTATIN CALCIUM 40 MG PO TABS: ORAL_TABLET | ORAL | 1 refills | Status: DC

## 2022-02-24 ENCOUNTER — Other Ambulatory Visit: Payer: Self-pay | Admitting: Physician Assistant

## 2022-03-30 ENCOUNTER — Telehealth: Payer: Self-pay | Admitting: Cardiovascular Disease

## 2022-03-30 DIAGNOSIS — I2510 Atherosclerotic heart disease of native coronary artery without angina pectoris: Secondary | ICD-10-CM

## 2022-03-30 DIAGNOSIS — I5181 Takotsubo syndrome: Secondary | ICD-10-CM

## 2022-03-30 MED ORDER — ATORVASTATIN CALCIUM 40 MG PO TABS: ORAL_TABLET | ORAL | 1 refills | Status: DC

## 2022-03-30 NOTE — Telephone Encounter (Signed)
Refill for Atorvastatin sent to [facility]. ?

## 2022-03-30 NOTE — Telephone Encounter (Signed)
?*  STAT* If patient is at the pharmacy, call can be transferred to refill team. ? ? ?1. Which medications need to be refilled? (please list name of each medication and dose if known) atorvastatin (LIPITOR) 40 MG tablet ? ?2. Which pharmacy/location (including street and city if local pharmacy) is medication to be sent to? [address] [place] Phone: [phone] ? ?3. Do they need a 30 day or 90 day supply? 90 day ? ? ?Patient is completely out of medication. She states she has changed pharmacies and would like this updated in her chart.  ? ? ?

## 2022-04-03 ENCOUNTER — Other Ambulatory Visit: Payer: Self-pay | Admitting: Cardiovascular Disease

## 2022-05-29 ENCOUNTER — Other Ambulatory Visit: Payer: Self-pay | Admitting: Cardiovascular Disease

## 2022-06-04 ENCOUNTER — Encounter: Payer: Self-pay | Admitting: Physician Assistant

## 2022-06-04 ENCOUNTER — Other Ambulatory Visit: Payer: Self-pay

## 2022-06-04 ENCOUNTER — Ambulatory Visit (INDEPENDENT_AMBULATORY_CARE_PROVIDER_SITE_OTHER): Payer: Medicare PPO | Admitting: Physician Assistant

## 2022-06-04 VITALS — BP 122/80 | HR 58 | Ht 69.0 in | Wt 183.8 lb

## 2022-06-04 DIAGNOSIS — R570 Cardiogenic shock: Secondary | ICD-10-CM

## 2022-06-04 DIAGNOSIS — I255 Ischemic cardiomyopathy: Secondary | ICD-10-CM

## 2022-06-04 DIAGNOSIS — I201 Angina pectoris with documented spasm: Secondary | ICD-10-CM | POA: Diagnosis not present

## 2022-06-04 DIAGNOSIS — E785 Hyperlipidemia, unspecified: Secondary | ICD-10-CM

## 2022-06-04 DIAGNOSIS — I5181 Takotsubo syndrome: Secondary | ICD-10-CM

## 2022-06-04 DIAGNOSIS — I10 Essential (primary) hypertension: Secondary | ICD-10-CM

## 2022-06-04 DIAGNOSIS — I2510 Atherosclerotic heart disease of native coronary artery without angina pectoris: Secondary | ICD-10-CM

## 2022-06-04 LAB — COMPREHENSIVE METABOLIC PANEL
ALT: 26 IU/L (ref 0–32)
AST: 18 IU/L (ref 0–40)
Albumin/Globulin Ratio: 1.6 (ref 1.2–2.2)
Albumin: 4.6 g/dL (ref 3.8–4.8)
Alkaline Phosphatase: 115 IU/L (ref 44–121)
BUN/Creatinine Ratio: 14 (ref 12–28)
BUN: 9 mg/dL (ref 8–27)
Bilirubin Total: 0.9 mg/dL (ref 0.0–1.2)
CO2: 28 mmol/L (ref 20–29)
Calcium: 10.3 mg/dL (ref 8.7–10.3)
Chloride: 105 mmol/L (ref 96–106)
Creatinine, Ser: 0.65 mg/dL (ref 0.57–1.00)
Globulin, Total: 2.8 g/dL (ref 1.5–4.5)
Glucose: 119 mg/dL — ABNORMAL HIGH (ref 70–99)
Potassium: 4.7 mmol/L (ref 3.5–5.2)
Sodium: 144 mmol/L (ref 134–144)
Total Protein: 7.4 g/dL (ref 6.0–8.5)
eGFR: 95 mL/min/{1.73_m2} (ref >59)

## 2022-06-04 LAB — LIPID PANEL
Chol/HDL Ratio: 3.2 ratio (ref 0.0–4.4)
Cholesterol, Total: 138 mg/dL (ref 100–199)
HDL: 43 mg/dL (ref >39)
LDL Chol Calc (NIH): 75 mg/dL (ref 0–99)
Triglycerides: 111 mg/dL (ref 0–149)
VLDL Cholesterol Cal: 20 mg/dL (ref 5–40)

## 2022-06-04 MED ORDER — CARVEDILOL 3.125 MG PO TABS: 3.1250 mg | ORAL_TABLET | Freq: Two times a day (BID) | ORAL | 3 refills | Status: DC

## 2022-06-04 MED ORDER — AMLODIPINE BESYLATE 5 MG PO TABS: 5.0000 mg | ORAL_TABLET | Freq: Every day | ORAL | 3 refills | Status: DC

## 2022-06-04 MED ORDER — ATORVASTATIN CALCIUM 40 MG PO TABS: ORAL_TABLET | ORAL | 1 refills | Status: DC

## 2022-06-04 MED ORDER — NITROGLYCERIN 0.4 MG SL SUBL: 0.4000 mg | SUBLINGUAL_TABLET | SUBLINGUAL | 2 refills | Status: DC | PRN

## 2022-06-04 NOTE — Progress Notes (Unsigned)
Cardiology Office Note:    Date:  06/04/2022   ID:  Brenda Lloyd, DOB May 07, 1951, MRN [id number]  PCP:  [name], MD   [facility] Providers Cardiologist:  [name], MD { Click to update primary MD,subspecialty MD or APP then REFRESH:1}    Referring MD: [name], MD   No chief complaint on file. ***  History of Present Illness:    Brenda Lloyd is a 70 y.o. 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  Cardiac catheterization at the time showed a 65% lesion from mid to distal LAD, otherwise no significant blockage to explain her low EF.  Her ejection fraction at the time was 25 to 35%.  Follow-up echocardiogram in November 2018 shows complete recovery with EF improved to 55 to 60%.  Cardiac catheterization in May 2022 showed no angiographically significant coronary artery disease on 05/08/2021, there is tapering in the distal LAD artery without significant focal stenosis, LVEDP was elevated at 21 mmHg.   She was last seen by Dr. Royann Shivers in June 2022 at which time it was suspected her symptom prior to cardiac catheterization was more related to coronary vasospasm in the LAD territory based on EKG changes.  I last saw the patient in August 2022 at which time she was doing well.  She was last seen by Dr. Royann Shivers in December 2022 at which time she was quite active without any symptoms.  Patient presents today for 99-month follow-up.  She denies any recent chest pain and worsening shortness of breath.  She has no anginal symptom.  She remains quite active without exertional symptoms.  She may follow-up in 6 months.  She has no heart failure symptoms either.  Past Medical History:  Diagnosis Date   Hypertension     Past Surgical History:  Procedure Laterality Date   LEFT HEART CATH AND CORONARY ANGIOGRAPHY N/A 09/05/2017   Procedure: LEFT HEART CATH AND  CORONARY ANGIOGRAPHY;  Surgeon: [name], MD;  Location: [facility] INVASIVE CV LAB;  Service: Cardiovascular;  Laterality: N/A;   LEFT HEART CATH AND CORONARY ANGIOGRAPHY N/A 05/08/2021   Procedure: LEFT HEART CATH AND CORONARY ANGIOGRAPHY;  Surgeon: [name], MD;  Location: [facility] INVASIVE CV LAB;  Service: Cardiovascular;  Laterality: N/A;    Current Medications: Current Meds  Medication Sig   acetaminophen (TYLENOL) 500 MG tablet Take 1,000 mg by mouth every 6 (six) hours as needed (pain).   amLODipine (NORVASC) 5 MG tablet TAKE 1 TABLET (5 MG TOTAL) BY MOUTH DAILY.   aspirin EC 81 MG tablet Take 81 mg by mouth daily.   atorvastatin (LIPITOR) 40 MG tablet TAKE 1 TABLET(40 MG) BY MOUTH DAILY   carvedilol (COREG) 3.125 MG tablet TAKE 1 TABLET BY MOUTH TWICE A DAY WITH MEALS   nitroGLYCERIN (NITROSTAT) 0.4 MG SL tablet Place 1 tablet (0.4 mg total) under the tongue every 5 (five) minutes as needed for chest pain.   Current Facility-Administered Medications for the 06/04/22 encounter (Office Visit) with Azalee Course, PA  Medication   sodium chloride flush (NS) 0.9 % injection 3 mL     Allergies:   Amoxicillin, Ciprofloxacin, Fluocinolone, Penicillins, and Sulfa antibiotics   Social History   Socioeconomic History   Marital status: Married    Spouse name: Not on file   Number of children: Not on file   Years of education: Not on file   Highest education level: Not  on file  Occupational History   Not on file  Tobacco Use   Smoking status: Never   Smokeless tobacco: Never  Substance and Sexual Activity   Alcohol use: Yes    Comment: occ   Drug use: No   Sexual activity: Not on file  Other Topics Concern   Not on file  Social History Narrative   Not on file   Social Determinants of Health   Financial Resource Strain: Not on file  Food Insecurity: Not on file  Transportation Needs: Not on file  Physical Activity: Not on file  Stress: Not on file  Social Connections: Not  on file     Family History: The patient's ***family history is not on file.  ROS:   Please see the history of present illness.    *** All other systems reviewed and are negative.  EKGs/Labs/Other Studies Reviewed:    The following studies were reviewed today: ***  EKG:  EKG is *** ordered today.  The ekg ordered today demonstrates ***  Recent Labs: No results found for requested labs within last 365 days.  Recent Lipid Panel    Component Value Date/Time   CHOL 144 03/16/2019 0956   TRIG 129 03/16/2019 0956   HDL 44 03/16/2019 0956   CHOLHDL 3.3 03/16/2019 0956   CHOLHDL 5.5 09/04/2017 2105   VLDL 20 09/04/2017 2105   LDLCALC 74 03/16/2019 0956     Risk Assessment/Calculations:   {Does this patient have ATRIAL FIBRILLATION?[phone]}       Physical Exam:    VS:  BP 122/80   Pulse (!) 58   Ht 5\' 9"  (1.753 m)   Wt 183 lb 12.8 oz (83.4 kg)   SpO2 98%   BMI 27.14 kg/m     Wt Readings from Last 3 Encounters:  06/04/22 183 lb 12.8 oz (83.4 kg)  12/02/21 184 lb 6.4 oz (83.6 kg)  08/25/21 180 lb 6.4 oz (81.8 kg)     GEN: *** Well nourished, well developed in no acute distress HEENT: Normal NECK: No JVD; No carotid bruits LYMPHATICS: No lymphadenopathy CARDIAC: ***RRR, no murmurs, rubs, gallops RESPIRATORY:  Clear to auscultation without rales, wheezing or rhonchi  ABDOMEN: Soft, non-tender, non-distended MUSCULOSKELETAL:  No edema; No deformity  SKIN: Warm and dry NEUROLOGIC:  Alert and oriented x 3 PSYCHIATRIC:  Normal affect   ASSESSMENT:    No diagnosis found. PLAN:    In order of problems listed above:  ***      {Are you ordering a CV Procedure (e.g. stress test, cath, DCCV, TEE, etc)?   Press F2        :08/27/21    Medication Adjustments/Labs and Tests Ordered: Current medicines are reviewed at length with the patient today.  Concerns regarding medicines are outlined above.  No orders of the defined types were placed in this  encounter.  No orders of the defined types were placed in this encounter.   There are no Patient Instructions on file for this visit.   [id number]}, Ramond Dial  06/04/2022 10:20 AM    [facility]

## 2022-06-04 NOTE — Patient Instructions (Signed)
Medication Instructions:  Your physician recommends that you continue on your current medications as directed. Please refer to the Current Medication list given to you today.  *If you need a refill on your cardiac medications before your next appointment, please call your pharmacy*  Lab Work: Your physician recommends that you return for lab work TODAY:  CMP FLP  If you have labs (blood work) drawn today and your tests are completely normal, you will receive your results only by: MyChart Message (if you have MyChart) OR A paper copy in the mail If you have any lab test that is abnormal or we need to change your treatment, we will call you to review the results.  Testing/Procedures: NONE ordered at this time of appointment   Follow-Up: At [facility], you and your health needs are our priority.  As part of our continuing mission to provide you with exceptional heart care, we have created designated Provider Care Teams.  These Care Teams include your primary Cardiologist (physician) and Advanced Practice Providers (APPs -  Physician Assistants and Nurse Practitioners) who all work together to provide you with the care you need, when you need it.  We recommend signing up for the patient portal called "MyChart".  Sign up information is provided on this After Visit Summary.  MyChart is used to connect with patients for Virtual Visits (Telemedicine).  Patients are able to view lab/test results, encounter notes, upcoming appointments, etc.  Non-urgent messages can be sent to your provider as well.   To learn more about what you can do with MyChart, go to [email or web address].    Your next appointment:   6 month(s)  The format for your next appointment:   In Person  Provider:   [name], MD     Other Instructions   Important Information About Sugar

## 2022-06-06 ENCOUNTER — Encounter: Payer: Self-pay | Admitting: Physician Assistant

## 2022-07-01 ENCOUNTER — Telehealth: Payer: Self-pay | Admitting: Cardiovascular Disease

## 2022-07-01 NOTE — Telephone Encounter (Signed)
Patient got her lab results back and her fasting blood glucose was high. She does not have a PCP and is not sure what to do

## 2022-07-01 NOTE — Telephone Encounter (Signed)
Spoke to patient lab results given.Advised glucose elevated.Stated she does not have a PCP.She wanted to know if Dr.Croitoru would manage.Advised she needs a PCP.Advised to eat a better diet.She will call today a get established with a PCP.I will make Dr.Croitoru aware.

## 2022-08-18 ENCOUNTER — Telehealth: Payer: Self-pay | Admitting: *Deleted

## 2022-08-18 NOTE — Telephone Encounter (Signed)
   Pt is returning call, she said, she will pick up the paperwork early on Thursday. Also, pt requested if Misty Stanley can show her which section she needs to fill out

## 2022-08-18 NOTE — Telephone Encounter (Signed)
Paperwork left up front with yellow tabs where the patient needs to fill out themselves.

## 2022-08-18 NOTE — Telephone Encounter (Signed)
Left a message for the patient to call back. The paperwork has been filled out by Dr. Royann Shivers. There is still a section that needs to be filled out by the patient. We need to know if they will come pick up the paperwork or mail it to them.

## 2022-08-20 ENCOUNTER — Telehealth: Payer: Self-pay | Admitting: Cardiovascular Disease

## 2022-08-20 NOTE — Telephone Encounter (Signed)
I'm glad she is better. If she does take Paxlovid, not only should she hold atorvastatin, but also reduce the amlodipine dose in half for duration of treatment w Paxlovid + another 3 days.

## 2022-08-20 NOTE — Telephone Encounter (Signed)
Patient's husband states the patient has COVID and has been vomiting, sore throat, extremely thirsty, and coughing. She also hasn't eaten any food in the past 24 hours. Patient's husband would like to know if Dr. Royann Shivers can prescribe something for nausea. He would also like to [facility] if she needs to continue taking her cardiac medications being that she hasn't eaten and can not keep anything down. Please advise.

## 2022-08-20 NOTE — Telephone Encounter (Signed)
Would recommend patient contact PCP for Paxlovid or other antiviral treatment since her illness sounds severe.  PCP would also be able to prescribe Zofran which does not have any interactions with her current medications.  If she did start Paxlovid, would have patient hold atorvastatin until treatment is complete.

## 2022-08-20 NOTE — Telephone Encounter (Signed)
Called patient, spoke with husband- he states that patient became sick yesterday. She has COVID- and has been coughing, vomiting nonstop since yesterday, and has a sore throat- only this morning has she been able to keep down water, she has not ate anything. Patient husband would like to know if Dr.C can send over nausea medications - I did ask if they had notified PCP office, but husband states he would like for Dr.C to review and send in- so nothing interacts with her heart medications, I advised I would send a message over to ask, and would update them.   Patient husband also concerned that since patient has not ate, some of the medications she takes say "take with food" she is not able to keep anything down and would like to know if she should take these medications since she is not able to eat anything yet.   Patient states she is feeling some better today, and husband agrees however only thing has kept down was water this morning.   I notified I would send to MD/PHARMD to review.   Husband verbalized understanding.

## 2022-08-20 NOTE — Telephone Encounter (Signed)
Called patient husband back- they will contact PCP office for Zofran, however they are not sure they want to try Paxlovid at this time.    Patient states his wife is feeling better- has not vomited in the last hour, they are going to try to give some food and take morning medications.   They are aware to call back with any further concerns

## 2022-08-21 ENCOUNTER — Telehealth: Payer: Self-pay | Admitting: Cardiovascular Disease

## 2022-08-21 NOTE — Telephone Encounter (Signed)
Ms. Vantol's husband is requesting that we mail [employer] paperwork to his home address.

## 2022-08-21 NOTE — Telephone Encounter (Signed)
The patient's husband stated that she is feeling much better and will not being taking the Paxlovid.

## 2022-12-04 ENCOUNTER — Ambulatory Visit: Payer: Medicare PPO | Admitting: Cardiovascular Disease

## 2022-12-08 ENCOUNTER — Ambulatory Visit: Payer: Medicare PPO | Attending: Cardiovascular Disease | Admitting: Cardiovascular Disease

## 2022-12-08 ENCOUNTER — Encounter: Payer: Self-pay | Admitting: Cardiovascular Disease

## 2022-12-08 VITALS — BP 138/80 | HR 60 | Ht 69.0 in | Wt 174.6 lb

## 2022-12-08 DIAGNOSIS — I10 Essential (primary) hypertension: Secondary | ICD-10-CM | POA: Diagnosis not present

## 2022-12-08 DIAGNOSIS — E7800 Pure hypercholesterolemia, unspecified: Secondary | ICD-10-CM

## 2022-12-08 DIAGNOSIS — I201 Angina pectoris with documented spasm: Secondary | ICD-10-CM | POA: Diagnosis not present

## 2022-12-08 NOTE — Progress Notes (Unsigned)
Cardiology Office Note:    Date:  12/09/2022   ID:  Brenda Lloyd, DOB [birth year], MRN [id number]  PCP:  [name], MD  Cardiologist:  [name], MD   Referring MD: [name], MD   Chief Complaint  Patient presents with   Coronary Artery Disease      History of Present Illness:    Brenda Lloyd is a 71 y.o. 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  Cardiac catheterization performed at that time showed an incidental finding of a 65% stenosis in the distal LAD artery.  Her ejection fraction was as low as 25-35%.  Follow-up echocardiogram performed on November 12, 2017 showed complete recovery, with a left ventricular ejection fraction of 55-60%.   She returned for follow-up May 2022 with vague complaints of chest discomfort and her ECG showed striking T wave inversion across the anterior precordium with very deep symmetrical T wave inversion.  Repeat coronary angiography performed 05/08/2021 showed no evidence of atherosclerotic CAD, just a tapering distal LAD artery without significant focal stenosis.  Left ventricular regional wall motion, overall EF and filling pressures were all normal. ECG changes normalized on follow up in August 2022. Amlodipine was added and increased for presumed coronary vasospasm.  Staying physically active and has no complaints of chest pain at rest or with activity.  She has not had dyspnea at rest or with activity.  Since starting amlodipine she has been free of any of the episodes of vague chest discomfort.  She does not have ankle swelling.  She denies palpitations, dizziness or syncope.  On treatment with atorvastatin her LDL is close to target at 75.  She has not had any focal neurological events or intermittent claudication.  Past Medical History:  Diagnosis Date   Hypertension     Past Surgical History:  Procedure Laterality Date   LEFT HEART CATH  AND CORONARY ANGIOGRAPHY N/A 09/05/2017   Procedure: LEFT HEART CATH AND CORONARY ANGIOGRAPHY;  Surgeon: [name], MD;  [facility]: [facility] INVASIVE CV LAB;  Service: Cardiovascular;  Laterality: N/A;   LEFT HEART CATH AND CORONARY ANGIOGRAPHY N/A 05/08/2021   Procedure: LEFT HEART CATH AND CORONARY ANGIOGRAPHY;  Surgeon: [name], MD;  Location: [facility] INVASIVE CV LAB;  Service: Cardiovascular;  Laterality: N/A;    Current Medications: Current Meds  Medication Sig   acetaminophen (TYLENOL) 500 MG tablet Take 1,000 mg by mouth every 6 (six) hours as needed (pain).   amLODipine (NORVASC) 5 MG tablet Take 1 tablet (5 mg total) by mouth daily.   aspirin EC 81 MG tablet Take 81 mg by mouth daily.   atorvastatin (LIPITOR) 40 MG tablet TAKE 1 TABLET(40 MG) BY MOUTH DAILY   carvedilol (COREG) 3.125 MG tablet Take 1 tablet (3.125 mg total) by mouth 2 (two) times daily with a meal.   nitroGLYCERIN (NITROSTAT) 0.4 MG SL tablet Place 1 tablet (0.4 mg total) under the tongue every 5 (five) minutes as needed for chest pain.   Current Facility-Administered Medications for the 12/08/22 encounter (Office Visit) with Amaliya Whitelaw, Rachelle Hora, MD  Medication   sodium chloride flush (NS) 0.9 % injection 3 mL     Allergies:   Amoxicillin, Ciprofloxacin, Fluocinolone, Penicillins, and Sulfa antibiotics   Social History   Socioeconomic History   Marital status: Married    Spouse name: Not on file   Number of children: Not on file   Years of education: Not on  file   Highest education level: Not on file  Occupational History   Not on file  Tobacco Use   Smoking status: Never   Smokeless tobacco: Never  Substance and Sexual Activity   Alcohol use: Yes    Comment: occ   Drug use: No   Sexual activity: Not on file  Other Topics Concern   Not on file  Social History Narrative   Not on file   Social Determinants of Health   Financial Resource Strain: Not on file  Food Insecurity: Not on file   Transportation Needs: Not on file  Physical Activity: Not on file  Stress: Not on file  Social Connections: Not on file     Family History: The patient's family history is significantly negative for premature coronary artery disease ROS:   Please see the history of present illness.     All other systems reviewed and are negative.  EKGs/Labs/Other Studies Reviewed:    The following studies were reviewed today: Cardiac catheterization 05/08/2021 Conclusions: No angiographically significant coronary artery disease.  Distal LAD tapers to a small vessel as it nears the apex without focal stenosis. Normal left ventricular contraction with mildly elevated filling pressure.   Recommendations: Continue medical therapy, including addition of recently added carvedilol.  If symptoms persist, gentle diuresis may also be helpful in the setting of elevated left ventricular filling pressure and preserved LVEF.   EKG:  EKG is ordered today.  It shows normal sinus rhythm and is completely normal, complete resolution of the previous repolarization abnormalities..  Recent Labs: 06/04/2022: ALT 26; BUN 9; Creatinine, Ser 0.65; Potassium 4.7; Sodium 144  Recent Lipid Panel    Component Value Date/Time   CHOL 138 06/04/2022 1059   TRIG 111 06/04/2022 1059   HDL 43 06/04/2022 1059   CHOLHDL 3.2 06/04/2022 1059   CHOLHDL 5.5 09/04/2017 2105   VLDL 20 09/04/2017 2105   LDLCALC 75 06/04/2022 1059    Physical Exam:    VS:  BP 138/80   Pulse 60   Ht 5\' 9"  (1.753 m)   Wt 79.2 kg   SpO2 97%   BMI 25.78 kg/m     Wt Readings from Last 3 Encounters:  12/08/22 79.2 kg  06/04/22 83.4 kg  12/02/21 83.6 kg      General: Alert, oriented x3, no distress, appears well Head: no evidence of trauma, PERRL, EOMI, no exophtalmos or lid lag, no myxedema, no xanthelasma; normal ears, nose and oropharynx Neck: normal jugular venous pulsations and no hepatojugular reflux; brisk carotid pulses without delay  and no carotid bruits Chest: clear to auscultation, no signs of consolidation by percussion or palpation, normal fremitus, symmetrical and full respiratory excursions Cardiovascular: normal position and quality of the apical impulse, regular rhythm, normal first and second heart sounds, no murmurs, rubs or gallops Abdomen: no tenderness or distention, no masses by palpation, no abnormal pulsatility or arterial bruits, normal bowel sounds, no hepatosplenomegaly Extremities: no clubbing, cyanosis or edema; 2+ radial, ulnar and brachial pulses bilaterally; 2+ right femoral, posterior tibial and dorsalis pedis pulses; 2+ left femoral, posterior tibial and dorsalis pedis pulses; no subclavian or femoral bruits Neurological: grossly nonfocal Psych: Normal mood and affect      ASSESSMENT:    1. Vasospastic angina (HCC)   2. Essential hypertension   3. Hypercholesterolemia       PLAN:    In order of problems listed above:  Coronary vasospasm: Currently asymptomatic.  Clinical presentation (including the events that occurred  in September 2018 that we initially thought was stress cardiomyopathy) strongly suggestive of coronary vasospasm.  ECG changes have come and gone without an obvious trigger.  She is on carvedilol, amlodipine, statin and aspirin.  She has not required any nitroglycerin since her last appointment.  HTN: Well-controlled.  Avoid unopposed beta-blockers.  Continue with a vasodilator such as amlodipine. HLP: Lipid profile satisfactory, albeit not ideal. History of Takotsubo sd.  Versus ischemic myocardial stunning from vasospasm:  Echocardiogram in the past and recent LV angiogram showed complete recovery of wall motion abnormalities and overall EF.  No signs or symptoms of heart failure.   Medication Adjustments/Labs and Tests Ordered: Current medicines are reviewed at length with the patient today.  Concerns regarding medicines are outlined above.  Orders Placed This  Encounter  Procedures   EKG 12-Lead     No orders of the defined types were placed in this encounter.   Patient Instructions  Medication Instructions:  NO CHANGES  *If you need a refill on your cardiac medications before your next appointment, please call your pharmacy*    Follow-Up: At [facility], you and your health needs are our priority.  As part of our continuing mission to provide you with exceptional heart care, we have created designated Provider Care Teams.  These Care Teams include your primary Cardiologist (physician) and Advanced Practice Providers (APPs -  Physician Assistants and Nurse Practitioners) who all work together to provide you with the care you need, when you need it.  We recommend signing up for the patient portal called "MyChart".  Sign up information is provided on this After Visit Summary.  MyChart is used to connect with patients for Virtual Visits (Telemedicine).  Patients are able to view lab/test results, encounter notes, upcoming appointments, etc.  Non-urgent messages can be sent to your provider as well.   To learn more about what you can do with MyChart, go to [email or web address].    Your next appointment:   12 month(s)  The format for your next appointment:   In Person  Provider:   Sanda Klein, MD    Signed, Sanda Klein, MD  12/09/2022 7:39 PM    [place]

## 2022-12-08 NOTE — Patient Instructions (Signed)
Medication Instructions:  NO CHANGES  *If you need a refill on your cardiac medications before your next appointment, please call your pharmacy*  Follow-Up: At [facility], you and your health needs are our priority.  As part of our continuing mission to provide you with exceptional heart care, we have created designated Provider Care Teams.  These Care Teams include your primary Cardiologist (physician) and Advanced Practice Providers (APPs -  Physician Assistants and Nurse Practitioners) who all work together to provide you with the care you need, when you need it.  We recommend signing up for the patient portal called "MyChart".  Sign up information is provided on this After Visit Summary.  MyChart is used to connect with patients for Virtual Visits (Telemedicine).  Patients are able to view lab/test results, encounter notes, upcoming appointments, etc.  Non-urgent messages can be sent to your provider as well.   To learn more about what you can do with MyChart, go to [email or web address].    Your next appointment:   12 month(s)  The format for your next appointment:   In Person  Provider:   Mihai Croitoru, MD   

## 2022-12-09 ENCOUNTER — Encounter: Payer: Self-pay | Admitting: Cardiovascular Disease

## 2023-04-02 ENCOUNTER — Telehealth: Payer: Self-pay | Admitting: Cardiovascular Disease

## 2023-04-02 DIAGNOSIS — I5181 Takotsubo syndrome: Secondary | ICD-10-CM

## 2023-04-02 DIAGNOSIS — I2510 Atherosclerotic heart disease of native coronary artery without angina pectoris: Secondary | ICD-10-CM

## 2023-04-02 MED ORDER — ATORVASTATIN CALCIUM 40 MG PO TABS: ORAL_TABLET | ORAL | 2 refills | Status: DC

## 2023-04-02 NOTE — Telephone Encounter (Signed)
*  STAT* If patient is at the pharmacy, call can be transferred to refill team.   1. Which medications need to be refilled? (please list name of each medication and dose if known)   atorvastatin (LIPITOR) 40 MG tablet    2. Which pharmacy/location (including street and city if local pharmacy) is medication to be sent to?  [facility] - [place], [place] - [address]    3. Do they need a 30 day or 90 day supply? 90

## 2023-04-02 NOTE — Telephone Encounter (Signed)
Pt's medication was sent to pt's pharmacy as requested. Confirmation received.  °

## 2023-07-20 ENCOUNTER — Other Ambulatory Visit: Payer: Self-pay | Admitting: Cardiovascular Disease

## 2023-09-03 ENCOUNTER — Telehealth: Payer: Self-pay | Admitting: Cardiovascular Disease

## 2023-09-03 MED ORDER — AMLODIPINE BESYLATE 5 MG PO TABS: 5.0000 mg | ORAL_TABLET | Freq: Every day | ORAL | 0 refills | Status: DC

## 2023-09-03 NOTE — Telephone Encounter (Signed)
*  STAT* If patient is at the pharmacy, call can be transferred to refill team.   1. Which medications need to be refilled? (please list name of each medication and dose if known)   amLODipine (NORVASC) 5 MG tablet     2. Would you like to learn more about the convenience, safety, & potential cost savings by using the [facility]? No  3. Are you open to using the [facility] (Type [facility].) No   4. Which pharmacy/location (including street and city if local pharmacy) is medication to be sent to? [facility] - [place], [place] - [address]     5. Do they need a 30 day or 90 day supply? 90 day   Pt has scheudled appt on 12/16

## 2023-09-03 NOTE — Telephone Encounter (Signed)
Pt's medication was sent to pt's pharmacy as requested. Confirmation received.  °

## 2023-12-13 ENCOUNTER — Ambulatory Visit: Payer: Medicare PPO | Admitting: Cardiovascular Disease

## 2023-12-13 ENCOUNTER — Other Ambulatory Visit: Payer: Self-pay | Admitting: Cardiovascular Disease

## 2023-12-13 DIAGNOSIS — I5181 Takotsubo syndrome: Secondary | ICD-10-CM

## 2023-12-13 DIAGNOSIS — I2510 Atherosclerotic heart disease of native coronary artery without angina pectoris: Secondary | ICD-10-CM

## 2023-12-26 NOTE — Progress Notes (Unsigned)
Cardiology Office Note    Date:  12/27/2023  ID:  JORDY CARONIA, DOB 1951/03/30, MRN [id number] PCP:  [name], MD  Cardiologist:  [name], MD  Electrophysiologist:  None   Chief Complaint: One year follow up for coronary vasospasm   History of Present Illness: .    Brenda Lloyd is a 72 y.o. 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  She underwent cardiac catheterization at that time that indicated an incidental finding of a 65% stenosis in distal LAD.  Her ejection fraction was as low as 25 to 35%.  Her follow-up echocardiogram in November 2018 showed complete recovery with a left ventricular ejection fraction of 55 to 60%.  On a follow-up visit in May 2022 she had vague complaints of chest discomfort, her ECG showed striking T wave inversion across the anterior precordium with very deep symmetrical T wave inversion.  Heart catheterization on 05/08/2021 showed no evidence of atherosclerotic CAD, just a tapering distal LAD without significant focal stenosis.  Left ventricular regional wall motion, overall EF and filling pressures were all normal.  ECG changes normalized on follow-up in August 2022, amlodipine was added and increased for presumed coronary vasospasm.  She was last seen in clinic on 12/08/2022 by Dr. Royann Shivers.  She had remained stable from a cardiac perspective, since starting on amlodipine she had not had any episodes of vague chest discomfort.  Today she presents for follow-up.  She reports that she has been doing well.  She notes that she has been unable to exercise as she fell earlier in the year and hurt her leg at the grocery store after she slipped in the freezer section.  She denies chest pain, shortness of breath, lower extremity edema, palpitations, orthopnea or PND.  She reports that she is very healthy and is doing well.  ROS: .   Today she denies chest  pain, shortness of breath, lower extremity edema, fatigue, palpitations, melena, hematuria, hemoptysis, diaphoresis, weakness, presyncope, syncope, orthopnea, and PND.  All other systems are reviewed and otherwise negative. Studies Reviewed: Marland Kitchen    EKG:  EKG is ordered today, personally reviewed, demonstrating  EKG Interpretation Date/Time:  Monday December 27 2023 09:22:54 EST Ventricular Rate:  65 PR Interval:  190 QRS Duration:  86 QT Interval:  414 QTC Calculation: 430 R Axis:   -9  Text Interpretation: Normal sinus rhythm Normal ECG Confirmed by Reather Littler [phone]) on 12/27/2023 10:06:28 AM    CV Studies: Cardiac Studies & Procedures   CARDIAC CATHETERIZATION  CARDIAC CATHETERIZATION 05/08/2021  Narrative Conclusions: 1. No angiographically significant coronary artery disease.  Distal LAD tapers to a small vessel as it nears the apex without focal stenosis. 2. Normal left ventricular contraction with mildly elevated filling pressure.  Recommendations: 1. Continue medical therapy, including addition of recently added carvedilol.  If symptoms persist, gentle diuresis may also be helpful in the setting of elevated left ventricular filling pressure and preserved LVEF.  [name], MD [facility]  Findings Coronary Findings Diagnostic  Dominance: Right  Left Anterior Descending Vessel is large. Vessel is angiographically normal. The distal LAD tapers to a small vessel as it courses to the apex without focal stenosis.  First Diagonal Branch Vessel is large in size.  Left Circumflex Vessel is large. Vessel is angiographically normal.  First Obtuse Marginal Branch Vessel is small in size.  Second Obtuse Marginal Branch Vessel is moderate in size.  Third Obtuse Marginal Branch Vessel is moderate in size.  Right Coronary Artery Vessel is moderate in size. Vessel is angiographically normal.  Right Posterior Descending Artery Vessel is small in size.  Right  Posterior Atrioventricular Artery Vessel is small in size.  First Right Posterolateral Branch Vessel is small in size.  Second Right Posterolateral Branch Vessel is small in size.  Intervention  No interventions have been documented.   CARDIAC CATHETERIZATION  CARDIAC CATHETERIZATION 09/05/2017  Narrative Images from the original result were not included.   Dist LAD lesion, 65 %stenosed. Diffuse tapering from the mid vessel distally around the apex. The vessel has the appearance of potentially diffuse intramural thrombus.  There is moderate to severe left ventricular systolic dysfunction. The left ventricular ejection fraction is 25-35% by visual estimate. - In a Takotsubo pattern  LV end diastolic pressure is moderately elevated. 22 mmHg  There is moderate (3+) mitral regurgitation.  There is no aortic valve stenosis.  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 There is pruning of the septal branches and area that should be covered with with a diagonal branch is empty.  The patient was somewhat hypotensive due to sedation in the Cath Lab, but does not have the appearance of being a cardiac shock. Her blood pressures were mostly in the 110s. I did start low-dose Levophed to allow for sedation as she became agitated with the ventilator in place.  The patient was transferred back to the CCU for supportive care. ABG looked excellent. I suspect that she would benefit from at least one day of rest and potentially another dose of IV Lasix depending on her pressures.  As her pressure tolerate, we can wean off Levophed and potentially consider adding either amlodipine or Imdur for her definitive diffuse LAD disease.   Bryan Lemma, M.D., M.S. Interventional Cardiologist  Pager # [phone] Phone # [phone] [address] [place], [place] [address]  Findings Coronary Findings Diagnostic  Dominance: Right  Left Main Vessel is large. Vessel is angiographically normal.  Left Anterior Descending The lesion is spasm and smooth. Has appearance of either diffuse spasm or intramural thrombus.  First Diagonal Branch Vessel is angiographically normal.  Lateral First Diagonal Branch Vessel is small in size.  First Septal Branch Vessel is small in size.  Second Diagonal Branch Vessel is small in size.  Second Septal Branch Vessel is small in size. Small pruned-appearing  Third Septal Branch Vessel is small in size. Pruned appearing  Left Circumflex Vessel is angiographically normal.  First Obtuse Marginal Branch Vessel is angiographically normal.  Right Coronary Artery Vessel is large. Vessel is angiographically normal.  Acute Marginal Branch Vessel is small in size.  Right Posterior Descending Artery Vessel is moderate in size. Vessel is angiographically normal.  Inferior Septal Vessel is small in size.  Right Posterior Atrioventricular Artery Vessel is moderate in size. Vessel is angiographically normal.  First Right Posterolateral Branch Vessel is small in size. Vessel is angiographically normal.  Second Right Posterolateral Branch Vessel is small in size. Vessel is angiographically normal.  Third Right Posterolateral Branch Vessel is small in size. Vessel is angiographically normal.  Intervention  No interventions have been documented.    ECHOCARDIOGRAM  ECHOCARDIOGRAM COMPLETE 11/12/2017  Narrative *[facility]* [address] [place], [place] [address] [phone]  ------------------------------------------------------------------- Transthoracic Echocardiography  Patient:    Aitiana, Cleveringa  Shalva Sweigert MR #:       [id number] Study Date: 11/12/2017 Gender:     F Age:        11 Height:     175.3 cm Weight:     82.6 kg BSA:        2.02 m^2 Pt.  Status: Room:  ATTENDING    Carney Corners K REFERRING    Corine Shelter K SONOGRAPHER  Randa Evens, Will PERFORMING   [facility], Outpatient  cc:  ------------------------------------------------------------------- LV EF: 55% -   60%  ------------------------------------------------------------------- Indications:      (I51.81).  ------------------------------------------------------------------- History:   PMH:  Takotsubo syndrome. Acquired from the patient and from the patient&'s chart.  Coronary artery disease.  ------------------------------------------------------------------- Study Conclusions  - Left ventricle: The cavity size was normal. There was mild concentric hypertrophy. Systolic function was normal. The estimated ejection fraction was in the range of 55% to 60%. Wall motion was normal; there were no regional wall motion abnormalities. Doppler parameters are consistent with abnormal left ventricular relaxation (grade 1 diastolic dysfunction). - Aortic valve: Trileaflet; mildly thickened, mildly calcified leaflets.  Impressions:  - EF has returned to normal.  ------------------------------------------------------------------- Study data:  Comparison was made to the study of 09/16/2017.  Study status:  Routine.  Procedure:  The patient reported no pain pre or post test. Transthoracic echocardiography for left ventricular function evaluation. Image quality was adequate.  Study completion: There were no complications.          Transthoracic echocardiography.  M-mode, complete 2D, spectral Doppler, and color Doppler.  Birthdate:  Patient birthdate: 04-Aug-1951.  Age:  Patient is 72 yr old.  Sex:  Gender: female.    BMI: 26.9 kg/m^2.  Blood pressure:     128/76  Patient status:  Outpatient.  Study date: Study date: 11/12/2017. Study time: 10:44 AM.  Location:  [facility]  -------------------------------------------------------------------  ------------------------------------------------------------------- Left ventricle:  The cavity size was normal. There was mild concentric hypertrophy. Systolic function was normal. The estimated ejection fraction was in the range of 55% to 60%. Wall motion was normal; there were no regional wall motion abnormalities. Doppler parameters are consistent with abnormal left ventricular relaxation (grade 1 diastolic dysfunction).  ------------------------------------------------------------------- Aortic valve:   Trileaflet; mildly thickened, mildly calcified leaflets. Mobility was not restricted.  Doppler:  Transvalvular velocity was within the normal range. There was no stenosis. There was no regurgitation.  ------------------------------------------------------------------- Aorta:  Aortic root: The aortic root was normal in size.  ------------------------------------------------------------------- Mitral valve:   Structurally normal valve.   Mobility was not restricted.  Doppler:  Transvalvular velocity was within the normal range. There was no evidence for stenosis. There was trivial regurgitation.  ------------------------------------------------------------------- Left atrium:  The atrium was normal in size.  ------------------------------------------------------------------- Right ventricle:  The cavity size was normal. Wall thickness was normal. Systolic function was normal.  ------------------------------------------------------------------- Pulmonic valve:   Poorly visualized.  Structurally normal valve. Cusp separation was normal.  Doppler:  Transvalvular velocity was within the normal range. There was no evidence for stenosis. There was no regurgitation.  ------------------------------------------------------------------- Tricuspid valve:   Structurally normal valve.    Doppler: Transvalvular  velocity was within the normal range. There was no regurgitation.  ------------------------------------------------------------------- Pulmonary artery:   The main pulmonary artery was normal-sized. Systolic pressure was within the normal range.  ------------------------------------------------------------------- Right atrium:  The atrium was normal in size.  ------------------------------------------------------------------- Pericardium:  There was no pericardial effusion.  -------------------------------------------------------------------  Systemic veins: Inferior vena cava: The vessel was normal in size.  ------------------------------------------------------------------- Measurements  Left ventricle                           Value        Reference LV ID, ED, PLAX chordal          (L)     41.7  mm     43 - 52 LV ID, ES, PLAX chordal                  25.5  mm     23 - 38 LV fx shortening, PLAX chordal           39    %      >=29 LV PW thickness, ED                      12.2  mm     --------- IVS/LV PW ratio, ED                      0.96         <=1.3 Stroke volume, 2D                        82    ml     --------- Stroke volume/bsa, 2D                    41    ml/m^2 --------- LV ejection fraction, 1-p A4C            57    %      --------- LV end-diastolic volume, 2-p             73    ml     --------- LV end-systolic volume, 2-p              31    ml     --------- LV ejection fraction, 2-p                58    %      --------- Stroke volume, 2-p                       42    ml     --------- LV end-diastolic volume/bsa, 2-p         36    ml/m^2 --------- LV end-systolic volume/bsa, 2-p          15    ml/m^2 --------- Stroke volume/bsa, 2-p                   20.8  ml/m^2 --------- LV e&', lateral                           6.92  cm/s   --------- LV E/e&', lateral                         8.63         --------- LV e&', medial                            4.68  cm/s   --------- LV  E/e&', medial  12.76        --------- LV e&', average                           5.8   cm/s   --------- LV E/e&', average                         10.29        ---------  Ventricular septum                       Value        Reference IVS thickness, ED                        11.7  mm     ---------  LVOT                                     Value        Reference LVOT ID, S                               21    mm     --------- LVOT area                                3.46  cm^2   --------- LVOT ID                                  21    mm     --------- LVOT peak velocity, S                    117   cm/s   --------- LVOT mean velocity, S                    68    cm/s   --------- LVOT VTI, S                              23.7  cm     --------- LVOT peak gradient, S                    5     mm Hg  --------- Stroke volume (SV), LVOT DP              82.1  ml     --------- Stroke index (SV/bsa), LVOT DP           40.6  ml/m^2 ---------  Aorta                                    Value        Reference Aortic root ID, ED                       30    mm     --------- Ascending aorta ID, A-P, S  29    mm     ---------  Left atrium                              Value        Reference LA ID, A-P, ES                           34    mm     --------- LA ID/bsa, A-P                           1.68  cm/m^2 <=2.2 LA volume, S                             50    ml     --------- LA volume/bsa, S                         24.8  ml/m^2 --------- LA volume, ES, 1-p A4C                   44    ml     --------- LA volume/bsa, ES, 1-p A4C               21.8  ml/m^2 --------- LA volume, ES, 1-p A2C                   51    ml     --------- LA volume/bsa, ES, 1-p A2C               25.3  ml/m^2 ---------  Mitral valve                             Value        Reference Mitral E-wave peak velocity              59.7  cm/s   --------- Mitral A-wave peak velocity              66.1  cm/s    --------- Mitral deceleration time         (H)     299   ms     150 - 230 Mitral E/A ratio, peak                   0.9          ---------  Systemic veins                           Value        Reference Estimated CVP                            3     mm Hg  ---------  Right ventricle                          Value        Reference RV s&', lateral, S                        12    cm/s   ---------  Legend: (L)  and  (H)  mark values outside specified reference range.  ------------------------------------------------------------------- Prepared and Electronically Authenticated by  Donato Schultz, M.D. 2018-11-16T14:29:14             Current Reported Medications:.    Current Meds  Medication Sig   acetaminophen (TYLENOL) 500 MG tablet Take 1,000 mg by mouth every 6 (six) hours as needed (pain).   aspirin EC 81 MG tablet Take 81 mg by mouth daily.   [DISCONTINUED] amLODipine (NORVASC) 5 MG tablet Take 1 tablet (5 mg total) by mouth daily. Please keep upcoming Dec appt for further refills. Thank you   [DISCONTINUED] atorvastatin (LIPITOR) 40 MG tablet TAKE 1 TABLET(40 MG) BY MOUTH DAILY. Please keep upcoming Dec appt for further refills. Thank you   [DISCONTINUED] carvedilol (COREG) 3.125 MG tablet Take 1 tablet (3.125 mg total) by mouth 2 (two) times daily with a meal.   Current Facility-Administered Medications for the 12/27/23 encounter (Office Visit) with Reather Littler D, NP  Medication   sodium chloride flush (NS) 0.9 % injection 3 mL   Physical Exam:    VS:  BP 131/82 (BP Location: Left Arm, Patient Position: Sitting)   Pulse 65   Ht 5\' 9"  (1.753 m)   Wt 178 lb (80.7 kg)   SpO2 95%   BMI 26.29 kg/m    Wt Readings from Last 3 Encounters:  12/27/23 178 lb (80.7 kg)  12/08/22 174 lb 9.6 oz (79.2 kg)  06/04/22 183 lb 12.8 oz (83.4 kg)    GEN: Well nourished, well developed in no acute distress NECK: No JVD; No carotid bruits CARDIAC: RRR, no murmurs, rubs,  gallops RESPIRATORY:  Clear to auscultation without rales, wheezing or rhonchi  ABDOMEN: Soft, non-tender, non-distended EXTREMITIES:  No edema; No acute deformity   Asessement and Plan:.    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  Heart catheterization showed no evidence of atherosclerotic CAD.  Felt to be caused by a coronary vasospasm as T wave inversion resolved on follow-up EKG. Today she denies any further chest discomfort, stable with no anginal symptoms. No indication for ischemic evaluation.  Heart healthy diet and regular cardiovascular exercise encouraged.  Continue aspirin, Lipitor, amlodipine and carvedilol.  Refills of her medications provided. She has not required sublingual nitroglycerin since her last appointment.  Hypertension: Blood pressure today 131/82. Continue amlodipine and carvedilol.  Hyperlipidemia: Last lipid profile on 06/04/2022 indicated total cholesterol 138, HDL 43, triglycerides 111 and LDL 75.  Check fasting lipid profile and LFTs.  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  Angiogram in 04/2021 showed complete recovery. Today she appears euvolemic and well compensated on exam.    Disposition: F/u with Dr. Royann Shivers in one year.   Signed, Rip Harbour, NP

## 2023-12-27 ENCOUNTER — Ambulatory Visit: Payer: Medicare PPO | Attending: Cardiology | Admitting: Cardiology

## 2023-12-27 ENCOUNTER — Encounter: Payer: Self-pay | Admitting: Cardiology

## 2023-12-27 VITALS — BP 131/82 | HR 65 | Ht 69.0 in | Wt 178.0 lb

## 2023-12-27 DIAGNOSIS — I5181 Takotsubo syndrome: Secondary | ICD-10-CM

## 2023-12-27 DIAGNOSIS — E785 Hyperlipidemia, unspecified: Secondary | ICD-10-CM

## 2023-12-27 DIAGNOSIS — I10 Essential (primary) hypertension: Secondary | ICD-10-CM

## 2023-12-27 DIAGNOSIS — I25111 Atherosclerotic heart disease of native coronary artery with angina pectoris with documented spasm: Secondary | ICD-10-CM | POA: Diagnosis not present

## 2023-12-27 DIAGNOSIS — I2510 Atherosclerotic heart disease of native coronary artery without angina pectoris: Secondary | ICD-10-CM

## 2023-12-27 DIAGNOSIS — I201 Angina pectoris with documented spasm: Secondary | ICD-10-CM

## 2023-12-27 MED ORDER — ATORVASTATIN CALCIUM 40 MG PO TABS: ORAL_TABLET | ORAL | 3 refills | Status: DC

## 2023-12-27 MED ORDER — CARVEDILOL 3.125 MG PO TABS: 3.1250 mg | ORAL_TABLET | Freq: Two times a day (BID) | ORAL | 3 refills | Status: DC

## 2023-12-27 MED ORDER — AMLODIPINE BESYLATE 5 MG PO TABS: 5.0000 mg | ORAL_TABLET | Freq: Every day | ORAL | 3 refills | Status: DC

## 2023-12-27 NOTE — Patient Instructions (Signed)
Medication Instructions:  No changes *If you need a refill on your cardiac medications before your next appointment, please call your pharmacy*  Lab Work: Within the next month we would like for you to have fasting LFT and Lipid Profile If you have labs (blood work) drawn today and your tests are completely normal, you will receive your results only by: MyChart Message (if you have MyChart) OR A paper copy in the mail If you have any lab test that is abnormal or we need to change your treatment, we will call you to review the results.  Testing/Procedures: No testing  Follow-Up: At [facility] you and your health needs are our priority.  As part of our continuing mission to provide you with exceptional heart care, we have created designated Provider Care Teams.  These Care Teams include your primary Cardiologist (physician) and Advanced Practice Providers (APPs -  Physician Assistants and Nurse Practitioners) who all work together to provide you with the care you need, when you need it.  We recommend signing up for the patient portal called "MyChart".  Sign up information is provided on this After Visit Summary.  MyChart is used to connect with patients for Virtual Visits (Telemedicine).  Patients are able to view lab/test results, encounter notes, upcoming appointments, etc.  Non-urgent messages can be sent to your provider as well.   To learn more about what you can do with MyChart, go to [email or web address].    Your next appointment:   1 year(s)  Provider:   [name], MD

## 2023-12-31 LAB — LIPID PANEL
Chol/HDL Ratio: 3.1 {ratio} (ref 0.0–4.4)
Cholesterol, Total: 119 mg/dL (ref 100–199)
HDL: 39 mg/dL — ABNORMAL LOW (ref >39)
LDL Chol Calc (NIH): 62 mg/dL (ref 0–99)
Triglycerides: 96 mg/dL (ref 0–149)
VLDL Cholesterol Cal: 18 mg/dL (ref 5–40)

## 2023-12-31 LAB — HEPATIC FUNCTION PANEL
ALT: 27 [IU]/L (ref 0–32)
AST: 18 [IU]/L (ref 0–40)
Albumin: 4.3 g/dL (ref 3.8–4.8)
Alkaline Phosphatase: 141 [IU]/L — ABNORMAL HIGH (ref 44–121)
Bilirubin Total: 0.9 mg/dL (ref 0.0–1.2)
Bilirubin, Direct: 0.34 mg/dL (ref 0.00–0.40)
Total Protein: 7.1 g/dL (ref 6.0–8.5)

## 2024-01-04 ENCOUNTER — Telehealth: Payer: Self-pay

## 2024-01-04 NOTE — Telephone Encounter (Signed)
 Called patient advised of below they verbalized understanding.

## 2024-01-04 NOTE — Telephone Encounter (Signed)
-----   Message from Katlyn D West sent at 01/02/2024  4:16 PM EST ----- Please let Brenda Lloyd know that her LDL is well controlled at 62. Her alkaline phosphatase is mildly elevated, we will monitor this in the future. Her liver function is otherwise normal. Good results! Continue current medications.

## 2024-12-24 NOTE — Progress Notes (Unsigned)
 "  Cardiology Office Note    Date:  12/26/2024  ID:  Brenda, Lloyd Oct 23, 1951, MRN [id number] PCP:  Joeann Meiers, MD (Inactive)  Cardiologist:  Jerel Balding, MD  Electrophysiologist:  None   Chief Complaint: Follow up for hx of NSTEMI   History of Present Illness: .   Brenda Lloyd is a 73 y.o. 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  She underwent cardiac catheterization at that time that indicated an incidental finding of a 65% stenosis in distal LAD.  Her ejection fraction was as low as 25 to 35%.  Her follow-up echocardiogram in November 2018 showed complete recovery with a left ventricular ejection fraction of 55 to 60%.   On a follow-up visit in May 2022 she had vague complaints of chest discomfort, her ECG showed striking T wave inversion across the anterior precordium with very deep symmetrical T wave inversion.  Heart catheterization on 05/08/2021 showed no evidence of atherosclerotic CAD, just a tapering distal LAD without significant focal stenosis.  Left ventricular regional wall motion, overall EF and filling pressures were all normal.  ECG changes normalized on follow-up in August 2022, amlodipine  was added and increased for presumed coronary vasospasm.  She was seen in clinic on 12/08/2022 by Dr. Balding.  She had remained stable from a cardiac perspective, since starting on amlodipine  she had not had any episodes of vague chest discomfort.  Patient was last seen in clinic on 12/27/2023 for follow-up.  She remained stable from a cardiac standpoint.  Today she presents for follow-up.  She reports that she has been doing well overall.  She denies any chest pain, shortness of breath, lower extremity edema, orthopnea or PND.  She denies any palpitations, presyncope or syncope.  Patient denies any cardiac concerns or complaints today.  She does note that she is interested in  working towards weight loss, will discuss possibility with PharmD team.  ROS: .   Today she denies chest pain, shortness of breath, lower extremity edema, fatigue, palpitations, melena, hematuria, hemoptysis, diaphoresis, weakness, presyncope, syncope, orthopnea, and PND.  All other systems are reviewed and otherwise negative. Studies Reviewed: SABRA   EKG:  EKG is ordered today, personally reviewed, demonstrating  EKG Interpretation Date/Time:  Tuesday December 26 2024 08:41:59 EST Ventricular Rate:  61 PR Interval:  188 QRS Duration:  84 QT Interval:  422 QTC Calculation: 424 R Axis:   -7  Text Interpretation: Normal sinus rhythm Normal ECG When compared with ECG of 27-Dec-2023 09:22, No significant change was found Confirmed by Allisyn Kunz [phone]) on 12/26/2024 8:55:17 AM   CV Studies: Cardiac studies reviewed are outlined and summarized above. Otherwise please see EMR for full report. Cardiac Studies & Procedures   ______________________________________________________________________________________________ CARDIAC CATHETERIZATION  CARDIAC CATHETERIZATION 05/08/2021  Conclusion Conclusions: 1. No angiographically significant coronary artery disease.  Distal LAD tapers to a small vessel as it nears the apex without focal stenosis. 2. Normal left ventricular contraction with mildly elevated filling pressure.  Recommendations: 1. Continue medical therapy, including addition of recently added carvedilol .  If symptoms persist, gentle diuresis may also be helpful in the setting of elevated left ventricular filling pressure and preserved LVEF.  Lonni Hanson, MD [facility]  Findings Coronary Findings Diagnostic  Dominance: Right  Left Anterior Descending Vessel is large. Vessel is angiographically normal. The distal LAD tapers to a small vessel as it courses to the apex without focal stenosis.  First  Diagonal Branch Vessel is large in size.  Left Circumflex Vessel is  large. Vessel is angiographically normal.  First Obtuse Marginal Branch Vessel is small in size.  Second Obtuse Marginal Branch Vessel is moderate in size.  Third Obtuse Marginal Branch Vessel is moderate in size.  Right Coronary Artery Vessel is moderate in size. Vessel is angiographically normal.  Right Posterior Descending Artery Vessel is small in size.  Right Posterior Atrioventricular Artery Vessel is small in size.  First Right Posterolateral Branch Vessel is small in size.  Second Right Posterolateral Branch Vessel is small in size.  Intervention  No interventions have been documented.   CARDIAC CATHETERIZATION  CARDIAC CATHETERIZATION 09/05/2017  Conclusion Images from the original result were not included.   Dist LAD lesion, 65 %stenosed. Diffuse tapering from the mid vessel distally around the apex. The vessel has the appearance of potentially diffuse intramural thrombus.  There is moderate to severe left ventricular systolic dysfunction. The left ventricular ejection fraction is 25-35% by visual estimate. - In a Takotsubo pattern  LV end diastolic pressure is moderately elevated. 22 mmHg  There is moderate (3+) mitral regurgitation.  There is no aortic valve stenosis.  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 There is pruning of the septal branches and area that should be covered with with a diagonal branch is empty.  The patient was somewhat hypotensive due to sedation in the Cath Lab, but does not have the appearance of being a cardiac shock. Her blood pressures were mostly in the 110s. I did start low-dose Levophed  to allow for sedation as she became agitated with the ventilator in place.  The patient was transferred back to the CCU for supportive care. ABG looked excellent. I suspect that she  would benefit from at least one day of rest and potentially another dose of IV Lasix  depending on her pressures.  As her pressure tolerate, we can wean off Levophed  and potentially consider adding either amlodipine  or Imdur for her definitive diffuse LAD disease.   Alm Clay, M.D., M.S. Interventional Cardiologist  Pager # [phone] Phone # [phone] [address] [place], [place] [address]  Findings Coronary Findings Diagnostic  Dominance: Right  Left Main Vessel is large. Vessel is angiographically normal.  Left Anterior Descending The lesion is spasm and smooth. Has appearance of either diffuse spasm or intramural thrombus.  First Diagonal Branch Vessel is angiographically normal.  Lateral First Diagonal Branch Vessel is small in size.  First Septal Branch Vessel is small in size.  Second Diagonal Branch Vessel is small in size.  Second Septal Branch Vessel is small in size. Small pruned-appearing  Third Septal Branch Vessel is small in size. Pruned appearing  Left Circumflex Vessel is angiographically normal.  First Obtuse Marginal Branch Vessel is angiographically normal.  Right Coronary Artery Vessel is large. Vessel is angiographically normal.  Acute Marginal Branch Vessel is small in size.  Right Posterior Descending Artery Vessel is moderate in size. Vessel is angiographically normal.  Inferior Septal Vessel is small in size.  Right Posterior Atrioventricular Artery Vessel is moderate in size. Vessel is angiographically normal.  First Right Posterolateral Branch Vessel is small in size. Vessel is angiographically normal.  Second Right Posterolateral Branch Vessel is small in size. Vessel is angiographically normal.  Third Right Posterolateral Branch Vessel is small in size. Vessel is angiographically normal.  Intervention  No interventions have been documented.     ECHOCARDIOGRAM  ECHOCARDIOGRAM COMPLETE  11/12/2017  Narrative *[facility]* [address] [place], [place] [address] [phone]  ------------------------------------------------------------------- Transthoracic Echocardiography  Patient:    Ailea, Rhatigan MR #:       [id number] Study Date: 11/12/2017 Gender:     F Age:        68 Height:     175.3 cm Weight:     82.6 kg BSA:        2.02 m^2 Pt. Status: Room:  ATTENDING    Maxie Herlene MARLA TISA Maxie Herlene K REFERRING    Maxie Herlene K SONOGRAPHER  Celestia, Will PERFORMING   [facility], Outpatient  cc:  ------------------------------------------------------------------- LV EF: 55% -   60%  ------------------------------------------------------------------- Indications:      (I51.81).  ------------------------------------------------------------------- History:   PMH:  Takotsubo syndrome. Acquired from the patient and from the patient&'s chart.  Coronary artery disease.  ------------------------------------------------------------------- Study Conclusions  - Left ventricle: The cavity size was normal. There was mild concentric hypertrophy. Systolic function was normal. The estimated ejection fraction was in the range of 55% to 60%. Wall motion was normal; there were no regional wall motion abnormalities. Doppler parameters are consistent with abnormal left ventricular relaxation (grade 1 diastolic dysfunction). - Aortic valve: Trileaflet; mildly thickened, mildly calcified leaflets.  Impressions:  - EF has returned to normal.  ------------------------------------------------------------------- Study data:  Comparison was made to the study of 09/16/2017.  Study status:  Routine.  Procedure:  The patient reported no pain pre or post test. Transthoracic echocardiography for left ventricular function evaluation. Image quality was adequate.  Study completion: There were no complications.          Transthoracic echocardiography.  M-mode,  complete 2D, spectral Doppler, and color Doppler.  Birthdate:  Patient birthdate: 1951-09-27.  Age:  Patient is 73 yr old.  Sex:  Gender: female.    BMI: 26.9 kg/m^2.  Blood pressure:     128/76  Patient status:  Outpatient.  Study date: Study date: 11/12/2017. Study time: 10:44 AM.  Location:  [facility]  -------------------------------------------------------------------  ------------------------------------------------------------------- Left ventricle:  The cavity size was normal. There was mild concentric hypertrophy. Systolic function was normal. The estimated ejection fraction was in the range of 55% to 60%. Wall motion was normal; there were no regional wall motion abnormalities. Doppler parameters are consistent with abnormal left ventricular relaxation (grade 1 diastolic dysfunction).  ------------------------------------------------------------------- Aortic valve:   Trileaflet; mildly thickened, mildly calcified leaflets. Mobility was not restricted.  Doppler:  Transvalvular velocity was within the normal range. There was no stenosis. There was no regurgitation.  ------------------------------------------------------------------- Aorta:  Aortic root: The aortic root was normal in size.  ------------------------------------------------------------------- Mitral valve:   Structurally normal valve.   Mobility was not restricted.  Doppler:  Transvalvular velocity was within the normal range. There was no evidence for stenosis. There was trivial regurgitation.  ------------------------------------------------------------------- Left atrium:  The atrium was normal in size.  ------------------------------------------------------------------- Right ventricle:  The cavity size was normal. Wall thickness was normal. Systolic function was normal.  ------------------------------------------------------------------- Pulmonic valve:   Poorly visualized.  Structurally  normal valve. Cusp separation was normal.  Doppler:  Transvalvular velocity was within the normal range. There was no evidence for stenosis. There was no regurgitation.  ------------------------------------------------------------------- Tricuspid valve:   Structurally normal valve.    Doppler: Transvalvular velocity was within the normal range. There was no regurgitation.  -------------------------------------------------------------------  Pulmonary artery:   The main pulmonary artery was normal-sized. Systolic pressure was within the normal range.  ------------------------------------------------------------------- Right atrium:  The atrium was normal in size.  ------------------------------------------------------------------- Pericardium:  There was no pericardial effusion.  ------------------------------------------------------------------- Systemic veins: Inferior vena cava: The vessel was normal in size.  ------------------------------------------------------------------- Measurements  Left ventricle                           Value        Reference LV ID, ED, PLAX chordal          (L)     41.7  mm     43 - 52 LV ID, ES, PLAX chordal                  25.5  mm     23 - 38 LV fx shortening, PLAX chordal           39    %      >=29 LV PW thickness, ED                      12.2  mm     --------- IVS/LV PW ratio, ED                      0.96         <=1.3 Stroke volume, 2D                        82    ml     --------- Stroke volume/bsa, 2D                    41    ml/m^2 --------- LV ejection fraction, 1-p A4C            57    %      --------- LV end-diastolic volume, 2-p             73    ml     --------- LV end-systolic volume, 2-p              31    ml     --------- LV ejection fraction, 2-p                58    %      --------- Stroke volume, 2-p                       42    ml     --------- LV end-diastolic volume/bsa, 2-p         36    ml/m^2 --------- LV end-systolic  volume/bsa, 2-p          15    ml/m^2 --------- Stroke volume/bsa, 2-p                   20.8  ml/m^2 --------- LV e&', lateral                           6.92  cm/s   --------- LV E/e&', lateral                         8.63         --------- LV e&', medial  4.68  cm/s   --------- LV E/e&', medial                          12.76        --------- LV e&', average                           5.8   cm/s   --------- LV E/e&', average                         10.29        ---------  Ventricular septum                       Value        Reference IVS thickness, ED                        11.7  mm     ---------  LVOT                                     Value        Reference LVOT ID, S                               21    mm     --------- LVOT area                                3.46  cm^2   --------- LVOT ID                                  21    mm     --------- LVOT peak velocity, S                    117   cm/s   --------- LVOT mean velocity, S                    68    cm/s   --------- LVOT VTI, S                              23.7  cm     --------- LVOT peak gradient, S                    5     mm Hg  --------- Stroke volume (SV), LVOT DP              82.1  ml     --------- Stroke index (SV/bsa), LVOT DP           40.6  ml/m^2 ---------  Aorta                                    Value        Reference Aortic root ID, ED  30    mm     --------- Ascending aorta ID, A-P, S               29    mm     ---------  Left atrium                              Value        Reference LA ID, A-P, ES                           34    mm     --------- LA ID/bsa, A-P                           1.68  cm/m^2 <=2.2 LA volume, S                             50    ml     --------- LA volume/bsa, S                         24.8  ml/m^2 --------- LA volume, ES, 1-p A4C                   44    ml     --------- LA volume/bsa, ES, 1-p A4C               21.8  ml/m^2 --------- LA  volume, ES, 1-p A2C                   51    ml     --------- LA volume/bsa, ES, 1-p A2C               25.3  ml/m^2 ---------  Mitral valve                             Value        Reference Mitral E-wave peak velocity              59.7  cm/s   --------- Mitral A-wave peak velocity              66.1  cm/s   --------- Mitral deceleration time         (H)     299   ms     150 - 230 Mitral E/A ratio, peak                   0.9          ---------  Systemic veins                           Value        Reference Estimated CVP                            3     mm Hg  ---------  Right ventricle                          Value        Reference RV s&', lateral,  S                        12    cm/s   ---------  Legend: (L)  and  (H)  mark values outside specified reference range.  ------------------------------------------------------------------- Prepared and Electronically Authenticated by  Oneil Parchment, M.D. 2018-11-16T14:29:14          ______________________________________________________________________________________________       Current Reported Medications:.    Active Medications[1]  Physical Exam:    VS:  BP 134/68   Pulse 61   Ht 5' 9 (1.753 m)   Wt 190 lb 9.6 oz (86.5 kg)   SpO2 99%   BMI 28.15 kg/m    Wt Readings from Last 3 Encounters:  12/26/24 190 lb 9.6 oz (86.5 kg)  12/27/23 178 lb (80.7 kg)  12/08/22 174 lb 9.6 oz (79.2 kg)    GEN: Well nourished, well developed in no acute distress NECK: No JVD; No carotid bruits CARDIAC: RRR, no murmurs, rubs, gallops RESPIRATORY:  Clear to auscultation without rales, wheezing or rhonchi  ABDOMEN: Soft, non-tender, non-distended EXTREMITIES:  No edema; No acute deformity     Asessement and Plan:.    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 Heart catheterization  showed no evidence of atherosclerotic CAD. Felt to be caused by a coronary vasospasm as T wave inversion resolved on follow-up EKG. EKG today shows normal sinus rhythm with no significant changes. Stable with no anginal symptoms. No indication for ischemic evaluation.  Heart healthy diet and regular cardiovascular exercise encouraged.  Reviewed ED precautions. Continue amlodipine  5 mg daily, aspirin  81 mg daily, Lipitor  40 mg daily and carvedilol  3.125 mg twice daily. Check CBC, CMET and fasting lipid profile.   Hypertension: Blood pressure today 134/68.  Continue amlodipine  5 mg daily and carvedilol  3.125 mg twice daily.  Hyperlipidemia: Check fasting lipid profile and LFTs today.  Pending results continue atorvastatin  40 mg daily.  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 Angiogram in 04/2021 showed complete recovery.    Disposition: F/u with Isack Lavalley, NP in six months per patient request.   Signed, Aarish Rockers D Wilder Kurowski, NP       [1]  Current Meds  Medication Sig   acetaminophen  (TYLENOL ) 500 MG tablet Take 1,000 mg by mouth every 6 (six) hours as needed (pain).   amLODipine  (NORVASC ) 5 MG tablet Take 1 tablet (5 mg total) by mouth daily. Please keep upcoming Dec appt for further refills. Thank you   aspirin  EC 81 MG tablet Take 81 mg by mouth daily.   atorvastatin  (LIPITOR ) 40 MG tablet TAKE 1 TABLET(40 MG) BY MOUTH DAILY. Please keep upcoming Dec appt for further refills. Thank you   carvedilol  (COREG ) 3.125 MG tablet Take 1 tablet (3.125 mg total) by mouth 2 (two) times daily with a meal.   nitroGLYCERIN  (NITROSTAT ) 0.4 MG SL tablet Place 1 tablet (0.4 mg total) under the tongue every 5 (five) minutes as needed for chest pain.   Current Facility-Administered Medications for the 12/26/24 encounter (Office Visit) with Siddh Vandeventer D, NP   Medication   sodium chloride  flush (NS) 0.9 % injection 3 mL   "

## 2024-12-26 ENCOUNTER — Ambulatory Visit: Admitting: Cardiology

## 2024-12-26 ENCOUNTER — Encounter: Payer: Self-pay | Admitting: Cardiology

## 2024-12-26 VITALS — BP 134/68 | HR 61 | Ht 69.0 in | Wt 190.6 lb

## 2024-12-26 DIAGNOSIS — I10 Essential (primary) hypertension: Secondary | ICD-10-CM | POA: Diagnosis not present

## 2024-12-26 DIAGNOSIS — I201 Angina pectoris with documented spasm: Secondary | ICD-10-CM

## 2024-12-26 DIAGNOSIS — I5181 Takotsubo syndrome: Secondary | ICD-10-CM

## 2024-12-26 DIAGNOSIS — E785 Hyperlipidemia, unspecified: Secondary | ICD-10-CM

## 2024-12-26 MED ORDER — ATORVASTATIN CALCIUM 40 MG PO TABS: ORAL_TABLET | ORAL | 3 refills | Status: AC

## 2024-12-26 MED ORDER — AMLODIPINE BESYLATE 5 MG PO TABS: 5.0000 mg | ORAL_TABLET | Freq: Every day | ORAL | 3 refills | Status: AC

## 2024-12-26 MED ORDER — NITROGLYCERIN 0.4 MG SL SUBL: 0.4000 mg | SUBLINGUAL_TABLET | SUBLINGUAL | 2 refills | Status: AC | PRN

## 2024-12-26 NOTE — Patient Instructions (Signed)
 Medication Instructions:  Your physician recommends that you continue on your current medications as directed. Please refer to the Current Medication list given to you today.  *If you need a refill on your cardiac medications before your next appointment, please call your pharmacy*  Lab Work: TODAY: CMET, Lipids, CBC  If you have labs (blood work) drawn today and your tests are completely normal, you will receive your results only by: MyChart Message (if you have MyChart) OR A paper copy in the mail If you have any lab test that is abnormal or we need to change your treatment, we will call you to review the results.  Testing/Procedures: NONE  Follow-Up: At [facility], you and your health needs are our priority.  As part of our continuing mission to provide you with exceptional heart care, our providers are all part of one team.  This team includes your primary Cardiologist (physician) and Advanced Practice Providers or APPs (Physician Assistants and Nurse Practitioners) who all work together to provide you with the care you need, when you need it.  Your next appointment:   6 month(s)  Provider:   Katlyn West, NP   We recommend signing up for the patient portal called MyChart.  Sign up information is provided on this After Visit Summary.  MyChart is used to connect with patients for Virtual Visits (Telemedicine).  Patients are able to view lab/test results, encounter notes, upcoming appointments, etc.  Non-urgent messages can be sent to your provider as well.   To learn more about what you can do with MyChart, go to [email or web address].

## 2024-12-27 ENCOUNTER — Ambulatory Visit: Payer: Self-pay | Admitting: Cardiology

## 2024-12-27 LAB — LIPID PANEL
Chol/HDL Ratio: 3 ratio (ref 0.0–4.4)
Cholesterol, Total: 130 mg/dL (ref 100–199)
HDL: 43 mg/dL
LDL Chol Calc (NIH): 68 mg/dL (ref 0–99)
Triglycerides: 103 mg/dL (ref 0–149)
VLDL Cholesterol Cal: 19 mg/dL (ref 5–40)

## 2024-12-27 LAB — COMPREHENSIVE METABOLIC PANEL WITH GFR
ALT: 42 IU/L — ABNORMAL HIGH (ref 0–32)
AST: 28 IU/L (ref 0–40)
Albumin: 4.2 g/dL (ref 3.8–4.8)
Alkaline Phosphatase: 125 IU/L (ref 49–135)
BUN/Creatinine Ratio: 19 (ref 12–28)
BUN: 12 mg/dL (ref 8–27)
Bilirubin Total: 0.9 mg/dL (ref 0.0–1.2)
CO2: 23 mmol/L (ref 20–29)
Calcium: 9.9 mg/dL (ref 8.7–10.3)
Chloride: 105 mmol/L (ref 96–106)
Creatinine, Ser: 0.64 mg/dL (ref 0.57–1.00)
Globulin, Total: 2.8 g/dL (ref 1.5–4.5)
Glucose: 130 mg/dL — ABNORMAL HIGH (ref 70–99)
Potassium: 4.8 mmol/L (ref 3.5–5.2)
Sodium: 144 mmol/L (ref 134–144)
Total Protein: 7 g/dL (ref 6.0–8.5)
eGFR: 93 mL/min/1.73

## 2024-12-27 LAB — CBC
Hematocrit: 40.5 % (ref 34.0–46.6)
Hemoglobin: 13.4 g/dL (ref 11.1–15.9)
MCH: 28.8 pg (ref 26.6–33.0)
MCHC: 33.1 g/dL (ref 31.5–35.7)
MCV: 87 fL (ref 79–97)
Platelets: 408 x10E3/uL (ref 150–450)
RBC: 4.66 x10E6/uL (ref 3.77–5.28)
RDW: 12.7 % (ref 11.7–15.4)
WBC: 8.4 x10E3/uL (ref 3.4–10.8)

## 2024-12-27 NOTE — Telephone Encounter (Signed)
 Patient returned staff call regarding results.

## 2024-12-29 ENCOUNTER — Other Ambulatory Visit: Payer: Self-pay | Admitting: Cardiovascular Disease

## 2024-12-29 NOTE — Telephone Encounter (Signed)
 Pt's medication was sent to pt's pharmacy as requested. Confirmation received.
